# Patient Record
Sex: Female | Born: 1985 | Race: Black or African American | Hispanic: No | Smoking: Never smoker
Health system: Southern US, Community
[De-identification: ages and names within clinical notes are randomized; demographics above are authoritative.]

## PROBLEM LIST (undated history)

## (undated) DIAGNOSIS — N979 Female infertility, unspecified: Secondary | ICD-10-CM

## (undated) DIAGNOSIS — Z862 Personal history of diseases of the blood and blood-forming organs and certain disorders involving the immune mechanism: Secondary | ICD-10-CM

## (undated) DIAGNOSIS — E669 Obesity, unspecified: Secondary | ICD-10-CM

## (undated) DIAGNOSIS — E559 Vitamin D deficiency, unspecified: Secondary | ICD-10-CM

## (undated) DIAGNOSIS — T7840XA Allergy, unspecified, initial encounter: Secondary | ICD-10-CM

## (undated) DIAGNOSIS — L309 Dermatitis, unspecified: Secondary | ICD-10-CM

## (undated) HISTORY — DX: Obesity, unspecified: E66.9

## (undated) HISTORY — PX: TOOTH EXTRACTION: SUR596

## (undated) HISTORY — DX: Vitamin D deficiency, unspecified: E55.9

## (undated) HISTORY — DX: Dermatitis, unspecified: L30.9

## (undated) HISTORY — DX: Allergy, unspecified, initial encounter: T78.40XA

## (undated) HISTORY — DX: Personal history of diseases of the blood and blood-forming organs and certain disorders involving the immune mechanism: Z86.2

## (undated) HISTORY — DX: Female infertility, unspecified: N97.9

---

## 2004-02-28 ENCOUNTER — Ambulatory Visit (HOSPITAL_COMMUNITY): Admission: RE | Admit: 2004-02-28 | Discharge: 2004-02-28 | Payer: Self-pay | Admitting: Internal Medicine

## 2004-10-12 ENCOUNTER — Inpatient Hospital Stay (HOSPITAL_COMMUNITY): Admission: AD | Admit: 2004-10-12 | Discharge: 2004-10-12 | Payer: Self-pay | Admitting: Obstetrics & Gynecology

## 2005-04-05 ENCOUNTER — Inpatient Hospital Stay (HOSPITAL_COMMUNITY): Admission: AD | Admit: 2005-04-05 | Discharge: 2005-04-05 | Payer: Self-pay | Admitting: Obstetrics & Gynecology

## 2006-04-12 ENCOUNTER — Emergency Department (HOSPITAL_COMMUNITY): Admission: EM | Admit: 2006-04-12 | Discharge: 2006-04-12 | Payer: Self-pay | Admitting: Emergency Medicine

## 2006-09-12 ENCOUNTER — Emergency Department (HOSPITAL_COMMUNITY): Admission: EM | Admit: 2006-09-12 | Discharge: 2006-09-12 | Payer: Self-pay | Admitting: Family Medicine

## 2007-01-23 ENCOUNTER — Emergency Department (HOSPITAL_COMMUNITY): Admission: EM | Admit: 2007-01-23 | Discharge: 2007-01-23 | Payer: Self-pay | Admitting: Family Medicine

## 2008-02-28 ENCOUNTER — Emergency Department (HOSPITAL_COMMUNITY): Admission: EM | Admit: 2008-02-28 | Discharge: 2008-02-29 | Payer: Self-pay | Admitting: Emergency Medicine

## 2008-05-21 ENCOUNTER — Emergency Department (HOSPITAL_COMMUNITY): Admission: EM | Admit: 2008-05-21 | Discharge: 2008-05-21 | Payer: Self-pay | Admitting: Emergency Medicine

## 2008-10-14 ENCOUNTER — Emergency Department (HOSPITAL_COMMUNITY): Admission: EM | Admit: 2008-10-14 | Discharge: 2008-10-14 | Payer: Self-pay | Admitting: Family Medicine

## 2008-12-23 ENCOUNTER — Emergency Department (HOSPITAL_COMMUNITY): Admission: EM | Admit: 2008-12-23 | Discharge: 2008-12-23 | Payer: Self-pay | Admitting: Emergency Medicine

## 2009-03-28 ENCOUNTER — Emergency Department (HOSPITAL_COMMUNITY): Admission: EM | Admit: 2009-03-28 | Discharge: 2009-03-28 | Payer: Self-pay | Admitting: Emergency Medicine

## 2009-06-29 ENCOUNTER — Emergency Department (HOSPITAL_COMMUNITY): Admission: EM | Admit: 2009-06-29 | Discharge: 2009-06-29 | Payer: Self-pay | Admitting: Emergency Medicine

## 2009-07-10 ENCOUNTER — Inpatient Hospital Stay (HOSPITAL_COMMUNITY): Admission: AD | Admit: 2009-07-10 | Discharge: 2009-07-10 | Payer: Self-pay | Admitting: Obstetrics and Gynecology

## 2009-07-22 ENCOUNTER — Inpatient Hospital Stay (HOSPITAL_COMMUNITY): Admission: AD | Admit: 2009-07-22 | Discharge: 2009-07-23 | Payer: Self-pay | Admitting: Obstetrics and Gynecology

## 2009-08-27 ENCOUNTER — Inpatient Hospital Stay (HOSPITAL_COMMUNITY): Admission: AD | Admit: 2009-08-27 | Discharge: 2009-09-01 | Payer: Self-pay | Admitting: Obstetrics and Gynecology

## 2010-08-30 LAB — CBC
HCT: 30.8 % — ABNORMAL LOW (ref 36.0–46.0)
HCT: 36.5 % (ref 36.0–46.0)
Hemoglobin: 12 g/dL (ref 12.0–15.0)
MCHC: 33.2 g/dL (ref 30.0–36.0)
MCV: 82.9 fL (ref 78.0–100.0)
Platelets: 201 10*3/uL (ref 150–400)
RBC: 4.43 MIL/uL (ref 3.87–5.11)
RDW: 14.8 % (ref 11.5–15.5)
RDW: 14.8 % (ref 11.5–15.5)
WBC: 17.7 10*3/uL — ABNORMAL HIGH (ref 4.0–10.5)

## 2010-08-30 LAB — RPR: RPR Ser Ql: NONREACTIVE

## 2010-09-13 LAB — POCT URINALYSIS DIP (DEVICE)
Glucose, UA: NEGATIVE mg/dL
Hgb urine dipstick: NEGATIVE

## 2010-10-08 ENCOUNTER — Emergency Department (HOSPITAL_COMMUNITY)
Admission: EM | Admit: 2010-10-08 | Discharge: 2010-10-08 | Disposition: A | Discharge status: Home / Self Care | Payer: Self-pay | Attending: Emergency Medicine | Admitting: Emergency Medicine

## 2010-10-08 ENCOUNTER — Emergency Department (HOSPITAL_COMMUNITY): Payer: Self-pay

## 2010-10-08 DIAGNOSIS — M25539 Pain in unspecified wrist: Secondary | ICD-10-CM | POA: Insufficient documentation

## 2010-10-08 DIAGNOSIS — M25439 Effusion, unspecified wrist: Secondary | ICD-10-CM | POA: Insufficient documentation

## 2011-03-08 LAB — COMPREHENSIVE METABOLIC PANEL
AST: 17
Alkaline Phosphatase: 58
Chloride: 103
Creatinine, Ser: 0.84
GFR calc Af Amer: >60
Potassium: 3.3 — ABNORMAL LOW
Sodium: 137
Total Bilirubin: 1

## 2011-03-08 LAB — CBC
Hemoglobin: 12.6
MCHC: 33
MCV: 85.1
RDW: 14.7

## 2011-03-08 LAB — WET PREP, GENITAL
Clue Cells Wet Prep HPF POC: NONE SEEN
Yeast Wet Prep HPF POC: NONE SEEN

## 2011-03-08 LAB — DIFFERENTIAL
Basophils Absolute: 0
Basophils Relative: 0
Lymphocytes Relative: 10 — ABNORMAL LOW
Monocytes Absolute: 0.8
Monocytes Relative: 12
Neutro Abs: 4.9

## 2011-03-08 LAB — URINALYSIS, ROUTINE W REFLEX MICROSCOPIC
Bilirubin Urine: NEGATIVE
Glucose, UA: NEGATIVE
Hgb urine dipstick: NEGATIVE
Ketones, ur: NEGATIVE
Protein, ur: NEGATIVE
Specific Gravity, Urine: 1.025
pH: 6

## 2011-03-08 LAB — GC/CHLAMYDIA PROBE AMP, GENITAL: Chlamydia, DNA Probe: NEGATIVE

## 2011-03-12 LAB — DIFFERENTIAL
Basophils Absolute: 0 K/uL (ref 0.0–0.1)
Basophils Relative: 1 % (ref 0–1)
Eosinophils Absolute: 0 K/uL (ref 0.0–0.7)
Eosinophils Relative: 1 % (ref 0–5)
Lymphocytes Relative: 10 % — ABNORMAL LOW (ref 12–46)
Lymphs Abs: 0.7 10*3/uL (ref 0.7–4.0)
Monocytes Absolute: 0.4 K/uL (ref 0.1–1.0)
Monocytes Relative: 6 % (ref 3–12)
Neutro Abs: 5.6 10*3/uL (ref 1.7–7.7)
Neutrophils Relative %: 83 % — ABNORMAL HIGH (ref 43–77)

## 2011-03-12 LAB — COMPREHENSIVE METABOLIC PANEL WITH GFR
AST: 22 U/L (ref 0–37)
Albumin: 4 g/dL (ref 3.5–5.2)
BUN: 6 mg/dL (ref 6–23)
Calcium: 9.2 mg/dL (ref 8.4–10.5)
Creatinine, Ser: 0.89 mg/dL (ref 0.4–1.2)
GFR calc non Af Amer: >60 mL/min (ref >60)
Glucose, Bld: 100 mg/dL — ABNORMAL HIGH (ref 70–99)
Potassium: 3.6 meq/L (ref 3.5–5.1)
Total Bilirubin: 1.1 mg/dL (ref 0.3–1.2)
Total Protein: 7.6 g/dL (ref 6.0–8.3)

## 2011-03-12 LAB — URINALYSIS, ROUTINE W REFLEX MICROSCOPIC
Bilirubin Urine: NEGATIVE
Glucose, UA: NEGATIVE mg/dL
Hgb urine dipstick: NEGATIVE
Ketones, ur: 15 mg/dL — AB
Nitrite: NEGATIVE
Protein, ur: NEGATIVE mg/dL
Specific Gravity, Urine: 1.026 (ref 1.005–1.030)
Urobilinogen, UA: 1 mg/dL (ref 0.0–1.0)
pH: 6.5 (ref 5.0–8.0)

## 2011-03-12 LAB — CBC
HCT: 39 % (ref 36.0–46.0)
Hemoglobin: 12.8 g/dL (ref 12.0–15.0)
MCHC: 32.7 g/dL (ref 30.0–36.0)
MCV: 83.5 fL (ref 78.0–100.0)
Platelets: 294 10*3/uL (ref 150–400)
RBC: 4.67 MIL/uL (ref 3.87–5.11)
RDW: 14.6 % (ref 11.5–15.5)
WBC: 6.7 10*3/uL (ref 4.0–10.5)

## 2011-03-12 LAB — COMPREHENSIVE METABOLIC PANEL
ALT: 25 U/L (ref 0–35)
Alkaline Phosphatase: 65 U/L (ref 39–117)
CO2: 27 mEq/L (ref 19–32)
Chloride: 104 mEq/L (ref 96–112)
GFR calc Af Amer: >60 mL/min (ref >60)
Sodium: 139 mEq/L (ref 135–145)

## 2011-03-12 LAB — POCT PREGNANCY, URINE: Preg Test, Ur: NEGATIVE

## 2011-03-12 LAB — LIPASE, BLOOD: Lipase: 25 U/L (ref 11–59)

## 2011-03-19 LAB — POCT URINALYSIS DIP (DEVICE)
Bilirubin Urine: NEGATIVE
Hgb urine dipstick: NEGATIVE
Ketones, ur: NEGATIVE
Specific Gravity, Urine: 1.02
pH: 7

## 2011-03-19 LAB — WET PREP, GENITAL
Trich, Wet Prep: NONE SEEN
Yeast Wet Prep HPF POC: NONE SEEN

## 2011-03-19 LAB — POCT PREGNANCY, URINE: Operator id: 116391

## 2011-03-19 LAB — GC/CHLAMYDIA PROBE AMP, GENITAL: GC Probe Amp, Genital: NEGATIVE

## 2012-03-21 ENCOUNTER — Encounter (HOSPITAL_COMMUNITY): Payer: Self-pay | Admitting: Emergency Medicine

## 2012-03-21 ENCOUNTER — Emergency Department (HOSPITAL_COMMUNITY)
Admission: EM | Admit: 2012-03-21 | Discharge: 2012-03-21 | Disposition: A | Discharge status: Home / Self Care | Payer: Self-pay | Attending: Emergency Medicine | Admitting: Emergency Medicine

## 2012-03-21 ENCOUNTER — Emergency Department (HOSPITAL_COMMUNITY): Payer: Self-pay

## 2012-03-21 DIAGNOSIS — R109 Unspecified abdominal pain: Secondary | ICD-10-CM | POA: Insufficient documentation

## 2012-03-21 DIAGNOSIS — R1010 Upper abdominal pain, unspecified: Secondary | ICD-10-CM

## 2012-03-21 DIAGNOSIS — R197 Diarrhea, unspecified: Secondary | ICD-10-CM | POA: Insufficient documentation

## 2012-03-21 DIAGNOSIS — R112 Nausea with vomiting, unspecified: Secondary | ICD-10-CM | POA: Insufficient documentation

## 2012-03-21 DIAGNOSIS — R10816 Epigastric abdominal tenderness: Secondary | ICD-10-CM | POA: Insufficient documentation

## 2012-03-21 LAB — CBC WITH DIFFERENTIAL/PLATELET
Basophils Absolute: 0 K/uL (ref 0.0–0.1)
Basophils Relative: 0 % (ref 0–1)
Eosinophils Absolute: 0.1 K/uL (ref 0.0–0.7)
Eosinophils Relative: 1 % (ref 0–5)
HCT: 40.9 % (ref 36.0–46.0)
Hemoglobin: 13.8 g/dL (ref 12.0–15.0)
Lymphocytes Relative: 12 % (ref 12–46)
Lymphs Abs: 1.6 K/uL (ref 0.7–4.0)
MCH: 27 pg (ref 26.0–34.0)
MCHC: 33.7 g/dL (ref 30.0–36.0)
MCV: 79.9 fL (ref 78.0–100.0)
Monocytes Absolute: 0.7 K/uL (ref 0.1–1.0)
Monocytes Relative: 5 % (ref 3–12)
Neutro Abs: 11.1 K/uL — ABNORMAL HIGH (ref 1.7–7.7)
Neutrophils Relative %: 82 % — ABNORMAL HIGH (ref 43–77)
Platelets: 327 K/uL (ref 150–400)
RBC: 5.12 MIL/uL — ABNORMAL HIGH (ref 3.87–5.11)
RDW: 13.8 % (ref 11.5–15.5)
WBC: 13.5 K/uL — ABNORMAL HIGH (ref 4.0–10.5)

## 2012-03-21 LAB — COMPREHENSIVE METABOLIC PANEL
Albumin: 4.3 g/dL (ref 3.5–5.2)
Alkaline Phosphatase: 84 U/L (ref 39–117)
BUN: 8 mg/dL (ref 6–23)
Calcium: 9.9 mg/dL (ref 8.4–10.5)
Creatinine, Ser: 0.84 mg/dL (ref 0.50–1.10)
GFR calc Af Amer: >90 mL/min (ref >90)
Glucose, Bld: 96 mg/dL (ref 70–99)
Total Protein: 8.7 g/dL — ABNORMAL HIGH (ref 6.0–8.3)

## 2012-03-21 LAB — URINALYSIS, ROUTINE W REFLEX MICROSCOPIC
Glucose, UA: NEGATIVE mg/dL
Ketones, ur: NEGATIVE mg/dL
Leukocytes, UA: NEGATIVE
Nitrite: NEGATIVE
pH: 5.5 (ref 5.0–8.0)

## 2012-03-21 LAB — URINE MICROSCOPIC-ADD ON

## 2012-03-21 MED ORDER — ONDANSETRON HCL 4 MG PO TABS: 4.0000 mg | ORAL_TABLET | Freq: Three times a day (TID) | ORAL | Status: DC | PRN

## 2012-03-21 NOTE — ED Provider Notes (Signed)
10:29 PM Patient to move to CDU following abdominal US to await results.  Pt with two weeks of N/V/D.  Holding pending [place].  If negative, patient may be discharged home with PCP follow up.    10:49 PM Patient reports she is feeling much better.  States her abdominal pain is now 3/10, much improved.  She is no longer nauseated.  Reports "my butt hurts" from having diarrhea.  Labs show leukocytosis, otherwise unremarkable.  UA with many squamous cells, 0-2WBC, few bacteria, 30 protein.    11:11 PM Reexamination of abdomen: soft, nondistended, diffuse upper abdominal pain, no guarding, no rebound.  Pt to be d/c home with zofran, PCP follow up.    Pt verbalizes understanding and agrees with plan.   Pt given return precautions.   Results for orders placed during the hospital encounter of 03/21/12  CBC WITH DIFFERENTIAL      Component Value Range   WBC 13.5 (*) 4.0 - 10.5 K/uL   RBC 5.12 (*) 3.87 - 5.11 MIL/uL   Hemoglobin 13.8  12.0 - 15.0 g/dL   HCT 95.2  84.1 - 32.4 %   MCV 79.9  78.0 - 100.0 fL   MCH 27.0  26.0 - 34.0 pg   MCHC 33.7  30.0 - 36.0 g/dL   RDW 40.1  02.7 - 25.3 %   Platelets 327  150 - 400 K/uL   Neutrophils Relative 82 (*) 43 - 77 %   Neutro Abs 11.1 (*) 1.7 - 7.7 K/uL   Lymphocytes Relative 12  12 - 46 %   Lymphs Abs 1.6  0.7 - 4.0 K/uL   Monocytes Relative 5  3 - 12 %   Monocytes Absolute 0.7  0.1 - 1.0 K/uL   Eosinophils Relative 1  0 - 5 %   Eosinophils Absolute 0.1  0.0 - 0.7 K/uL   Basophils Relative 0  0 - 1 %   Basophils Absolute 0.0  0.0 - 0.1 K/uL  COMPREHENSIVE METABOLIC PANEL      Component Value Range   Sodium 137  135 - 145 mEq/L   Potassium 3.7  3.5 - 5.1 mEq/L   Chloride 102  96 - 112 mEq/L   CO2 26  19 - 32 mEq/L   Glucose, Bld 96  70 - 99 mg/dL   BUN 8  6 - 23 mg/dL   Creatinine, Ser 6.64  0.50 - 1.10 mg/dL   Calcium 9.9  8.4 - 40.3 mg/dL   Total Protein 8.7 (*) 6.0 - 8.3 g/dL   Albumin 4.3  3.5 - 5.2 g/dL   AST 16  0 - 37 U/L   ALT 14  0 - 35 U/L    Alkaline Phosphatase 84  39 - 117 U/L   Total Bilirubin 0.7  0.3 - 1.2 mg/dL   GFR calc non Af Amer >90  >90 mL/min   GFR calc Af Amer >90  >90 mL/min  URINALYSIS, ROUTINE W REFLEX MICROSCOPIC      Component Value Range   Color, Urine YELLOW  YELLOW   APPearance CLEAR  CLEAR   Specific Gravity, Urine 1.034 (*) 1.005 - 1.030   pH 5.5  5.0 - 8.0   Glucose, UA NEGATIVE  NEGATIVE mg/dL   Hgb urine dipstick NEGATIVE  NEGATIVE   Bilirubin Urine NEGATIVE  NEGATIVE   Ketones, ur NEGATIVE  NEGATIVE mg/dL   Protein, ur 30 (*) NEGATIVE mg/dL   Urobilinogen, UA 0.2  0.0 - 1.0 mg/dL  Nitrite NEGATIVE  NEGATIVE   Leukocytes, UA NEGATIVE  NEGATIVE  URINE MICROSCOPIC-ADD ON      Component Value Range   Squamous Epithelial / LPF MANY (*) RARE   WBC, UA 0-2  <3 WBC/hpf   Bacteria, UA FEW (*) RARE   Urine-Other MUCOUS PRESENT     US Abdomen Complete  03/21/2012  *RADIOLOGY REPORT*  Clinical Data:  Abdominal pain.  COMPLETE ABDOMINAL ULTRASOUND  Comparison:  CT abdomen and pelvis 02/28/2008  Findings:  Gallbladder:  No gallstones, gallbladder wall thickening, or pericholecystic fluid.  Common bile duct:  Common bile duct is not visualized due to overlying bowel gas.  No intrahepatic bile duct dilatation is appreciated.  Liver:  No focal lesion identified.  Within normal limits in parenchymal echogenicity.  IVC:  Appears normal.  Pancreas:  Pancreas is not visualized due to overlying bowel gas.  Spleen:  Spleen length measures 11.2 cm.  Normal parenchymal echotexture.  Right Kidney:  Right kidney measures 11.5 cm length.  No hydronephrosis.  Left Kidney:  Left kidney measures 11.6 cm length.  No hydronephrosis.  Abdominal aorta:  No aneurysm identified.  IMPRESSION: Limited visualization of common bile duct and pancreas due to overlying bowel gas.  Visualized upper abdominal structures are unremarkable.   Original Report Authenticated By: Marlon Pel, M.D.       [place], [place] 03/21/12 2312

## 2012-03-21 NOTE — ED Provider Notes (Signed)
History     CSN: [id number]  Arrival date & time 03/21/12  1722   First MD Initiated Contact with Patient 03/21/12 2028      Chief Complaint  Patient presents with  . Diarrhea  . Nausea  . Emesis    (Consider location/radiation/quality/duration/timing/severity/associated sxs/prior treatment) HPI Comments: Patient presents with n/v/d for the past 1-2 weeks.  She states she is having upper abd pain and is having a difficult time keeping anything down.  There are no ill contacts.  She denies having had prior abd surgeries with the exception of a c-section.    Patient is a 26 y.o. female presenting with diarrhea. The history is provided by the patient.  Diarrhea The primary symptoms include abdominal pain, nausea, vomiting and diarrhea. Primary symptoms do not include fever or dysuria. Episode onset: 1-2 weeks ago. The problem has been gradually worsening.  The illness does not include chills or constipation.    History reviewed. No pertinent past medical history.  Past Surgical History  Procedure Date  . Cesarean section     No family history on file.  History  Substance Use Topics  . Smoking status: Never Smoker   . Smokeless tobacco: Not on file  . Alcohol Use: No    OB History    Grav Para Term Preterm Abortions TAB SAB Ect Mult Living                  Review of Systems  Constitutional: Negative for fever and chills.  Gastrointestinal: Positive for nausea, vomiting, abdominal pain and diarrhea. Negative for constipation.  Genitourinary: Negative for dysuria.  All other systems reviewed and are negative.    Allergies  Review of patient's allergies indicates no known allergies.  Home Medications  No current outpatient prescriptions on file.  BP 123/88  Pulse 107  Temp 98.4 F (36.9 C) (Oral)  Resp 16  SpO2 99%  LMP 03/08/2012  Physical Exam  Nursing note and vitals reviewed. Constitutional: She is oriented to person, place, and time. She appears  well-developed and well-nourished. No distress.  HENT:  Head: Normocephalic and atraumatic.  Neck: Normal range of motion. Neck supple.  Cardiovascular: Normal rate and regular rhythm.  Exam reveals no gallop and no friction rub.   No murmur heard. Pulmonary/Chest: Effort normal and breath sounds normal. No respiratory distress. She has no wheezes.  Abdominal: Soft. Bowel sounds are normal. She exhibits no distension. There is no tenderness.       There is mild ttp in the epigastrium without rebound or guarding.    Musculoskeletal: Normal range of motion.  Neurological: She is alert and oriented to person, place, and time.  Skin: Skin is warm and dry. She is not diaphoretic.    ED Course  Procedures (including critical care time)  Labs Reviewed  CBC WITH DIFFERENTIAL - Abnormal; Notable for the following:    WBC 13.5 (*)     RBC 5.12 (*)     Neutrophils Relative 82 (*)     Neutro Abs 11.1 (*)     All other components within normal limits  COMPREHENSIVE METABOLIC PANEL - Abnormal; Notable for the following:    Total Protein 8.7 (*)     All other components within normal limits  URINALYSIS, ROUTINE W REFLEX MICROSCOPIC - Abnormal; Notable for the following:    Specific Gravity, Urine 1.034 (*)     Protein, ur 30 (*)     All other components within normal limits  URINE  MICROSCOPIC-ADD ON - Abnormal; Notable for the following:    Squamous Epithelial / LPF MANY (*)     Bacteria, UA FEW (*)     All other components within normal limits   No results found.   No diagnosis found.    MDM  The patient presents here with epigastric abd pain.  Her labs and vitals are unremarkable.  An [place] was ordered and the patient was sent to the cdu awaiting the [place] results.  The provider in the cdu will obtain the results.  If normal she will be discharged to home.        Geoffery Lyons, MD 03/22/12 [phone]

## 2012-03-21 NOTE — ED Notes (Signed)
Pt states she has had diarrhea with nausea and vomiting for past two weeks.  Denies abdominal pain.  States has had some dizziness.

## 2012-03-22 NOTE — ED Provider Notes (Signed)
Medical screening examination/treatment/procedure(s) were performed by non-physician practitioner and as supervising physician I was immediately available for consultation/collaboration.  Geoffery Lyons, MD 03/22/12 [phone]

## 2012-06-27 ENCOUNTER — Ambulatory Visit (INDEPENDENT_AMBULATORY_CARE_PROVIDER_SITE_OTHER): Payer: Medicaid Other | Admitting: Family Medicine

## 2012-06-27 ENCOUNTER — Encounter: Payer: Self-pay | Admitting: Family Medicine

## 2012-06-27 VITALS — BP 110/77 | HR 89 | Temp 98.2°F | Ht 64.5 in | Wt 250.0 lb

## 2012-06-27 DIAGNOSIS — E669 Obesity, unspecified: Secondary | ICD-10-CM | POA: Insufficient documentation

## 2012-06-27 DIAGNOSIS — Z7689 Persons encountering health services in other specified circumstances: Secondary | ICD-10-CM

## 2012-06-27 DIAGNOSIS — Z01419 Encounter for gynecological examination (general) (routine) without abnormal findings: Secondary | ICD-10-CM | POA: Insufficient documentation

## 2012-06-27 DIAGNOSIS — Z7189 Other specified counseling: Secondary | ICD-10-CM

## 2012-06-27 NOTE — Assessment & Plan Note (Signed)
Reviewed PMHX, SurHx, FamHx, SocHx.  Patient is due for pap/CPE, and would like Mirena removed, will have her come in at he convenience for Well Woman Exam.

## 2012-06-27 NOTE — Progress Notes (Signed)
  Subjective:    Patient ID: Lauren Powell, female    DOB: Aug 14, 1985, 27 y.o.   MRN: [id number]  HPI  Krista comes in to establish care.  She has no specific complaints.  She is getting married February 22, is not currently sexually active.  She and her fiance want to try to get pregnant as soon as they get married, so she is planning on having her IUD out. She has not had a pap since her first pregnancy 3 years ago.   She says [facility] is [facility], doing some aerobics classes, and walking a lot.  She has made some dietary changes as well.    Past Medical History  Diagnosis Date  . Allergy   . Eczema    Family History  Problem Relation Age of Onset  . Hypertension Paternal Aunt   . Hypertension Paternal Grandmother    History   Social History  . Marital Status: Single    Spouse Name: N/A    Number of Children: N/A  . Years of Education: N/A   Occupational History  . Not on file.   Social History Main Topics  . Smoking status: Never Smoker   . Smokeless tobacco: Never Used  . Alcohol Use: No  . Drug Use: No  . Sexually Active: Not Currently    Birth Control/ Protection: IUD   Other Topics Concern  . 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    Review of Systems 12-point ROS negative except stated in HPI    Objective:   Physical Exam BP 110/77  Pulse 89  Temp 98.2 F (36.8 C)  Ht 5' 4.5" (1.638 m)  Wt 250 lb (113.399 kg)  BMI 42.25 kg/m2  LMP 06/13/2012 General appearance: alert, cooperative, no distress and moderately obese Neck: no adenopathy, supple, symmetrical, trachea midline and thyroid not enlarged, symmetric, no tenderness/mass/nodules Lungs: clear to auscultation bilaterally Heart: regular rate and rhythm, S1, S2 normal, no murmur, click, rub or gallop Extremities: extremities normal, atraumatic, no cyanosis or edema Pulses: 2+ and  symmetric      Assessment & Plan:

## 2012-06-27 NOTE — Assessment & Plan Note (Signed)
Discussed dietary changes, gave a few suggestions for changes to make.  Offered nutrition counseling if she is interested.

## 2012-06-27 NOTE — Patient Instructions (Signed)
It was nice to meet you.  Your blood pressure today was BP: 110/77 mmHg.  Remember your goal blood pressure is about 130/80.  Please be sure to take your medication every day.    To help you work on improving your nutrition, remember the plate rule for each meal:  - 1/4 of the plate or less should be a whole grain starch (brown rice, whole grain pasta, wheat bread, etc.)  - 1/4 of the plate should be a lean source of protein (chicken, [place], fish, beans, egg whites).  - 1/2 the plate or more should be fruits and vegetables - the more vegetables the better!

## 2012-07-07 ENCOUNTER — Encounter: Payer: Medicaid Other | Admitting: Family Medicine

## 2012-07-18 ENCOUNTER — Other Ambulatory Visit (HOSPITAL_COMMUNITY)
Admission: RE | Admit: 2012-07-18 | Discharge: 2012-07-18 | Disposition: A | Discharge status: Home / Self Care | Payer: Medicaid Other | Source: Ambulatory Visit | Attending: Family Medicine | Admitting: Family Medicine

## 2012-07-18 ENCOUNTER — Encounter: Payer: Self-pay | Admitting: Family Medicine

## 2012-07-18 ENCOUNTER — Ambulatory Visit (INDEPENDENT_AMBULATORY_CARE_PROVIDER_SITE_OTHER): Payer: Medicaid Other | Admitting: Family Medicine

## 2012-07-18 VITALS — BP 118/79 | HR 79 | Temp 98.0°F | Ht 64.5 in | Wt 249.0 lb

## 2012-07-18 DIAGNOSIS — Z309 Encounter for contraceptive management, unspecified: Secondary | ICD-10-CM

## 2012-07-18 DIAGNOSIS — M79609 Pain in unspecified limb: Secondary | ICD-10-CM

## 2012-07-18 DIAGNOSIS — E669 Obesity, unspecified: Secondary | ICD-10-CM

## 2012-07-18 DIAGNOSIS — Z124 Encounter for screening for malignant neoplasm of cervix: Secondary | ICD-10-CM

## 2012-07-18 DIAGNOSIS — Z01419 Encounter for gynecological examination (general) (routine) without abnormal findings: Secondary | ICD-10-CM

## 2012-07-18 DIAGNOSIS — M79671 Pain in right foot: Secondary | ICD-10-CM | POA: Insufficient documentation

## 2012-07-18 MED ORDER — SPENCO GEL HEEL CUP MED/LG MISC: 1.0000 | Freq: Once | Status: DC

## 2012-07-18 MED ORDER — TRIAMCINOLONE ACETONIDE 0.025 % EX OINT: TOPICAL_OINTMENT | Freq: Two times a day (BID) | CUTANEOUS | Status: DC

## 2012-07-18 MED ORDER — MELOXICAM 15 MG PO TABS: 15.0000 mg | ORAL_TABLET | Freq: Every day | ORAL | Status: DC

## 2012-07-18 NOTE — Assessment & Plan Note (Signed)
Encouraged her to continue to make lifestyle changes- offered nutrition counseling.

## 2012-07-18 NOTE — Assessment & Plan Note (Signed)
Likely plantar fascitis,  Rx for heel cups, gave hand out from sports medicine patient advisor with home exercise program.  rx for Mobic and advised to ice BID. F/u in one month if not improving.

## 2012-07-18 NOTE — Assessment & Plan Note (Signed)
Pap done, IUD removed, discussed importance of exercise, prenatal vitamin, continuing to abstain from smoking, drugs, alcohol when trying to become pregnant.

## 2012-07-18 NOTE — Patient Instructions (Signed)
It was good to see you today.  Congratulations on your up coming marriage!  I will send you a letter with your lab results, or call you if anything is abnormal.    For the heel pain, please take the meloxicam once daily with food for one to two weeks, and then as needed.  I also suggest icing your heel for 20 minutes at least twice a day.  Also, please see the attached hand out with heel stretches and exercises.

## 2012-07-18 NOTE — Progress Notes (Signed)
  Subjective:    Patient ID: Lauren Powell, female    DOB: 1986/01/12, 27 y.o.   MRN: [id number]  HPI  [place] comes in for her well woman exam, and to have her IUD removed.  She is getting married and wants to become pregnant.  She has not had intercourse in 3 years since her son was born, and has no worries about STD's.  She is due for a pap.  She has never had an abnormal pap.   Obesity- has been trying to watch diet, and get exercise but stressed with wedding planning.    Heel pain- has been going on since October, hurts on and off, first step out of bed, and at the end of a work shift.  She wears tennis shoes to work.  She has not had an injury, not tried any treatment for this.   Past Medical History  Diagnosis Date  . Allergy   . Eczema    Family History  Problem Relation Age of Onset  . Hypertension Paternal Aunt   . Hypertension Paternal Grandmother    History  Substance Use Topics  . Smoking status: Never Smoker   . Smokeless tobacco: Never Used  . Alcohol Use: No    Review of Systems 12 point ROS negative except for above.     Objective:   Physical Exam BP 118/79  Pulse 79  Temp(Src) 98 F (36.7 C) (Oral)  Ht 5' 4.5" (1.638 m)  Wt 249 lb (112.946 kg)  BMI 42.1 kg/m2  LMP 06/13/2012 General appearance: alert, cooperative, no distress and moderately obese Lungs: clear to auscultation bilaterally Heart: regular rate and rhythm, S1, S2 normal, no murmur, click, rub or gallop Pelvic: cervix normal in appearance, external genitalia normal, no adnexal masses or tenderness, no cervical motion tenderness, rectovaginal septum normal, uterus normal size, shape, and consistency, vagina normal without discharge and IUD strings easily visible.  Extremities: extremities normal, atraumatic, no cyanosis or edema Pulses: 2+ and symmetric  Procedure: IUD removal Speculum easily placed and Mirena strings visible.  Ringed forceps used to grasp strings, mirena removed without  difficulty.  Patient experienced minimal discomfort, no bleeding or complications.      Assessment & Plan:

## 2012-07-24 ENCOUNTER — Encounter: Payer: Self-pay | Admitting: Family Medicine

## 2012-09-03 ENCOUNTER — Encounter (HOSPITAL_COMMUNITY): Payer: Self-pay

## 2012-09-03 ENCOUNTER — Emergency Department (HOSPITAL_COMMUNITY)
Admission: EM | Admit: 2012-09-03 | Discharge: 2012-09-03 | Disposition: A | Discharge status: Home / Self Care | Payer: Medicaid Other | Attending: Emergency Medicine | Admitting: Emergency Medicine

## 2012-09-03 DIAGNOSIS — N926 Irregular menstruation, unspecified: Secondary | ICD-10-CM

## 2012-09-03 DIAGNOSIS — N644 Mastodynia: Secondary | ICD-10-CM | POA: Insufficient documentation

## 2012-09-03 DIAGNOSIS — Z3202 Encounter for pregnancy test, result negative: Secondary | ICD-10-CM | POA: Insufficient documentation

## 2012-09-03 DIAGNOSIS — Z872 Personal history of diseases of the skin and subcutaneous tissue: Secondary | ICD-10-CM | POA: Insufficient documentation

## 2012-09-03 NOTE — ED Provider Notes (Signed)
History  This chart was scribed for non-physician practitioner, Jaci Carrel, PA-C working with Carleene Cooper III, MD by Shari Heritage, ED Scribe. This patient was seen in room TR07C/TR07C and the patient's care was started at 2138.   CSN: [id number]  Arrival date & time 09/03/12  4098   First MD Initiated Contact with Patient 09/03/12 2138      Chief Complaint  Patient presents with  . Possible Pregnancy    The history is provided by the patient. No language interpreter was used.     HPI Comments: Lauren Powell is a 27 y.o. female who presents to the Emergency Department with concern of possible pregnancy. Patient states that her LMP was 07/31/2012 and is late. She says that her menstrual cycles are very regular. She is not using any birth control and has had unprotected sex. She also reports increased breast tenderness. Patient has taken 3 pregnancy tests at home which were all negative. She denies nausea, vaginal discharge, abdominal pain, fever, nipple discharge or diaphoresis. She has no other complaints or concerns at this time. Patient does not smoke.   Past Medical History  Diagnosis Date  . Allergy   . Eczema     Past Surgical History  Procedure Laterality Date  . Cesarean section      Family History  Problem Relation Age of Onset  . Hypertension Paternal Aunt   . Hypertension Paternal Grandmother     History  Substance Use Topics  . Smoking status: Never Smoker   . Smokeless tobacco: Never Used  . Alcohol Use: No    OB History   Grav Para Term Preterm Abortions TAB SAB Ect Mult Living                  Review of Systems A complete 10 system review of systems was obtained and all systems are negative except as noted in the HPI and PMH.   Allergies  Review of patient's allergies indicates no known allergies.  Home Medications  No current outpatient prescriptions on file.  Triage Vitals: BP 115/78  Pulse 82  Temp(Src) 96.9 F (36.1 C) (Oral)  Resp  18  SpO2 100%  LMP 07/31/2012  Physical Exam  Nursing note and vitals reviewed. Constitutional: She is oriented to person, place, and time. She appears well-developed and well-nourished. No distress.  HENT:  Head: Normocephalic and atraumatic.  Eyes: Conjunctivae and EOM are normal.  Neck: Normal range of motion.  Pulmonary/Chest: Effort normal.  Musculoskeletal: Normal range of motion.  Neurological: She is alert and oriented to person, place, and time.  Skin: Skin is warm and dry. No rash noted. She is not diaphoretic.  Psychiatric: She has a normal mood and affect. Her behavior is normal.    ED Course  Procedures (including critical care time) DIAGNOSTIC STUDIES: Oxygen Saturation is 100% on room air, normal by my interpretation.    COORDINATION OF CARE: 9:56 PM- Patient informed of current plan for treatment and evaluation and agrees with plan at this time.    Results for orders placed during the hospital encounter of 09/03/12  POCT PREGNANCY, URINE      Result Value Range   Preg Test, Ur NEGATIVE  NEGATIVE    No diagnosis found.    MDM  Late periord, neg preg. f-u w OBGYN       I personally performed the services described in this documentation, which was scribed in my presence. The recorded information has been reviewed and is accurate.  Jaci Carrel, [place] 09/03/12 2246

## 2012-09-03 NOTE — ED Notes (Signed)
Pt in stating she is concerned she is pregnant, states her cycle is always on time and it is now 5 days late, negative urine pregnancy tests at home, also c/o breast soreness and intermittent nausea, states these symptoms are similar to her last pregnancy.

## 2012-09-03 NOTE — ED Notes (Signed)
Patient presents with possible pregnancy. Reports that she is "regualr" and "never misses a beat" in regards to her menses. Patient is not on any birth control and reports having unprotected sex. LMP 07/31/12. Endorses intermittent headaches. Denies nausea, vomiting or abdominal pain.

## 2012-09-04 NOTE — ED Provider Notes (Signed)
Medical screening examination/treatment/procedure(s) were performed by non-physician practitioner and as supervising physician I was immediately available for consultation/collaboration.   Carleene Cooper III, MD 09/04/12 [phone]

## 2012-11-15 ENCOUNTER — Other Ambulatory Visit: Payer: Self-pay

## 2012-11-15 ENCOUNTER — Emergency Department (HOSPITAL_COMMUNITY)
Admission: EM | Admit: 2012-11-15 | Discharge: 2012-11-15 | Disposition: A | Discharge status: Home / Self Care | Payer: Medicaid Other | Attending: Emergency Medicine | Admitting: Emergency Medicine

## 2012-11-15 ENCOUNTER — Encounter (HOSPITAL_COMMUNITY): Payer: Self-pay | Admitting: *Deleted

## 2012-11-15 ENCOUNTER — Emergency Department (HOSPITAL_COMMUNITY): Payer: Medicaid Other

## 2012-11-15 DIAGNOSIS — Z872 Personal history of diseases of the skin and subcutaneous tissue: Secondary | ICD-10-CM | POA: Insufficient documentation

## 2012-11-15 DIAGNOSIS — Z79899 Other long term (current) drug therapy: Secondary | ICD-10-CM | POA: Insufficient documentation

## 2012-11-15 DIAGNOSIS — J309 Allergic rhinitis, unspecified: Secondary | ICD-10-CM | POA: Insufficient documentation

## 2012-11-15 DIAGNOSIS — R0789 Other chest pain: Secondary | ICD-10-CM | POA: Insufficient documentation

## 2012-11-15 DIAGNOSIS — J302 Other seasonal allergic rhinitis: Secondary | ICD-10-CM

## 2012-11-15 DIAGNOSIS — Z3202 Encounter for pregnancy test, result negative: Secondary | ICD-10-CM | POA: Insufficient documentation

## 2012-11-15 DIAGNOSIS — R079 Chest pain, unspecified: Secondary | ICD-10-CM

## 2012-11-15 LAB — CBC
HCT: 36.9 % (ref 36.0–46.0)
MCH: 26.8 pg (ref 26.0–34.0)
MCV: 80.4 fL (ref 78.0–100.0)
Platelets: 287 10*3/uL (ref 150–400)
RBC: 4.59 MIL/uL (ref 3.87–5.11)
RDW: 13.8 % (ref 11.5–15.5)
WBC: 7.2 10*3/uL (ref 4.0–10.5)

## 2012-11-15 LAB — BASIC METABOLIC PANEL
BUN: 5 mg/dL — ABNORMAL LOW (ref 6–23)
CO2: 29 mEq/L (ref 19–32)
Calcium: 9 mg/dL (ref 8.4–10.5)
Chloride: 101 mEq/L (ref 96–112)
Creatinine, Ser: 0.82 mg/dL (ref 0.50–1.10)

## 2012-11-15 MED ORDER — FAMOTIDINE 20 MG PO TABS: 20.0000 mg | ORAL_TABLET | Freq: Two times a day (BID) | ORAL | Status: DC

## 2012-11-15 MED ORDER — CETIRIZINE HCL 10 MG PO TABS: 10.0000 mg | ORAL_TABLET | Freq: Every day | ORAL | Status: DC

## 2012-11-15 NOTE — ED Notes (Signed)
Pt requesting to speak with Dr. Hyacinth Meeker.  MD notified.

## 2012-11-15 NOTE — ED Notes (Signed)
  Pt states started having mid upper chest pain that started Sunday nite while sitting.  No shortness of breath, pain with deep breathing, or long travel.  No cough.  Pain has consistent chest tightness with intermittent sharp pains

## 2012-11-15 NOTE — ED Provider Notes (Signed)
History     CSN: [id number]  Arrival date & time 11/15/12  4098   First MD Initiated Contact with Patient 11/15/12 [phone]      Chief Complaint  Patient presents with  . Chest Pain    (Consider location/radiation/quality/duration/timing/severity/associated sxs/prior treatment) HPI Comments: Central upper chest pain X 3 days - persistent, worse with deep breathing, nothing makes better.  No pain meds prior to arrival.  Worse at night - no hx of similar sx.  occaionsl sharp CP but not like this.  No SOB, no  N/v, no abd pain, no dizzy.    ACS: - no rf  PE:  - No risk factors  The history is provided by the patient.    Past Medical History  Diagnosis Date  . Allergy   . Eczema     Past Surgical History  Procedure Laterality Date  . Cesarean section      Family History  Problem Relation Age of Onset  . Hypertension Paternal Aunt   . Hypertension Paternal Grandmother     History  Substance Use Topics  . Smoking status: Never Smoker   . Smokeless tobacco: Never Used  . Alcohol Use: No    OB History   Grav Para Term Preterm Abortions TAB SAB Ect Mult Living                  Review of Systems  All other systems reviewed and are negative.    Allergies  Review of patient's allergies indicates no known allergies.  Home Medications   Current Outpatient Rx  Name  Route  Sig  Dispense  Refill  . cetirizine (ZYRTEC ALLERGY) 10 MG tablet   Oral   Take 1 tablet (10 mg total) by mouth daily.   30 tablet   1   . famotidine (PEPCID) 20 MG tablet   Oral   Take 1 tablet (20 mg total) by mouth 2 (two) times daily.   30 tablet   0     BP 109/71  Pulse 82  Temp(Src) 98 F (36.7 C) (Oral)  Resp 18  Wt 249 lb 3 oz (113.031 kg)  BMI 42.13 kg/m2  SpO2 98%  LMP 10/30/2012  Physical Exam  Nursing note and vitals reviewed. Constitutional: She appears well-developed and well-nourished. No distress.  HENT:  Head: Normocephalic and atraumatic.  Mouth/Throat:  Oropharynx is clear and moist. No oropharyngeal exudate.  Eyes: Conjunctivae and EOM are normal. Pupils are equal, round, and reactive to light. Right eye exhibits no discharge. Left eye exhibits no discharge. No scleral icterus.  Neck: Normal range of motion. Neck supple. No JVD present. No thyromegaly present.  Cardiovascular: Normal rate, regular rhythm, normal heart sounds and intact distal pulses.  Exam reveals no gallop and no friction rub.   No murmur heard. Pulmonary/Chest: Effort normal and breath sounds normal. No respiratory distress. She has no wheezes. She has no rales.  Abdominal: Soft. Bowel sounds are normal. She exhibits no distension and no mass. There is no tenderness.  Musculoskeletal: Normal range of motion. She exhibits no edema and no tenderness.  Lymphadenopathy:    She has no cervical adenopathy.  Neurological: She is alert. Coordination normal.  Skin: Skin is warm and dry. No rash noted. No erythema.  Psychiatric: She has a normal mood and affect. Her behavior is normal.    ED Course  Procedures (including critical care time)  Labs Reviewed  CBC  BASIC METABOLIC PANEL   Dg Chest 2 View  11/15/2012   *RADIOLOGY REPORT*  Clinical Data: Chest pain  CHEST - 2 VIEW  Comparison: None  Findings: Normal cardiac silhouette.  No effusion, infiltrate, or pneumothorax.  No acute osseous abnormality.  IMPRESSION: No acute cardiopulmonary process.   Original Report Authenticated By: Genevive Bi, M.D.     1. Chest pain   2. Seasonal allergies       MDM  Well appearing, no acute distress, normal EKG and normal vital signs. She has had ongoing symptoms for 4 days, this seems to be more positional and it, this is not in consistency with acute coronary syndrome or pulmonary embolism, chest x-ray is totally normal, the patient has been having increased allergy symptoms, we'll treat with Zyrtec, antacid, stable for discharge without any further testing at this  time.      ED ECG REPORT  I personally interpreted this EKG   Date: 11/15/2012   Rate: 80  Rhythm: normal sinus rhythm  QRS Axis: normal  Intervals: normal  ST/T Wave abnormalities: normal  Conduction Disutrbances:none  Narrative Interpretation:   Old EKG Reviewed: none available   Vida Roller, MD 11/15/12 [phone]

## 2012-11-15 NOTE — ED Notes (Signed)
Pressure to mid chest x 2 days. Reports worse with deep breath. Some shortness of breath, denies pain after eating. Denies known injury or recent lifting.

## 2013-04-20 ENCOUNTER — Ambulatory Visit: Payer: Medicaid Other | Admitting: Advanced Practice Midwife

## 2013-05-20 ENCOUNTER — Emergency Department (HOSPITAL_COMMUNITY): Payer: BC Managed Care – PPO

## 2013-05-20 ENCOUNTER — Encounter (HOSPITAL_COMMUNITY): Payer: Self-pay | Admitting: Emergency Medicine

## 2013-05-20 ENCOUNTER — Emergency Department (HOSPITAL_COMMUNITY)
Admission: EM | Admit: 2013-05-20 | Discharge: 2013-05-21 | Disposition: A | Discharge status: Home / Self Care | Payer: BC Managed Care – PPO | Attending: Emergency Medicine | Admitting: Emergency Medicine

## 2013-05-20 DIAGNOSIS — J209 Acute bronchitis, unspecified: Secondary | ICD-10-CM | POA: Insufficient documentation

## 2013-05-20 DIAGNOSIS — J40 Bronchitis, not specified as acute or chronic: Secondary | ICD-10-CM

## 2013-05-20 DIAGNOSIS — Z9109 Other allergy status, other than to drugs and biological substances: Secondary | ICD-10-CM | POA: Insufficient documentation

## 2013-05-20 DIAGNOSIS — J069 Acute upper respiratory infection, unspecified: Secondary | ICD-10-CM | POA: Insufficient documentation

## 2013-05-20 DIAGNOSIS — Z79899 Other long term (current) drug therapy: Secondary | ICD-10-CM | POA: Insufficient documentation

## 2013-05-20 DIAGNOSIS — B349 Viral infection, unspecified: Secondary | ICD-10-CM

## 2013-05-20 DIAGNOSIS — R197 Diarrhea, unspecified: Secondary | ICD-10-CM | POA: Insufficient documentation

## 2013-05-20 DIAGNOSIS — R51 Headache: Secondary | ICD-10-CM | POA: Insufficient documentation

## 2013-05-20 DIAGNOSIS — R509 Fever, unspecified: Secondary | ICD-10-CM | POA: Insufficient documentation

## 2013-05-20 DIAGNOSIS — H9209 Otalgia, unspecified ear: Secondary | ICD-10-CM | POA: Insufficient documentation

## 2013-05-20 DIAGNOSIS — B9789 Other viral agents as the cause of diseases classified elsewhere: Secondary | ICD-10-CM | POA: Insufficient documentation

## 2013-05-20 DIAGNOSIS — L259 Unspecified contact dermatitis, unspecified cause: Secondary | ICD-10-CM | POA: Insufficient documentation

## 2013-05-20 MED ORDER — ACETAMINOPHEN 325 MG PO TABS
650.0000 mg | ORAL_TABLET | Freq: Once | ORAL | Status: AC
  Administered 2013-05-20: 650 mg via ORAL
  Filled 2013-05-20: qty 2

## 2013-05-20 MED ORDER — ACETAMINOPHEN 325 MG PO TABS: 325.0000 mg | ORAL_TABLET | Freq: Once | ORAL | Status: DC

## 2013-05-20 MED ORDER — ALBUTEROL SULFATE HFA 108 (90 BASE) MCG/ACT IN AERS
2.0000 | INHALATION_SPRAY | Freq: Once | RESPIRATORY_TRACT | Status: AC
  Administered 2013-05-21: 2 via RESPIRATORY_TRACT
  Filled 2013-05-20: qty 6.7

## 2013-05-20 NOTE — ED Provider Notes (Signed)
CSN: [id number]     Arrival date & time 05/20/13  2126 History  This chart was scribed for non-physician practitioner Raymon Mutton, PA-C working with Richardean Canal, MD by Caryn Bee, ED Scribe. This patient was seen in room TR06C/TR06C and the patient's care was started at 10:12 PM.     Chief Complaint  Patient presents with  . Sore Throat  . Headache  . Chills   HPI HPI Comments: Lauren Powell is a 27 y.o. female who presents to the Emergency Department complaining of gradual onset cold-like symptoms that began 4 days ago. Pt reports associated headache, ear pain, chills, sore throat, nasal congestion, cough productive with yellow phlegm. Pt reported that when she blows her nose it is mainly a clear color. She also reports spitting up bright red blood when scratching her throat. Denies chest pain, leg swelling, bloody stools, abdominal pain, nausea, vomiting. Pt reports that she has been drinking a lot of tea lately, denied taking OTC medication for symptom relief. Denies recent travel, birth control. Pt is unsure of sick contacts, but works as a Runner, broadcasting/film/video. Pt did not get flu vaccine this year.    Past Medical History  Diagnosis Date  . Allergy   . Eczema    Past Surgical History  Procedure Laterality Date  . Cesarean section     Family History  Problem Relation Age of Onset  . Hypertension Paternal Aunt   . Hypertension Paternal Grandmother    History  Substance Use Topics  . Smoking status: Never Smoker   . Smokeless tobacco: Never Used  . Alcohol Use: No   OB History   Grav Para Term Preterm Abortions TAB SAB Ect Mult Living   1 1 1       1      Review of Systems  Constitutional: Positive for fever and chills.  HENT: Positive for congestion, ear pain, rhinorrhea and sore throat.   Respiratory: Positive for cough.   Cardiovascular: Negative for chest pain and leg swelling.  Gastrointestinal: Positive for diarrhea. Negative for nausea, vomiting, abdominal pain and  constipation.  Neurological: Positive for headaches.  All other systems reviewed and are negative.    Allergies  Review of patient's allergies indicates no known allergies.  Home Medications   Current Outpatient Rx  Name  Route  Sig  Dispense  Refill  . cetirizine (ZYRTEC ALLERGY) 10 MG tablet   Oral   Take 1 tablet (10 mg total) by mouth daily.   30 tablet   1   . PATADAY 0.2 % SOLN   Both Eyes   Place 1 drop into both eyes daily.          BP 119/80  Pulse 90  Temp(Src) 99.4 F (37.4 C) (Oral)  Resp 18  Wt 251 lb 3.2 oz (113.944 kg)  SpO2 99%  LMP 05/14/2013 Physical Exam  Nursing note and vitals reviewed. Constitutional: She is oriented to person, place, and time. She appears well-developed and well-nourished. No distress.  Nondiaphoretic  HENT:  Head: Normocephalic and atraumatic.  Right Ear: Tympanic membrane, external ear and ear canal normal.  Left Ear: Tympanic membrane, external ear and ear canal normal.  Mouth/Throat: Uvula is midline and oropharynx is clear and moist. No oropharyngeal exudate.  Mild erythema localized to the posterior oropharynx at bilateral tonsils. Negative swelling, exudate, petechia, lesions, sores to the posterior oropharynx and bilateral tonsils. Uvula midline, symmetrical elevation.  Eyes: EOM are normal.  Neck: Normal range of motion. Neck  supple.  Negative neck stiffness Negative nuchal rigidity Negative cervical lymphadenopathy  Cardiovascular: Normal rate, regular rhythm and normal heart sounds.   No murmur heard. Pulses:      Radial pulses are 2+ on the right side, and 2+ on the left side.  Pulmonary/Chest: Effort normal. No respiratory distress. She has no rales.  Airway intact Negative respiratory distress Patient able to speak in full sentences without difficulty Negative use of accessory muscles  Decreased breath sounds to lower lobes bilaterally  Musculoskeletal: Normal range of motion.  Lymphadenopathy:    She  has no cervical adenopathy.  Neurological: She is alert and oriented to person, place, and time. She exhibits normal muscle tone. Coordination normal.  Skin: Skin is warm and dry. She is not diaphoretic.  Psychiatric: She has a normal mood and affect. Her behavior is normal.    ED Course  Procedures (including critical care time) DIAGNOSTIC STUDIES: Oxygen Saturation is 99% on room air, normal by my interpretation.    COORDINATION OF CARE: 10:17 PM-Discussed treatment plan with pt at bedside and pt agreed to plan.   Results for orders placed during the hospital encounter of 05/20/13  RAPID STREP SCREEN      Result Value Range   Streptococcus, Group A Screen (Direct) NEGATIVE  NEGATIVE  CULTURE, GROUP A STREP      Result Value Range   Specimen Description THROAT     Special Requests ADDED 2234     Culture       Value: NO SUSPICIOUS COLONIES, CONTINUING TO HOLD     Performed at [facility]   Report Status PENDING     Dg Chest 2 View  05/20/2013   CLINICAL DATA:  Fever, dry cough, chills, headache since Wednesday, sore throat  EXAM: CHEST  2 VIEW  COMPARISON:  11/15/2012  FINDINGS: Normal heart size, mediastinal contours, and pulmonary vascularity.  Mild chronic bronchitic changes.  Lungs otherwise clear.  No pleural effusion or pneumothorax.  No acute bony abnormalities.  IMPRESSION: Mild chronic bronchitic changes without acute infiltrate.   Electronically Signed   By: Ulyses Southward M.D.   On: 05/20/2013 23:27   Labs Review Labs Reviewed  RAPID STREP SCREEN  CULTURE, GROUP A STREP   Imaging Review Dg Chest 2 View  05/20/2013   CLINICAL DATA:  Fever, dry cough, chills, headache since Wednesday, sore throat  EXAM: CHEST  2 VIEW  COMPARISON:  11/15/2012  FINDINGS: Normal heart size, mediastinal contours, and pulmonary vascularity.  Mild chronic bronchitic changes.  Lungs otherwise clear.  No pleural effusion or pneumothorax.  No acute bony abnormalities.  IMPRESSION: Mild  chronic bronchitic changes without acute infiltrate.   Electronically Signed   By: Ulyses Southward M.D.   On: 05/20/2013 23:27    EKG Interpretation   None       MDM   1. URI (upper respiratory infection)   2. Viral illness   3. Bronchitis     Medications  albuterol (PROVENTIL HFA;VENTOLIN HFA) 108 (90 BASE) MCG/ACT inhaler 2 puff (not administered)  acetaminophen (TYLENOL) tablet 650 mg (650 mg Oral Given 05/20/13 2220)   Filed Vitals:   05/20/13 2133  BP: 119/80  Pulse: 90  Temp: 99.4 F (37.4 C)  TempSrc: Oral  Resp: 18  Weight: 251 lb 3.2 oz (113.944 kg)  SpO2: 99%    I personally performed the services described in this documentation, which was scribed in my presence. The recorded information has been reviewed and is accurate.  Patient presenting to emergency department with nasal congestion, productive cough, sore throat that has been ongoing since Wednesday-4 days. Patient reports she's been drinking hot tea-denied any over-the-counter medication or home remedy. Alert oriented. GCS 15. Heart rate and rhythm normal. Radial pulses 2+ bilaterally. Lung sounds diminished to lower lobes bilaterally. Mild erythema identified to the posterior oropharynx and tonsils bilaterally with negative findings of exudate, petechia, swelling. Uvula midline, symmetrical elevation. Negative oral lesions identified. Negative nuchal rigidity, negative neck stiffness-negative cervical lymphadenopathy. Negative meningeal signs. Negative swelling or erythema, negative bulging noted to the tympanic membranes bilaterally. Full range of motion to upper and lower extremities bilaterally without difficulty noted. Rapid strep test negative. Chest x-ray noted mild bronchitic changes without acute infiltrate. Doubt pneumonia. Suspicion to be viral infection, upper respiratory infection. Patient stable, afebrile. Discharged patient. Discussed with patient to rest and stay hydrated. Discussed with patient  supportive therapy. Discharged patient with albuterol inhaler. Referred patient to primary care provider. Discussed with patient to closely monitor symptoms if symptoms are to worsen or change to report back to emergency department-strict return instructions given. Patient agreed to plan of care, understood, all questions answered.  Raymon Mutton, PA-C 05/22/13 1406

## 2013-05-20 NOTE — ED Notes (Signed)
Pt states HA with Sore throat, cough and congestion for 3 days.  Pt states chills at home.

## 2013-05-20 NOTE — ED Notes (Signed)
Pt taken to xray 

## 2013-05-22 NOTE — ED Provider Notes (Signed)
Medical screening examination/treatment/procedure(s) were performed by non-physician practitioner and as supervising physician I was immediately available for consultation/collaboration.  EKG Interpretation   None         Richardean Canal, MD 05/22/13 1435

## 2013-05-23 LAB — CULTURE, GROUP A STREP

## 2013-05-29 ENCOUNTER — Encounter: Payer: Self-pay | Admitting: Advanced Practice Midwife

## 2013-05-29 ENCOUNTER — Ambulatory Visit (INDEPENDENT_AMBULATORY_CARE_PROVIDER_SITE_OTHER): Payer: BC Managed Care – PPO | Admitting: Advanced Practice Midwife

## 2013-05-29 VITALS — BP 119/79 | HR 83 | Temp 98.1°F | Ht 64.0 in | Wt 250.0 lb

## 2013-05-29 DIAGNOSIS — Z01419 Encounter for gynecological examination (general) (routine) without abnormal findings: Secondary | ICD-10-CM

## 2013-05-29 DIAGNOSIS — Z3169 Encounter for other general counseling and advice on procreation: Secondary | ICD-10-CM

## 2013-05-29 DIAGNOSIS — Z6841 Body Mass Index (BMI) 40.0 and over, adult: Secondary | ICD-10-CM

## 2013-05-29 DIAGNOSIS — Z3202 Encounter for pregnancy test, result negative: Secondary | ICD-10-CM

## 2013-05-29 DIAGNOSIS — Z0000 Encounter for general adult medical examination without abnormal findings: Secondary | ICD-10-CM

## 2013-05-29 LAB — POCT URINALYSIS DIPSTICK
Leukocytes, UA: NEGATIVE
Urobilinogen, UA: NEGATIVE
pH, UA: 5

## 2013-05-29 LAB — POCT URINE PREGNANCY: Preg Test, Ur: NEGATIVE

## 2013-05-29 NOTE — Progress Notes (Signed)
New patient in office today for annual gyn visit, denies any concerns at this time Subjective:     Lauren Powell is a 27 y.o. female here for a routine exam.  Current complaints: denies.  Personal health questionnaire reviewed: yes.  Patient had a normal pap 07/2012. Here today to establish care and conception counseling visit.  Patient is in a monogamous relationship. Denies risk of STI. Denies any GYN concerns.   Gynecologic History Patient's last menstrual period was 05/14/2013. Contraception: none Last Pap: 06/2012. Results were: normal Last mammogram: N/A Obstetric History OB History  Gravida Para Term Preterm AB SAB TAB Ectopic Multiple Living  1 1 1       1     # Outcome Date GA Lbr Len/2nd Weight Sex Delivery Anes PTL Lv  1 TRM                The following portions of the patient's history were reviewed and updated as appropriate: allergies, current medications, past family history, past medical history, past social history, past surgical history and problem list.  Review of Systems A comprehensive review of systems was negative.    Objective:    BP 119/79  Pulse 83  Temp(Src) 98.1 F (36.7 C)  Ht 5\' 4"  (1.626 m)  Wt 250 lb (113.399 kg)  BMI 42.89 kg/m2  LMP 05/14/2013 General appearance: alert and cooperative Head: Normocephalic, without obvious abnormality, atraumatic Eyes: conjunctivae/corneas clear. PERRL, EOM's intact. Fundi benign. Ears: normal TM's and external ear canals both ears Nose: Nares normal. Septum midline. Mucosa normal. No drainage or sinus tenderness. Throat: lips, mucosa, and tongue normal; teeth and gums normal Neck: no adenopathy, no carotid bruit, no JVD, supple, symmetrical, trachea midline and thyroid not enlarged, symmetric, no tenderness/mass/nodules Back: symmetric, no curvature. ROM normal. No CVA tenderness. Lungs: clear to auscultation bilaterally and normal percussion bilaterally Breasts: normal appearance, no masses or  tenderness Heart: regular rate and rhythm, S1, S2 normal, no murmur, click, rub or gallop Abdomen: soft, non-tender; bowel sounds normal; no masses,  no organomegaly Extremities: extremities normal, atraumatic, no cyanosis or edema Skin: Skin color, texture, turgor normal. No rashes or lesions Lymph nodes: Cervical, supraclavicular, and axillary nodes normal. Neurologic: Alert and oriented X 3, normal strength and tone. Normal symmetric reflexes. Normal coordination and gait    Assessment:    Healthy female exam.   Elevated BMI Contraception Counseling Not preventing pregnancy H/O c-section, candidate for VBAC  Plan:    Education reviewed: calcium supplements, low fat, low cholesterol diet, safe sex/STD prevention, self breast exams, skin cancer screening and weight bearing exercise. Follow up in: [facility]   Reviewed benefits of diet and exercise TSH and HgbA1C today Patient to take PNV Plan pap 07/2015  40 min spent with patient greater than 80% spent in counseling and coordination of care.

## 2013-06-05 ENCOUNTER — Telehealth: Payer: Self-pay | Admitting: *Deleted

## 2013-06-06 ENCOUNTER — Encounter: Payer: Self-pay | Admitting: Advanced Practice Midwife

## 2013-06-26 ENCOUNTER — Encounter: Payer: Self-pay | Admitting: Family

## 2013-06-26 ENCOUNTER — Ambulatory Visit (INDEPENDENT_AMBULATORY_CARE_PROVIDER_SITE_OTHER): Payer: BC Managed Care – PPO | Admitting: Family

## 2013-06-26 VITALS — BP 110/88 | HR 81 | Ht 64.0 in | Wt 253.0 lb

## 2013-06-26 DIAGNOSIS — R739 Hyperglycemia, unspecified: Secondary | ICD-10-CM

## 2013-06-26 DIAGNOSIS — R7309 Other abnormal glucose: Secondary | ICD-10-CM

## 2013-06-26 NOTE — Patient Instructions (Signed)

## 2013-06-26 NOTE — Progress Notes (Signed)
Pre visit review using our clinic review tool, if applicable. No additional management support is needed unless otherwise documented below in the visit note. 

## 2013-06-26 NOTE — Progress Notes (Signed)
   Subjective:    Patient ID: Lauren Powell, female    DOB: Jan 08, 1986, 28 y.o.   MRN: [id number]  HPI  28 year old [place], new patient to be established and to discuss elevated blood glucose identified at GYN. She has a family history of type 2 diabetes. She does not routinely exercise following any particular diet.   Review of Systems  Constitutional: Negative.   Respiratory: Negative.   Cardiovascular: Negative.   Endocrine: Negative for cold intolerance, heat intolerance, polydipsia, polyphagia and polyuria.  Genitourinary: Negative.   Musculoskeletal: Negative.   Skin: Negative.   Allergic/Immunologic: Negative.   Psychiatric/Behavioral: Negative.    Past Medical History  Diagnosis Date  . Allergy   . Eczema     History   Social History  . Marital Status: Single    Spouse Name: N/A    Number of Children: N/A  . Years of Education: N/A   Occupational History  . Not on file.   Social History Main Topics  . Smoking status: Never Smoker   . Smokeless tobacco: Never Used  . Alcohol Use: No  . Drug Use: No  . Sexual Activity: Yes    Birth Control/ Protection: None   Other Topics Concern  . Not on file   Social History Narrative   Patient lives with son, getting married in Feb 2014, works as Lawyersubstitute teacher.     Past Surgical History  Procedure Laterality Date  . Cesarean section    . Tooth extraction N/A     Family History  Problem Relation Age of Onset  . Hypertension Paternal Aunt   . Hypertension Paternal Grandmother   . Alcohol abuse Mother   . Drug abuse Mother   . Bipolar disorder Mother   . Cancer Paternal Grandfather   . Prostate cancer Paternal Grandfather     Allergies  Allergen Reactions  . Other     seasonal    Current Outpatient Prescriptions on File Prior to Visit  Medication Sig Dispense Refill  . cetirizine (ZYRTEC ALLERGY) 10 MG tablet Take 1 tablet (10 mg total) by mouth daily.  30 tablet  1  . PATADAY  0.2 % SOLN Place 1 drop into both eyes daily.       No current facility-administered medications on file prior to visit.    BP 110/88  Pulse 81  Ht 5\' 4"  (1.626 m)  Wt 253 lb (114.76 kg)  BMI 43.41 kg/m2chart    Objective:   Physical Exam  Constitutional: She is oriented to person, place, and time. She appears well-developed and well-nourished.  Neck: Normal range of motion. Neck supple.  Cardiovascular: Normal rate, regular rhythm and normal heart sounds.   Pulmonary/Chest: Effort normal and breath sounds normal.  Musculoskeletal: Normal range of motion.  Neurological: She is alert and oriented to person, place, and time.  Skin: Skin is warm.  Psychiatric: She has a normal mood and affect.          Assessment & Plan:  Assessment: 1. Hyperglycemia recheck A1c in 3 months. Reduce weight by 10%, 25 pounds over the next 4-6 months. Watch intake of sugars and starches. Call the office with any questions or concerns. Check his schedule and as needed.

## 2013-08-27 ENCOUNTER — Ambulatory Visit: Payer: BC Managed Care – PPO | Admitting: Family

## 2013-08-27 DIAGNOSIS — Z0289 Encounter for other administrative examinations: Secondary | ICD-10-CM

## 2013-09-24 ENCOUNTER — Other Ambulatory Visit: Payer: BC Managed Care – PPO

## 2013-09-26 ENCOUNTER — Other Ambulatory Visit: Payer: BC Managed Care – PPO

## 2013-10-03 ENCOUNTER — Other Ambulatory Visit: Payer: BC Managed Care – PPO

## 2014-03-28 IMAGING — CR DG CHEST 2V
2 series · 2 of 2 positions shown · non-contrast
Comparison: 11/15/2012

CLINICAL DATA: Fever, dry cough, chills, headache since [REDACTED],
sore throat

EXAM:
CHEST  2 VIEW

[w chest pa]
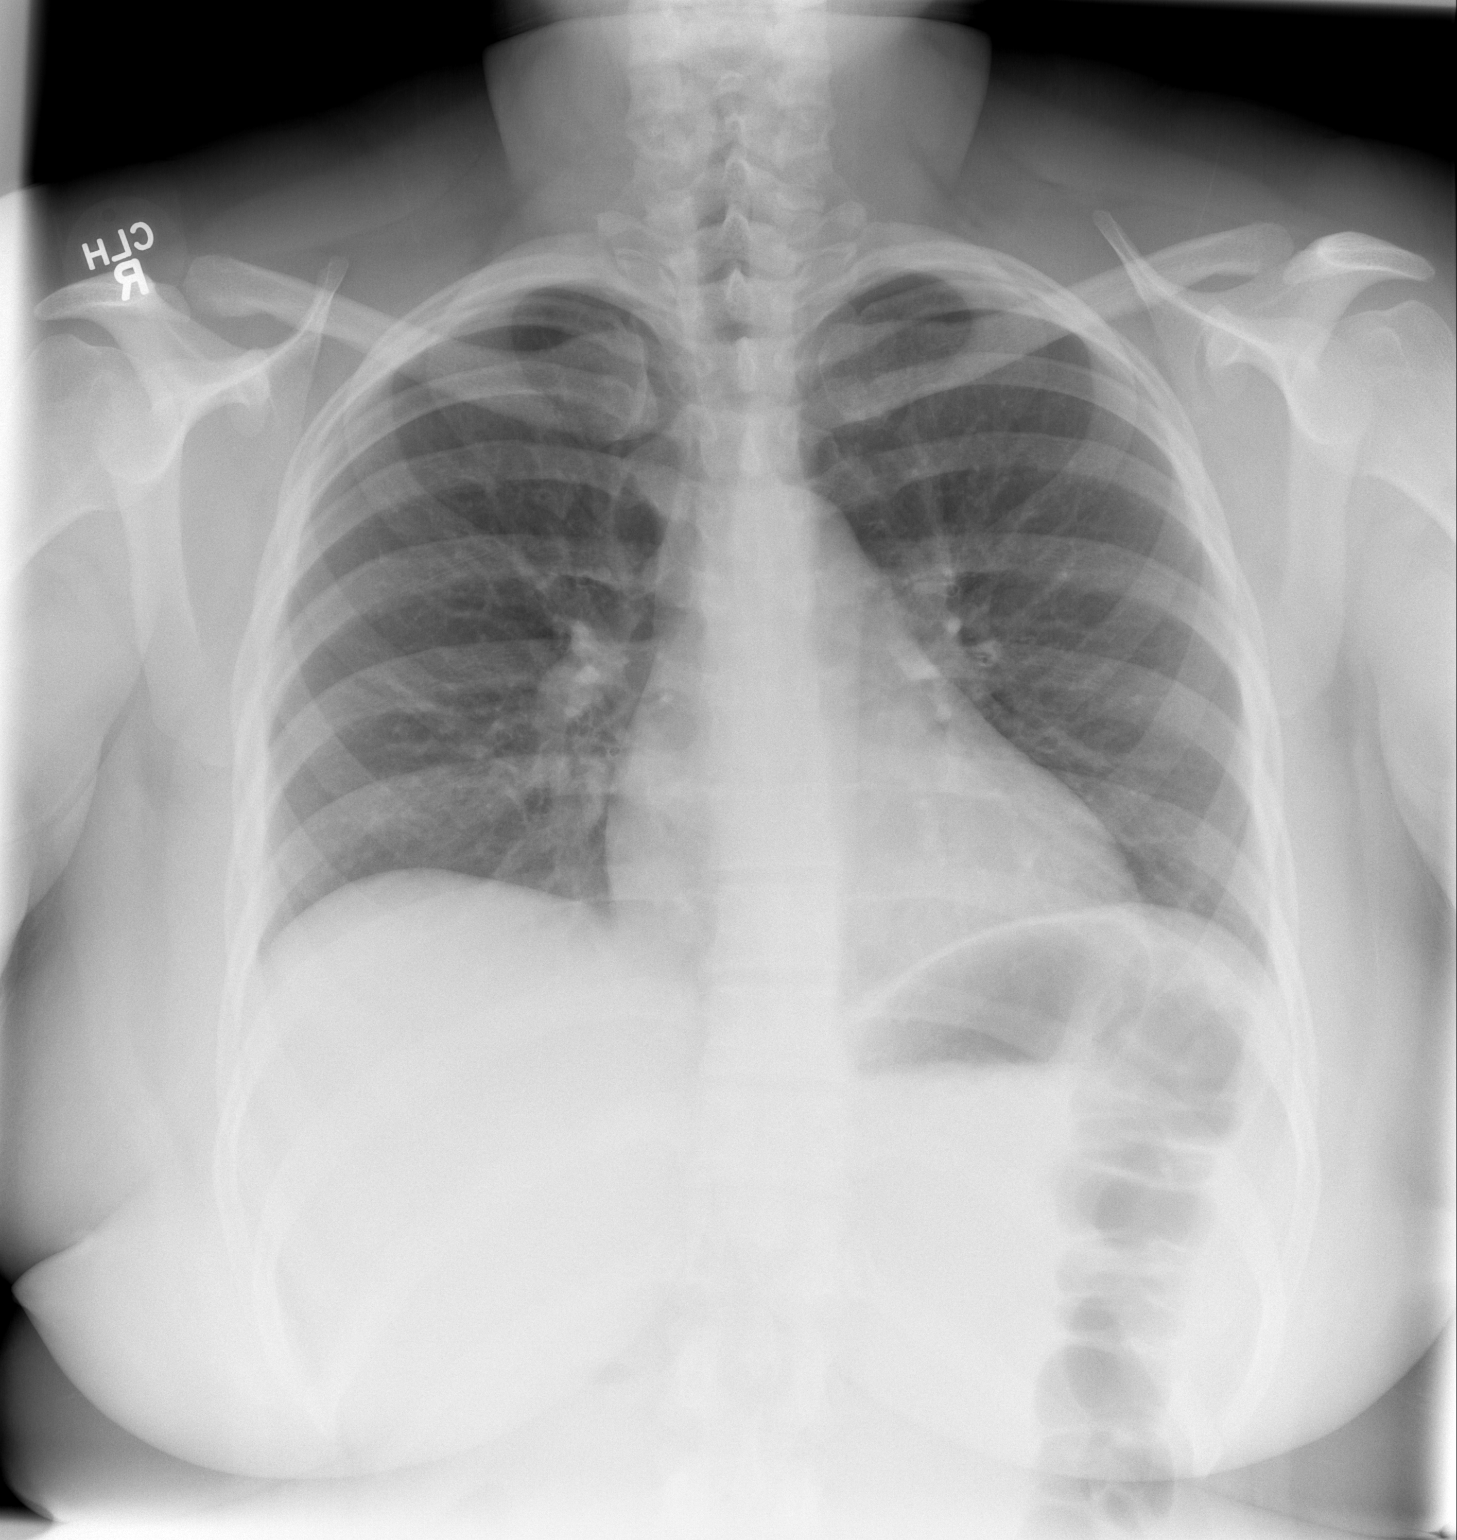

[w chest lat]
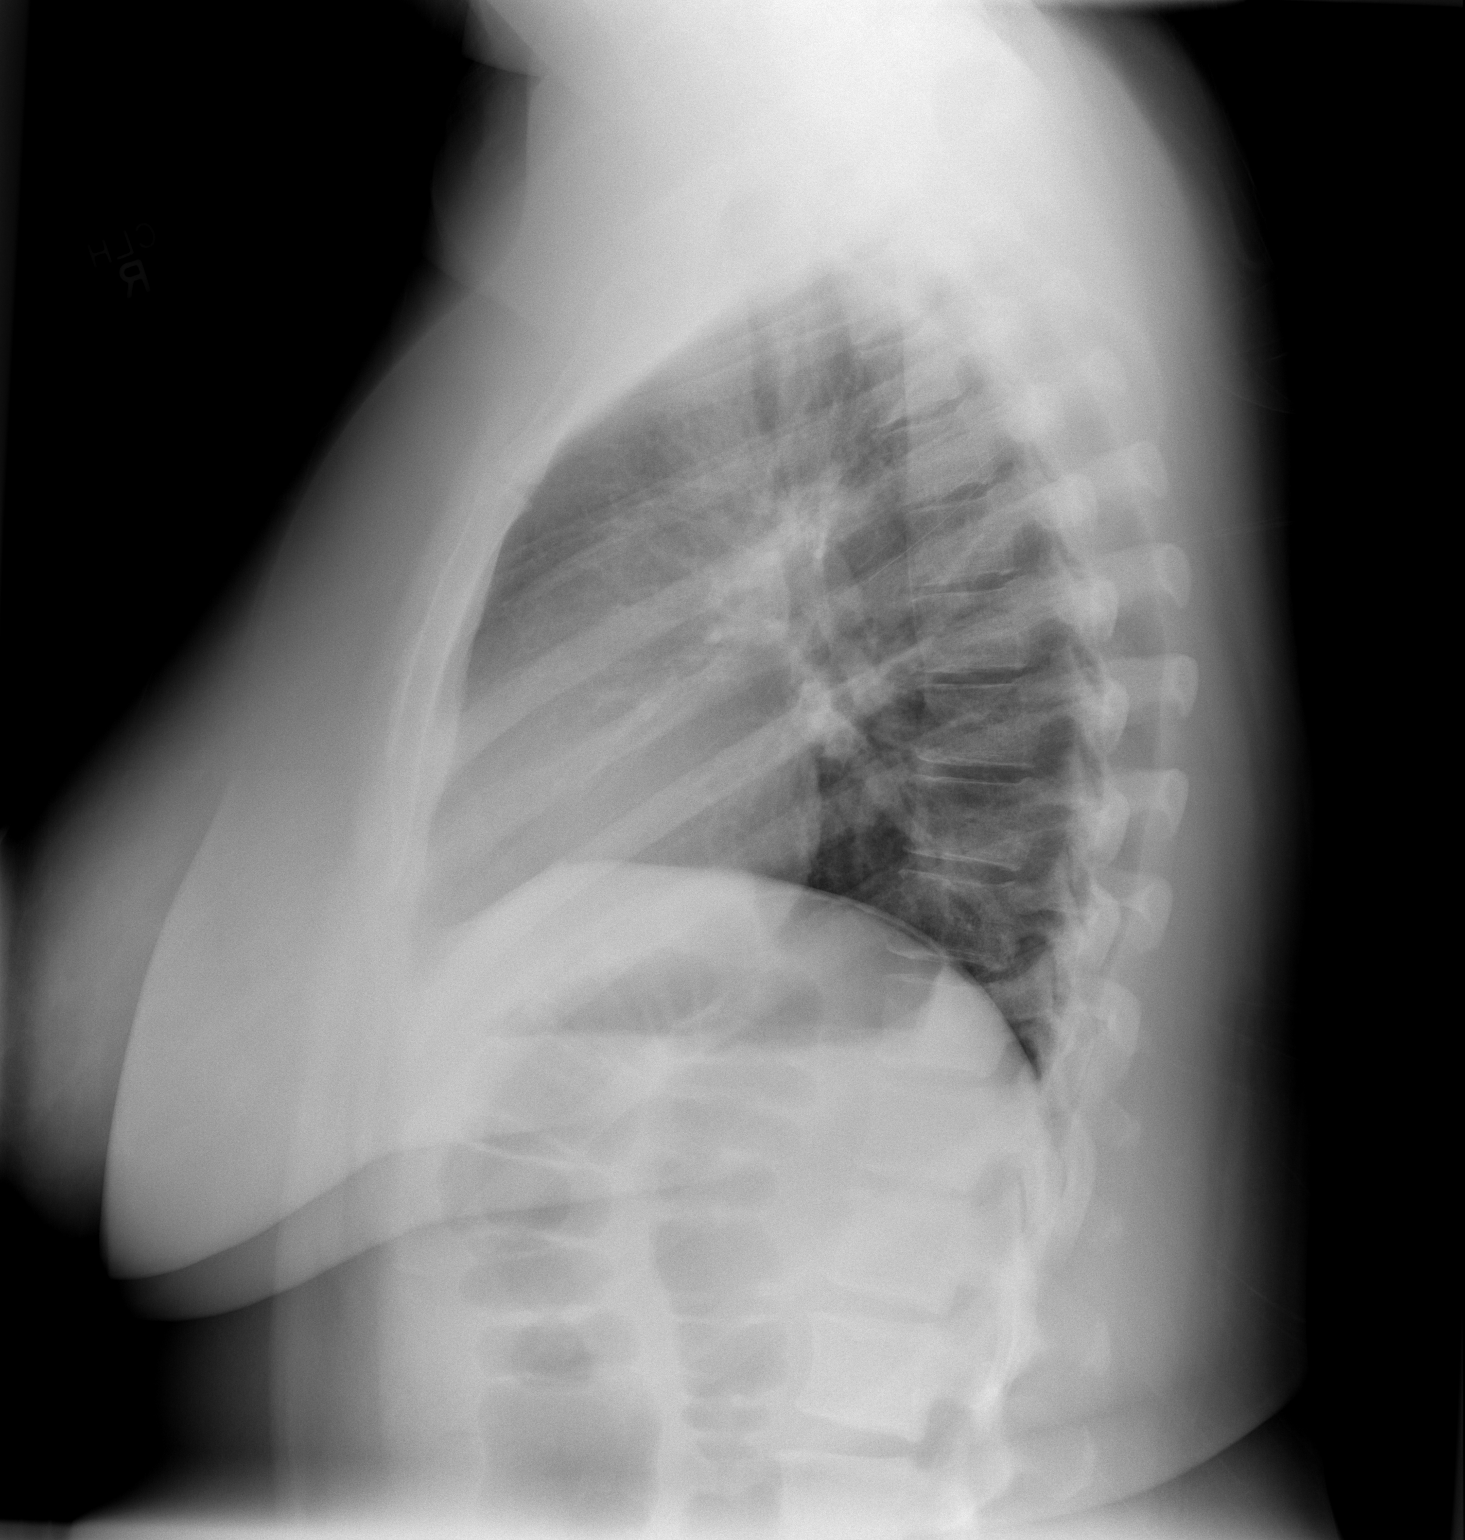

[2 of 2 positions shown; findings below may reference images not displayed]

FINDINGS: Normal heart size, mediastinal contours, and pulmonary vascularity.

Mild chronic bronchitic changes.

Lungs otherwise clear.

No pleural effusion or pneumothorax.

No acute bony abnormalities.
IMPRESSION: Mild chronic bronchitic changes without acute infiltrate.

## 2014-04-08 ENCOUNTER — Encounter: Payer: Self-pay | Admitting: Family

## 2014-09-12 ENCOUNTER — Emergency Department (HOSPITAL_COMMUNITY)
Admission: EM | Admit: 2014-09-12 | Discharge: 2014-09-12 | Disposition: A | Discharge status: Home / Self Care | Payer: BC Managed Care – PPO | Attending: Emergency Medicine | Admitting: Emergency Medicine

## 2014-09-12 ENCOUNTER — Emergency Department (HOSPITAL_COMMUNITY): Payer: BC Managed Care – PPO

## 2014-09-12 ENCOUNTER — Encounter (HOSPITAL_COMMUNITY): Payer: Self-pay | Admitting: Emergency Medicine

## 2014-09-12 DIAGNOSIS — Z872 Personal history of diseases of the skin and subcutaneous tissue: Secondary | ICD-10-CM | POA: Diagnosis not present

## 2014-09-12 DIAGNOSIS — R0789 Other chest pain: Secondary | ICD-10-CM | POA: Insufficient documentation

## 2014-09-12 DIAGNOSIS — R079 Chest pain, unspecified: Secondary | ICD-10-CM | POA: Diagnosis present

## 2014-09-12 DIAGNOSIS — R0602 Shortness of breath: Secondary | ICD-10-CM | POA: Diagnosis not present

## 2014-09-12 DIAGNOSIS — Z79899 Other long term (current) drug therapy: Secondary | ICD-10-CM | POA: Diagnosis not present

## 2014-09-12 LAB — I-STAT CHEM 8, ED
BUN: 10 mg/dL (ref 6–23)
CALCIUM ION: 1.22 mmol/L (ref 1.12–1.23)
CHLORIDE: 102 mmol/L (ref 96–112)
CREATININE: 0.9 mg/dL (ref 0.50–1.10)
GLUCOSE: 103 mg/dL — AB (ref 70–99)
HEMATOCRIT: 40 % (ref 36.0–46.0)
Hemoglobin: 13.6 g/dL (ref 12.0–15.0)
POTASSIUM: 3.4 mmol/L — AB (ref 3.5–5.1)
Sodium: 143 mmol/L (ref 135–145)
TCO2: 24 mmol/L (ref 0–100)

## 2014-09-12 LAB — CBC WITH DIFFERENTIAL/PLATELET
BASOS PCT: 0 % (ref 0–1)
Basophils Absolute: 0.1 10*3/uL (ref 0.0–0.1)
Eosinophils Absolute: 0.2 10*3/uL (ref 0.0–0.7)
Eosinophils Relative: 1 % (ref 0–5)
HEMATOCRIT: 37.9 % (ref 36.0–46.0)
HEMOGLOBIN: 12.4 g/dL (ref 12.0–15.0)
LYMPHS ABS: 3.4 10*3/uL (ref 0.7–4.0)
Lymphocytes Relative: 25 % (ref 12–46)
MCH: 26.4 pg (ref 26.0–34.0)
MCHC: 32.7 g/dL (ref 30.0–36.0)
MCV: 80.8 fL (ref 78.0–100.0)
MONO ABS: 0.6 10*3/uL (ref 0.1–1.0)
MONOS PCT: 4 % (ref 3–12)
NEUTROS ABS: 9.4 10*3/uL — AB (ref 1.7–7.7)
NEUTROS PCT: 70 % (ref 43–77)
Platelets: 295 10*3/uL (ref 150–400)
RBC: 4.69 MIL/uL (ref 3.87–5.11)
RDW: 14.3 % (ref 11.5–15.5)
WBC: 13.6 10*3/uL — ABNORMAL HIGH (ref 4.0–10.5)

## 2014-09-12 LAB — I-STAT TROPONIN, ED: Troponin i, poc: 0.05 ng/mL (ref 0.00–0.08)

## 2014-09-12 MED ORDER — KETOROLAC TROMETHAMINE 60 MG/2ML IM SOLN
30.0000 mg | Freq: Once | INTRAMUSCULAR | Status: AC
  Administered 2014-09-12: 30 mg via INTRAMUSCULAR
  Filled 2014-09-12: qty 2

## 2014-09-12 NOTE — ED Provider Notes (Signed)
CSN: [id number]     Arrival date & time 09/12/14  2038 History  This chart was scribed for non-physician practitioner Wynetta EmeryNicole Paisli Silfies, PA working with Benjiman CoreNathan Pickering, MD, by Tanda RockersMargaux Venter, ED Scribe. This patient was seen in room TR09C/TR09C and the patient's care was started at 10:42 PM.      Chief Complaint  Patient presents with  . Chest Pain   The history is provided by the patient. No language interpreter was used.     HPI Comments: Lauren Powell is a 29 y.o. female who presents to the Emergency Department complaining of chest pain that began earlier today. 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 She rates the pain as a 6/10 on the pain scale and she describes it as a pressure. Pt states she was having tachycardia and shortness of breath as well. Pt went home after the field trip and states her symptoms would not resolve on its own, causing her to come to the ED. Pt also complains of a cough and post nasal drip. The pain is exacerbated with applying pressure to the chest. She denies pain being exacerbated with deep breathing. She also denies pain radiation, diaphoresis, nausea, vomiting, or any other symptoms. Denies family hx of early cardiac death, being on birth control, recent surgery, recent prolonged travel, or calf pain.    Past Medical History  Diagnosis Date  . Allergy   . Eczema    Past Surgical History  Procedure Laterality Date  . Cesarean section    . Tooth extraction N/A    Family History  Problem Relation Age of Onset  . Hypertension Paternal Aunt   . Hypertension Paternal Grandmother   . Alcohol abuse Mother   . Drug abuse Mother   . Bipolar disorder Mother   . Cancer Paternal Grandfather   . Prostate cancer Paternal Grandfather    History  Substance Use Topics  . Smoking status: Never Smoker   . Smokeless tobacco: Never Used  . Alcohol Use: No   OB History    Gravida Para Term Preterm AB TAB SAB Ectopic Multiple Living   1 1 1       1      Review of Systems  A complete 10 system review of systems was obtained and all systems are negative except as noted in the HPI and PMH.    Allergies  Other  Home Medications   Prior to Admission medications   Medication Sig Start Date End Date Taking? Authorizing Provider  cetirizine (ZYRTEC ALLERGY) 10 MG tablet Take 1 tablet (10 mg total) by mouth daily. 11/15/12   Eber HongBrian Miller, MD  PATADAY 0.2 % SOLN Place 1 drop into both eyes daily. 04/12/13   Historical Provider, MD   Triage Vitals: BP 111/84 mmHg  Pulse 96  Temp(Src) 98.3 F (36.8 C) (Oral)  Resp 16  Ht 5\' 5"  (1.651 m)  Wt 265 lb (120.203 kg)  BMI 44.10 kg/m2  SpO2 100%  LMP 09/08/2014   Physical Exam  Constitutional: She is oriented to person, place, and time. She appears well-developed and well-nourished. No distress.  HENT:  Head: Normocephalic and atraumatic.  Mouth/Throat: Oropharynx is clear and moist.  Eyes: Conjunctivae and EOM are normal.  Neck: Neck supple. No tracheal deviation present.  Cardiovascular: Normal rate, regular rhythm and intact distal pulses.   Pulmonary/Chest: Effort normal. No respiratory distress. She has no wheezes.  She has no rales. She exhibits tenderness.  Abdominal: Soft. She exhibits no distension and no mass. There is no tenderness. There is no rebound and no guarding.  Musculoskeletal: Normal range of motion. She exhibits no edema or tenderness.       Arms: No calf asymmetry, superficial collaterals, palpable cords, edema, Homans sign negative bilaterally.    Neurological: She is alert and oriented to person, place, and time.  Skin: Skin is warm and dry.  Psychiatric: She has a normal mood and affect. Her behavior is normal.  Nursing note and vitals reviewed.   ED Course  Procedures (including critical care time)  DIAGNOSTIC STUDIES: Oxygen Saturation is 100% on RA, normal by my  interpretation.    COORDINATION OF CARE: 10:47 PM-Discussed treatment plan which includes pain medication with pt at bedside and pt agreed to plan.   Labs Review Labs Reviewed  CBC WITH DIFFERENTIAL/PLATELET - Abnormal; Notable for the following:    WBC 13.6 (*)    Neutro Abs 9.4 (*)    All other components within normal limits  I-STAT CHEM 8, ED - Abnormal; Notable for the following:    Potassium 3.4 (*)    Glucose, Bld 103 (*)    All other components within normal limits  I-STAT TROPOININ, ED    Imaging Review Dg Chest 2 View  09/12/2014   CLINICAL DATA:  Chest pain  EXAM: CHEST  2 VIEW  COMPARISON:  05/20/2013  FINDINGS: Normal heart size and mediastinal contours. No acute infiltrate or edema. No effusion or pneumothorax. No acute osseous findings.  IMPRESSION: Normal chest.   Electronically Signed   By: Marnee Spring M.D.   On: 09/12/2014 21:41     EKG Interpretation None      MDM   Final diagnoses:  Chest wall pain    Filed Vitals:   09/12/14 2049  BP: 111/84  Pulse: 96  Temp: 98.3 F (36.8 C)  TempSrc: Oral  Resp: 16  Height:  (1.651 m)  Weight: 265 lb (120.203 kg)  SpO2: 100%    Medications  ketorolac (TORADOL) injection 30 mg (30 mg Intramuscular Given 09/12/14 2250)    Lauren Powell is a pleasant 29 y.o. female presenting with sided chest pain which was associated with shortness of breath after patient was hiking. Shortness of breath has resolved, chest pain is exacerbated by movement and palpation. It is reproducible. Lung sounds are clear. Patient is low risk by Wells criteria and PERC  negative. Costochondritis. Patient will be given a shot of Toradol and advised to start high-dose Motrin tomorrow. Patient's workup is negative aside from a mild leukocytosis of 13. EKG with no abnormalities, chest x-ray is clear.   Evaluation does not show pathology that would require ongoing emergent intervention or inpatient treatment. Pt is  hemodynamically stable and mentating appropriately. Discussed findings and plan with patient/guardian, who agrees with care plan. All questions answered. Return precautions discussed and outpatient follow up given.    I personally performed the services described in this documentation, which was scribed in my presence. The recorded information has been reviewed and is accurate.     Wynetta Emery, PA-C 09/13/14 5621  Benjiman Core, MD 09/16/14 1459

## 2014-09-12 NOTE — ED Notes (Signed)
Pt st's she developed mid chest pain earlier today when walking.  Also felt short of breath.  Pt denies nausea or vomiting.

## 2014-09-12 NOTE — ED Notes (Signed)
Pt c/o centralized chest pain with sob after walking today. No radiation. Lung sounds clear

## 2014-09-12 NOTE — Discharge Instructions (Signed)
For pain control please take ibuprofen (also known as Motrin or Advil) 800mg (this is normally 4 over the counter pills) 3 times a day  for 5 days. Take with food to minimize stomach irritation. ° °Please follow with your primary care doctor in the next 2 days for a check-up. They must obtain records for further management.  ° °Do not hesitate to return to the Emergency Department for any new, worsening or concerning symptoms.  ° ° °Chest Wall Pain °Chest wall pain is pain in or around the bones and muscles of your chest. It may take up to 6 weeks to get better. It may take longer if you must stay physically active in your work and activities.  °CAUSES  °Chest wall pain may happen on its own. However, it may be caused by: °· A viral illness like the flu. °· Injury. °· Coughing. °· Exercise. °· Arthritis. °· Fibromyalgia. °· Shingles. °HOME CARE INSTRUCTIONS  °· Avoid overtiring physical activity. Try not to strain or perform activities that cause pain. This includes any activities using your chest or your abdominal and side muscles, especially if heavy weights are used. °· Put ice on the sore area. °¨ Put ice in a plastic bag. °¨ Place a towel between your skin and the bag. °¨ Leave the ice on for 15-20 minutes per hour while awake for the first 2 days. °· Only take over-the-counter or prescription medicines for pain, discomfort, or fever as directed by your caregiver. °SEEK IMMEDIATE MEDICAL CARE IF:  °· Your pain increases, or you are very uncomfortable. °· You have a fever. °· Your chest pain becomes worse. °· You have new, unexplained symptoms. °· You have nausea or vomiting. °· You feel sweaty or lightheaded. °· You have a cough with phlegm (sputum), or you cough up blood. °MAKE SURE YOU:  °· Understand these instructions. °· Will watch your condition. °· Will get help right away if you are not doing well or get worse. °Document Released: 05/24/2005 Document Revised: 08/16/2011 Document Reviewed:  01/18/2011 °ExitCare® Patient Information ©2015 [employer]. This information is not intended to replace advice given to you by your health care provider. Make sure you discuss any questions you have with your health care provider. ° °

## 2014-09-15 ENCOUNTER — Emergency Department (INDEPENDENT_AMBULATORY_CARE_PROVIDER_SITE_OTHER)
Admission: EM | Admit: 2014-09-15 | Discharge: 2014-09-15 | Disposition: A | Discharge status: Home / Self Care | Payer: BC Managed Care – PPO | Source: Home / Self Care | Attending: Family Medicine | Admitting: Family Medicine

## 2014-09-15 ENCOUNTER — Encounter (HOSPITAL_COMMUNITY): Payer: Self-pay | Admitting: Emergency Medicine

## 2014-09-15 DIAGNOSIS — J069 Acute upper respiratory infection, unspecified: Secondary | ICD-10-CM

## 2014-09-15 LAB — POCT RAPID STREP A: STREPTOCOCCUS, GROUP A SCREEN (DIRECT): NEGATIVE

## 2014-09-15 MED ORDER — IPRATROPIUM BROMIDE 0.06 % NA SOLN: 2.0000 | Freq: Four times a day (QID) | NASAL | Status: DC

## 2014-09-15 MED ORDER — LORATADINE 10 MG PO TABS: 10.0000 mg | ORAL_TABLET | Freq: Every day | ORAL | Status: DC

## 2014-09-15 NOTE — ED Notes (Signed)
Patient c/o cold like sx including headache, coughing, congestion, weakness, chest pain and back pain x 2 days. Patient is in NAD.

## 2014-09-15 NOTE — Discharge Instructions (Signed)
Your test for strep throat was negative. Plenty of fluids, rest, tylenol or ibuprofen as directed on packaging for pain/discomfort. Medications as prescribed for nasal congestion and seasonal allergies. Warm salt water gargles for throat. If throat culture indicates the need for additional treatment, you will be notified by phone.  Upper Respiratory Infection, Adult An upper respiratory infection (URI) is also sometimes known as the common cold. The upper respiratory tract includes the nose, sinuses, throat, trachea, and bronchi. Bronchi are the airways leading to the lungs. Most people improve within 1 week, but symptoms can last up to 2 weeks. A residual cough may last even longer.  CAUSES Many different viruses can infect the tissues lining the upper respiratory tract. The tissues become irritated and inflamed and often become very moist. Mucus production is also common. A cold is contagious. You can easily spread the virus to others by oral contact. This includes kissing, sharing a glass, coughing, or sneezing. Touching your mouth or nose and then touching a surface, which is then touched by another person, can also spread the virus. SYMPTOMS  Symptoms typically develop 1 to 3 days after you come in contact with a cold virus. Symptoms vary from person to person. They may include:  Runny nose.  Sneezing.  Nasal congestion.  Sinus irritation.  Sore throat.  Loss of voice (laryngitis).  Cough.  Fatigue.  Muscle aches.  Loss of appetite.  Headache.  Low-grade fever. DIAGNOSIS  You might diagnose your own cold based on familiar symptoms, since most people get a cold 2 to 3 times a year. Your caregiver can confirm this based on your exam. Most importantly, your caregiver can check that your symptoms are not due to another disease such as strep throat, sinusitis, pneumonia, asthma, or epiglottitis. Blood tests, throat tests, and X-rays are not necessary to diagnose a common cold, but  they may sometimes be helpful in excluding other more serious diseases. Your caregiver will decide if any further tests are required. RISKS AND COMPLICATIONS  You may be at risk for a more severe case of the common cold if you smoke cigarettes, have chronic heart disease (such as heart failure) or lung disease (such as asthma), or if you have a weakened immune system. The very young and very old are also at risk for more serious infections. Bacterial sinusitis, middle ear infections, and bacterial pneumonia can complicate the common cold. The common cold can worsen asthma and chronic obstructive pulmonary disease (COPD). Sometimes, these complications can require emergency medical care and may be life-threatening. PREVENTION  The best way to protect against getting a cold is to practice good hygiene. Avoid oral or hand contact with people with cold symptoms. Wash your hands often if contact occurs. There is no clear evidence that vitamin C, vitamin E, echinacea, or exercise reduces the chance of developing a cold. However, it is always recommended to get plenty of rest and practice good nutrition. TREATMENT  Treatment is directed at relieving symptoms. There is no cure. Antibiotics are not effective, because the infection is caused by a virus, not by bacteria. Treatment may include:  Increased fluid intake. Sports drinks offer valuable electrolytes, sugars, and fluids.  Breathing heated mist or steam (vaporizer or shower).  Eating chicken soup or other clear broths, and maintaining good nutrition.  Getting plenty of rest.  Using gargles or lozenges for comfort.  Controlling fevers with ibuprofen or acetaminophen as directed by your caregiver.  Increasing usage of your inhaler if you have asthma.  Zinc gel and zinc lozenges, taken in the first 24 hours of the common cold, can shorten the duration and lessen the severity of symptoms. Pain medicines may help with fever, muscle aches, and throat  pain. A variety of non-prescription medicines are available to treat congestion and runny nose. Your caregiver can make recommendations and may suggest nasal or lung inhalers for other symptoms.  HOME CARE INSTRUCTIONS   Only take over-the-counter or prescription medicines for pain, discomfort, or fever as directed by your caregiver.  Use a warm mist humidifier or inhale steam from a shower to increase air moisture. This may keep secretions moist and make it easier to breathe.  Drink enough water and fluids to keep your urine clear or pale yellow.  Rest as needed.  Return to work when your temperature has returned to normal or as your caregiver advises. You may need to stay home longer to avoid infecting others. You can also use a face mask and careful hand washing to prevent spread of the virus. SEEK MEDICAL CARE IF:   After the first few days, you feel you are getting worse rather than better.  You need your caregiver's advice about medicines to control symptoms.  You develop chills, worsening shortness of breath, or brown or red sputum. These may be signs of pneumonia.  You develop yellow or brown nasal discharge or pain in the face, especially when you bend forward. These may be signs of sinusitis.  You develop a fever, swollen neck glands, pain with swallowing, or white areas in the back of your throat. These may be signs of strep throat. SEEK IMMEDIATE MEDICAL CARE IF:   You have a fever.  You develop severe or persistent headache, ear pain, sinus pain, or chest pain.  You develop wheezing, a prolonged cough, cough up blood, or have a change in your usual mucus (if you have chronic lung disease).  You develop sore muscles or a stiff neck. Document Released: 11/17/2000 Document Revised: 08/16/2011 Document Reviewed: 08/29/2013 [facility] Patient Information 2015 [place], [place]. This information is not intended to replace advice given to you by your health care provider. Make sure  you discuss any questions you have with your health care provider.

## 2014-09-15 NOTE — ED Provider Notes (Signed)
CSN: [id number]     Arrival date & time 09/15/14  1338 History   First MD Initiated Contact with Patient 09/15/14 1443     Chief Complaint  Patient presents with  . URI   (Consider location/radiation/quality/duration/timing/severity/associated sxs/prior Treatment) Patient is a 29 y.o. female presenting with URI. The history is provided by the patient.  URI Presenting symptoms: congestion, cough, ear pain, fatigue, rhinorrhea and sore throat   Presenting symptoms: no facial pain and no fever   Severity:  Mild Onset quality:  Gradual Duration:  1 day Timing:  Constant Progression:  Unchanged Chronicity:  New Associated symptoms: headaches and myalgias     Past Medical History  Diagnosis Date  . Allergy   . Eczema    Past Surgical History  Procedure Laterality Date  . Cesarean section    . Tooth extraction N/A    Family History  Problem Relation Age of Onset  . Hypertension Paternal Aunt   . Hypertension Paternal Grandmother   . Alcohol abuse Mother   . Drug abuse Mother   . Bipolar disorder Mother   . Cancer Paternal Grandfather   . Prostate cancer Paternal Grandfather    History  Substance Use Topics  . Smoking status: Never Smoker   . Smokeless tobacco: Never Used  . Alcohol Use: No   OB History    Gravida Para Term Preterm AB TAB SAB Ectopic Multiple Living   1 1 1       1      Review of Systems  Constitutional: Positive for fatigue. Negative for fever.  HENT: Positive for congestion, ear pain, rhinorrhea and sore throat.   Eyes: Negative.   Respiratory: Positive for cough.   Cardiovascular: Negative.   Gastrointestinal: Negative.   Musculoskeletal: Positive for myalgias.  Neurological: Positive for headaches.    Allergies  Other  Home Medications   Prior to Admission medications   Medication Sig Start Date End Date Taking? Authorizing Provider  cetirizine (ZYRTEC ALLERGY) 10 MG tablet Take 1 tablet (10 mg total) by mouth daily. 11/15/12   Eber HongBrian  Miller, MD  ipratropium (ATROVENT) 0.06 % nasal spray Place 2 sprays into both nostrils 4 (four) times daily. As needed for nasal congestion 09/15/14   Ria ClockJennifer Lee H Reshonda Koerber, PA  loratadine (CLARITIN) 10 MG tablet Take 1 tablet (10 mg total) by mouth daily. 09/15/14   Daniele Yankowski Lee H Elnora Quizon, PA  PATADAY 0.2 % SOLN Place 1 drop into both eyes daily. 04/12/13   Historical Provider, MD   BP 121/80 mmHg  Pulse 97  Temp(Src) 98.2 F (36.8 C) (Oral)  Resp 16  SpO2 100%  LMP 09/08/2014 Physical Exam  Constitutional: She is oriented to person, place, and time. She appears well-developed and well-nourished. No distress.  HENT:  Head: Normocephalic and atraumatic.  Right Ear: Hearing, tympanic membrane, external ear and ear canal normal.  Left Ear: Hearing, tympanic membrane, external ear and ear canal normal.  Nose: Nose normal.  Mouth/Throat: Uvula is midline, oropharynx is clear and moist and mucous membranes are normal.  Eyes: Conjunctivae are normal. Right eye exhibits no discharge. Left eye exhibits no discharge. No scleral icterus.  Neck: Normal range of motion. Neck supple.  Cardiovascular: Normal rate, regular rhythm and normal heart sounds.   Pulmonary/Chest: Effort normal and breath sounds normal.  Musculoskeletal: Normal range of motion.  Lymphadenopathy:    She has no cervical adenopathy.  Neurological: She is alert and oriented to person, place, and time.  Skin: Skin is  warm and dry.  Psychiatric: She has a normal mood and affect. Her behavior is normal.  Nursing note and vitals reviewed.   ED Course  Procedures (including critical care time) Labs Review Labs Reviewed  POCT RAPID STREP A ([facility]RG CARE ONLY)    Imaging Review No results found.   MDM   1. URI (upper respiratory infection)   Your test for strep throat was negative. Plenty of fluids, rest, tylenol or ibuprofen as directed on packaging for pain/discomfort. Medications as prescribed for nasal congestion and  seasonal allergies. Warm salt water gargles for throat. If throat culture indicates the need for additional treatment, you will be notified by phone.    Ria Clock, [place] 09/15/14 [phone]

## 2014-09-17 LAB — CULTURE, GROUP A STREP: STREP A CULTURE: POSITIVE — AB

## 2014-09-19 ENCOUNTER — Telehealth (HOSPITAL_COMMUNITY): Payer: Self-pay | Admitting: *Deleted

## 2014-09-19 MED ORDER — AMOXICILLIN 500 MG PO CAPS: 500.0000 mg | ORAL_CAPSULE | Freq: Three times a day (TID) | ORAL | Status: DC

## 2014-09-19 NOTE — ED Notes (Addendum)
Throat culture: Group A Strep.  Dr. Konrad DoloresMerrell e-prescribed Amoxicillin ot [facility] [address] I called pt. and VM box full- sent an [facility] notification. Vassie MoselleYork, Lexine Jaspers M 09/19/2014 I called pt. Pt. verified x 2 and given results.  Pt. Told she needs Amoxicillin for strep throat and where to pick up her Rx.  If not better, get rechecked.  Notify contacts. Contagious until you have been on antibiotics for 24 hrs. Vassie MoselleYork, Suzzanne Brunkhorst M 09/20/2014

## 2014-09-30 ENCOUNTER — Telehealth: Payer: Self-pay | Admitting: Family

## 2014-09-30 NOTE — Telephone Encounter (Signed)
Patient is requesting s/p ED f/u and A1C check.  Can I schedule for Wednesday?

## 2014-10-01 ENCOUNTER — Ambulatory Visit (INDEPENDENT_AMBULATORY_CARE_PROVIDER_SITE_OTHER): Payer: BC Managed Care – PPO | Admitting: Family

## 2014-10-01 ENCOUNTER — Encounter: Payer: Self-pay | Admitting: Family

## 2014-10-01 VITALS — BP 100/80 | HR 81 | Temp 98.1°F | Wt 272.0 lb

## 2014-10-01 DIAGNOSIS — R5383 Other fatigue: Secondary | ICD-10-CM | POA: Diagnosis not present

## 2014-10-01 DIAGNOSIS — R739 Hyperglycemia, unspecified: Secondary | ICD-10-CM | POA: Diagnosis not present

## 2014-10-01 DIAGNOSIS — J020 Streptococcal pharyngitis: Secondary | ICD-10-CM

## 2014-10-01 LAB — POCT MONO (EPSTEIN BARR VIRUS): Mono, POC: NEGATIVE

## 2014-10-01 MED ORDER — CEFTRIAXONE SODIUM 1 G IJ SOLR
1.0000 g | Freq: Once | INTRAMUSCULAR | Status: AC
  Administered 2014-10-01: 1 g via INTRAMUSCULAR

## 2014-10-01 MED ORDER — CEFTRIAXONE SODIUM 1 G IJ SOLR: 1.0000 g | INTRAMUSCULAR | Status: DC

## 2014-10-01 NOTE — Telephone Encounter (Signed)
Patient is coming today at 4:15

## 2014-10-01 NOTE — Telephone Encounter (Signed)
Ok to schedule.

## 2014-10-01 NOTE — Patient Instructions (Signed)

## 2014-10-01 NOTE — Progress Notes (Signed)
Pre visit review using our clinic review tool, if applicable. No additional management support is needed unless otherwise documented below in the visit note. 

## 2014-10-01 NOTE — Progress Notes (Signed)
Subjective:    Patient ID: Lauren Powell, female    DOB: 1986-04-20, 29 y.o.   MRN: [id number]  HPI  29 year old African-American female, nonsmoker is in today as a follow-up from the emergency department after being treated for strep pharyngitis with amoxicillin. Continues to have a sore throat. Does have symptom improvement but only has one more day of antibiotics. Denies fever or chills.  Also has a history of hyperglycemia. Last hemoglobin A1c was 5.7. Continues to be obese. Family history of diabetes.    Review of Systems  Constitutional: Negative.  Negative for fever and fatigue.  HENT: Positive for sore throat. Negative for congestion and postnasal drip.   Respiratory: Negative.   Cardiovascular: Negative.   Gastrointestinal: Negative.   Endocrine: Negative.   Musculoskeletal: Negative.   Allergic/Immunologic: Negative.   Hematological: Negative.   Psychiatric/Behavioral: Negative.    Past Medical History  Diagnosis Date  . Allergy   . Eczema     History   Social History  . Marital Status: Single    Spouse Name: N/A  . Number of Children: N/A  . Years of Education: N/A   Occupational History  . Not on file.   Social History Main Topics  . Smoking status: Never Smoker   . Smokeless tobacco: Never Used  . Alcohol Use: No  . Drug Use: No  . Sexual Activity: Yes    Birth Control/ Protection: None   Other Topics Concern  . 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     Past Surgical History  Procedure Laterality Date  . Cesarean section    . Tooth extraction N/A     Family History  Problem Relation Age of Onset  . Hypertension Paternal Aunt   . Hypertension Paternal Grandmother   . Alcohol abuse Mother   . Drug abuse Mother   . Bipolar disorder Mother   . Cancer Paternal Grandfather   . Prostate cancer Paternal Grandfather     Allergies  Allergen  Reactions  . Other     seasonal    Current Outpatient Prescriptions on File Prior to Visit  Medication Sig Dispense Refill  . amoxicillin (AMOXIL) 500 MG capsule Take 1 capsule (500 mg total) by mouth 3 (three) times daily. 20 capsule 0   No current facility-administered medications on file prior to visit.    BP 100/80 mmHg  Pulse 81  Temp(Src) 98.1 F (36.7 C) (Oral)  Wt 272 lb (123.378 kg)  LMP 04/03/2016chart    Objective:   Physical Exam  Constitutional: She is oriented to person, place, and time. She appears well-developed and well-nourished.  HENT:  Right Ear: External ear normal.  Left Ear: External ear normal.  Nose: Nose normal.  No erythematous pharynx. Tonsils 2+. Anterior cervical lymphadenopathy noted.  Neck: Normal range of motion. Neck supple.  Cardiovascular: Normal rate, regular rhythm and normal heart sounds.   Pulmonary/Chest: Effort normal and breath sounds normal.  Neurological: She is alert and oriented to person, place, and time.  Skin: Skin is warm and dry.  Psychiatric: She has a normal mood and affect.          Assessment & Plan:  Lauren Powell was seen today for follow-up.  Diagnoses and all orders for this visit:  Strep pharyngitis Orders: -     cefTRIAXone (ROCEPHIN) injection 1 g; Inject 1 g into the muscle daily. -  POC Mono (Epstein Barr Virus)  Hyperglycemia  Other fatigue Orders: -     POC Mono (Epstein Barr Virus)  Other orders -     Hemoglobin A1c; Standing -     Hemoglobin A1c   Continues to have moderate swelling to the tonsils. We'll treat with a gram of Rocephin IM 1. Advised antihistamine once daily. Call the office if symptoms worsen or persist.

## 2014-10-02 LAB — HEMOGLOBIN A1C: HEMOGLOBIN A1C: 5.9 % (ref 4.6–6.5)

## 2015-01-08 ENCOUNTER — Encounter: Payer: Self-pay | Admitting: Family

## 2015-01-08 ENCOUNTER — Ambulatory Visit (INDEPENDENT_AMBULATORY_CARE_PROVIDER_SITE_OTHER): Payer: BC Managed Care – PPO | Admitting: Family

## 2015-01-08 ENCOUNTER — Other Ambulatory Visit (HOSPITAL_COMMUNITY)
Admission: RE | Admit: 2015-01-08 | Discharge: 2015-01-08 | Disposition: A | Discharge status: Home / Self Care | Payer: BC Managed Care – PPO | Source: Ambulatory Visit | Attending: Family | Admitting: Family

## 2015-01-08 VITALS — BP 102/78 | HR 89 | Temp 98.1°F | Wt 269.0 lb

## 2015-01-08 DIAGNOSIS — N939 Abnormal uterine and vaginal bleeding, unspecified: Secondary | ICD-10-CM

## 2015-01-08 DIAGNOSIS — Z124 Encounter for screening for malignant neoplasm of cervix: Secondary | ICD-10-CM

## 2015-01-08 DIAGNOSIS — R739 Hyperglycemia, unspecified: Secondary | ICD-10-CM | POA: Diagnosis not present

## 2015-01-08 DIAGNOSIS — E669 Obesity, unspecified: Secondary | ICD-10-CM

## 2015-01-08 DIAGNOSIS — N760 Acute vaginitis: Secondary | ICD-10-CM | POA: Diagnosis present

## 2015-01-08 DIAGNOSIS — Z113 Encounter for screening for infections with a predominantly sexual mode of transmission: Secondary | ICD-10-CM | POA: Insufficient documentation

## 2015-01-08 DIAGNOSIS — Z01419 Encounter for gynecological examination (general) (routine) without abnormal findings: Secondary | ICD-10-CM | POA: Diagnosis present

## 2015-01-08 LAB — CBC WITH DIFFERENTIAL/PLATELET
BASOS ABS: 0 10*3/uL (ref 0.0–0.1)
BASOS PCT: 0.7 % (ref 0.0–3.0)
EOS ABS: 0.2 10*3/uL (ref 0.0–0.7)
Eosinophils Relative: 2.4 % (ref 0.0–5.0)
HCT: 37 % (ref 36.0–46.0)
HEMOGLOBIN: 12.1 g/dL (ref 12.0–15.0)
Lymphocytes Relative: 26.9 % (ref 12.0–46.0)
Lymphs Abs: 1.8 10*3/uL (ref 0.7–4.0)
MCHC: 32.8 g/dL (ref 30.0–36.0)
MCV: 79.5 fl (ref 78.0–100.0)
Monocytes Absolute: 0.3 10*3/uL (ref 0.1–1.0)
Monocytes Relative: 4.6 % (ref 3.0–12.0)
Neutro Abs: 4.4 10*3/uL (ref 1.4–7.7)
Neutrophils Relative %: 65.4 % (ref 43.0–77.0)
PLATELETS: 284 10*3/uL (ref 150.0–400.0)
RBC: 4.65 Mil/uL (ref 3.87–5.11)
RDW: 15.2 % (ref 11.5–15.5)
WBC: 6.7 10*3/uL (ref 4.0–10.5)

## 2015-01-08 LAB — POCT URINALYSIS DIPSTICK
Bilirubin, UA: NEGATIVE
GLUCOSE UA: NEGATIVE
KETONES UA: NEGATIVE
Leukocytes, UA: NEGATIVE
Nitrite, UA: NEGATIVE
PH UA: 8
Spec Grav, UA: 1.02
Urobilinogen, UA: 0.2

## 2015-01-08 LAB — T4, FREE: Free T4: 0.87 ng/dL (ref 0.60–1.60)

## 2015-01-08 LAB — COMPREHENSIVE METABOLIC PANEL
ALT: 16 U/L (ref 0–35)
AST: 18 U/L (ref 0–37)
Albumin: 4 g/dL (ref 3.5–5.2)
Alkaline Phosphatase: 67 U/L (ref 39–117)
BILIRUBIN TOTAL: 0.9 mg/dL (ref 0.2–1.2)
BUN: 7 mg/dL (ref 6–23)
CO2: 31 mEq/L (ref 19–32)
Calcium: 9.3 mg/dL (ref 8.4–10.5)
Chloride: 102 mEq/L (ref 96–112)
Creatinine, Ser: 0.78 mg/dL (ref 0.40–1.20)
GFR: 111.36 mL/min (ref >60.00)
Glucose, Bld: 97 mg/dL (ref 70–99)
POTASSIUM: 3.5 meq/L (ref 3.5–5.1)
Sodium: 139 mEq/L (ref 135–145)
Total Protein: 7.4 g/dL (ref 6.0–8.3)

## 2015-01-08 LAB — TSH: TSH: 1.84 u[IU]/mL (ref 0.35–4.50)

## 2015-01-08 LAB — T3, FREE: T3, Free: 4 pg/mL (ref 2.3–4.2)

## 2015-01-08 LAB — POCT URINE PREGNANCY: Preg Test, Ur: NEGATIVE

## 2015-01-08 LAB — HEMOGLOBIN A1C: Hgb A1c MFr Bld: 5.7 % (ref 4.6–6.5)

## 2015-01-08 NOTE — Patient Instructions (Signed)
Abnormal Uterine Bleeding Abnormal uterine bleeding can affect women at various stages in life, including teenagers, women in their reproductive years, pregnant women, and women who have reached menopause. Several kinds of uterine bleeding are considered abnormal, including:  Bleeding or spotting between periods.   Bleeding after sexual intercourse.   Bleeding that is heavier or more than normal.   Periods that last longer than usual.  Bleeding after menopause.  Many cases of abnormal uterine bleeding are minor and simple to treat, while others are more serious. Any type of abnormal bleeding should be evaluated by your health care provider. Treatment will depend on the cause of the bleeding. HOME CARE INSTRUCTIONS Monitor your condition for any changes. The following actions may help to alleviate any discomfort you are experiencing:  Avoid the use of tampons and douches as directed by your health care provider.  Change your pads frequently. You should get regular pelvic exams and Pap tests. Keep all follow-up appointments for diagnostic tests as directed by your health care provider.  SEEK MEDICAL CARE IF:   Your bleeding lasts more than 1 week.   You feel dizzy at times.  SEEK IMMEDIATE MEDICAL CARE IF:   You pass out.   You are changing pads every 15 to 30 minutes.   You have abdominal pain.  You have a fever.   You become sweaty or weak.   You are passing large blood clots from the vagina.   You start to feel nauseous and vomit. MAKE SURE YOU:   Understand these instructions.  Will watch your condition.  Will get help right away if you are not doing well or get worse. Document Released: 05/24/2005 Document Revised: 05/29/2013 Document Reviewed: 12/21/2012 [employer] Patient Information 2015 [employer]. This information is not intended to replace advice given to you by your health care provider. Make sure you discuss any questions you have with your  health care provider.  

## 2015-01-08 NOTE — Progress Notes (Signed)
Subjective:    Patient ID: Lauren Powell, female    DOB: 1985/12/19, 29 y.o.   MRN: [id number]  HPI 29 year old African-American female, nonsmoker with a history of hyperglycemia is in today with complaints of irregular vaginal bleeding over the last 2 months. Her normal menstrual periods are typically 4 days that then resolved. In June reports having a normal menstrual cycle that then repeated itself 3-4 days later. In July, had a normal menstrual cycle then 2 weeks later another cycle. Denies any vaginal discharge or odor. She is married in a monogamous relationship without any concerns of sexually transmitted diseases. Has a family history of hypothyroidism.   Review of Systems  Constitutional: Negative.   Respiratory: Negative.   Cardiovascular: Negative.   Gastrointestinal: Negative.  Negative for abdominal pain.  Endocrine: Negative for heat intolerance, polydipsia, polyphagia and polyuria.  Genitourinary: Positive for vaginal bleeding and menstrual problem. Negative for urgency, decreased urine volume, vaginal discharge and vaginal pain.  Musculoskeletal: Negative.   Allergic/Immunologic: Negative.   Neurological: Negative.   Hematological: Negative.   Psychiatric/Behavioral: Negative.   All other systems reviewed and are negative.  Past Medical History  Diagnosis Date  . Allergy   . Eczema     History   Social History  . Marital Status: Single    Spouse Name: N/A  . Number of Children: N/A  . Years of Education: N/A   Occupational History  . Not on file.   Social History Main Topics  . Smoking status: Never Smoker   . Smokeless tobacco: Never Used  . Alcohol Use: No  . Drug Use: No  . Sexual Activity: Yes    Birth Control/ Protection: None   Other Topics Concern  . 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     Past Surgical History  Procedure Laterality Date  .  Cesarean section    . Tooth extraction N/A     Family History  Problem Relation Age of Onset  . Hypertension Paternal Aunt   . Hypertension Paternal Grandmother   . Alcohol abuse Mother   . Drug abuse Mother   . Bipolar disorder Mother   . Cancer Paternal Grandfather   . Prostate cancer Paternal Grandfather     Allergies  Allergen Reactions  . Other     seasonal    No current outpatient prescriptions on file prior to visit.   No current facility-administered medications on file prior to visit.    BP 102/78 mmHg  Pulse 89  Temp(Src) 98.1 F (36.7 C)  Wt 269 lb (122.018 kg)chart    Objective:   Physical Exam  Constitutional: She is oriented to person, place, and time. She appears well-developed and well-nourished.  Obese  HENT:  Right Ear: External ear normal.  Left Ear: External ear normal.  Nose: Nose normal.  Mouth/Throat: Oropharynx is clear and moist.  Neck: Normal range of motion. Neck supple. No thyromegaly present.  Cardiovascular: Normal rate, regular rhythm and normal heart sounds.   Pulmonary/Chest: Effort normal.  Abdominal: Soft. Bowel sounds are normal.  Genitourinary: Pelvic exam was performed with patient supine. There is no tenderness or lesion on the right labia. There is no tenderness or lesion on the left labia. There is bleeding in the vagina. No erythema or tenderness in the vagina. No foreign body around the vagina.  Musculoskeletal: Normal range of motion.  Neurological: She is alert  and oriented to person, place, and time.  Skin: Skin is warm and dry.  Psychiatric: She has a normal mood and affect.          Assessment & Plan:  Diagnoses and all orders for this visit:  Abnormal vaginal bleeding Orders: -     CMP -     CBC with Differential -     POC Urinalysis Dipstick -     POCT urine pregnancy  Screening for malignant neoplasm of cervix Orders: -     PAP [Mobile]  Screen for sexually transmitted  diseases  Hyperglycemia Orders: -     Hemoglobin A1c -     CMP -     TSH -     Cancel: T3 -     T4, Free  Obesity Orders: -     Hemoglobin A1c -     CMP -     TSH -     Cancel: T3 -     T4, Free   Labs obtained today will notify patient pending results. We are ruling out hyperglycemia, hypothyroidism, screening for sexually transmitted diseases, and neoplasms of the cervix. If all lab results come back and are normal we'll consider transvaginal ultrasound. Patient also high risk for polycystic ovarian syndrome.

## 2015-01-09 LAB — CYTOLOGY - PAP

## 2015-01-10 ENCOUNTER — Telehealth: Payer: Self-pay | Admitting: Family

## 2015-01-10 LAB — CERVICOVAGINAL ANCILLARY ONLY: CANDIDA VAGINITIS: NEGATIVE

## 2015-01-10 NOTE — Telephone Encounter (Signed)
Pt would like results of labs done 8/3 and padonda is the provider.

## 2015-01-13 MED ORDER — METRONIDAZOLE 500 MG PO TABS: 500.0000 mg | ORAL_TABLET | Freq: Two times a day (BID) | ORAL | Status: DC

## 2015-01-13 NOTE — Telephone Encounter (Signed)
Called and left patient message to call office back.

## 2015-01-13 NOTE — Telephone Encounter (Signed)
Called and spoke with patient. Patient is aware of lab results. MD Durene Cal gave verbal to order patient 500 mg Metroidazole (Flaygl) BID 14 tablets and to educate no sex until 7 days after starting medication, no alochol, to have partney tested, and to schedule appointment for STD Screening asap. Patient states will call to make appointment.

## 2015-04-01 ENCOUNTER — Emergency Department (HOSPITAL_COMMUNITY): Payer: BC Managed Care – PPO

## 2015-04-01 ENCOUNTER — Encounter (HOSPITAL_COMMUNITY): Payer: Self-pay | Admitting: Emergency Medicine

## 2015-04-01 ENCOUNTER — Emergency Department (HOSPITAL_COMMUNITY)
Admission: EM | Admit: 2015-04-01 | Discharge: 2015-04-02 | Disposition: A | Discharge status: Home / Self Care | Payer: BC Managed Care – PPO | Attending: Emergency Medicine | Admitting: Emergency Medicine

## 2015-04-01 DIAGNOSIS — R079 Chest pain, unspecified: Secondary | ICD-10-CM | POA: Diagnosis not present

## 2015-04-01 DIAGNOSIS — Z872 Personal history of diseases of the skin and subcutaneous tissue: Secondary | ICD-10-CM | POA: Diagnosis not present

## 2015-04-01 DIAGNOSIS — R5383 Other fatigue: Secondary | ICD-10-CM | POA: Insufficient documentation

## 2015-04-01 DIAGNOSIS — J019 Acute sinusitis, unspecified: Secondary | ICD-10-CM | POA: Diagnosis not present

## 2015-04-01 DIAGNOSIS — R05 Cough: Secondary | ICD-10-CM | POA: Diagnosis present

## 2015-04-01 DIAGNOSIS — Z792 Long term (current) use of antibiotics: Secondary | ICD-10-CM | POA: Insufficient documentation

## 2015-04-01 MED ORDER — DEXAMETHASONE 4 MG PO TABS
10.0000 mg | ORAL_TABLET | Freq: Once | ORAL | Status: AC
  Administered 2015-04-01: 10 mg via ORAL
  Filled 2015-04-01: qty 3

## 2015-04-01 MED ORDER — FLUTICASONE PROPIONATE 50 MCG/ACT NA SUSP: 1.0000 | Freq: Two times a day (BID) | NASAL | Status: DC

## 2015-04-01 MED ORDER — AMOXICILLIN-POT CLAVULANATE 875-125 MG PO TABS: 1.0000 | ORAL_TABLET | Freq: Two times a day (BID) | ORAL | Status: DC

## 2015-04-01 MED ORDER — DEXAMETHASONE 4 MG PO TABS: 10.0000 mg | ORAL_TABLET | Freq: Once | ORAL | Status: DC

## 2015-04-01 NOTE — ED Notes (Signed)
Pt sts cough and pain with cough x 3 days

## 2015-04-01 NOTE — ED Provider Notes (Signed)
CSN: [id number]     Arrival date & time 04/01/15  1703 History   First MD Initiated Contact with Patient 04/01/15 1909     Chief Complaint  Patient presents with  . Cough  . Chest Pain     (Consider location/radiation/quality/duration/timing/severity/associated sxs/prior Treatment) HPI Comments: 29 y.o. Female with no significant past medical history, works as a Manufacturing systems engineerpreschool teacher presents for cough and chest pain.  The patient reports that she has had a cough with sputum production for 1.5 weeks as well as sinus congestion.  She reports that when she does cough she has pain in her chest but no pain when she is not coughing.  She denies shortness of breath during the day but has felt short of breath when she tries to lay down flat at night because she feels like mucus is getting stuck in the back of her throat.  She says her throat feels sore but it does not feel swollen.  No difficulty breathing or swallowing.    Patient is a 29 y.o. female presenting with cough and chest pain.  Cough Associated symptoms: chest pain, myalgias and sore throat   Associated symptoms: no chills, no ear pain, no fever, no headaches, no rash and no rhinorrhea   Chest Pain Associated symptoms: cough and fatigue   Associated symptoms: no abdominal pain, no back pain, no dizziness, no fever, no headache, no nausea, not vomiting and no weakness     Past Medical History  Diagnosis Date  . Allergy   . Eczema    Past Surgical History  Procedure Laterality Date  . Cesarean section    . Tooth extraction N/A    Family History  Problem Relation Age of Onset  . Hypertension Paternal Aunt   . Hypertension Paternal Grandmother   . Alcohol abuse Mother   . Drug abuse Mother   . Bipolar disorder Mother   . Cancer Paternal Grandfather   . Prostate cancer Paternal Grandfather    Social History  Substance Use Topics  . Smoking status: Never Smoker   . Smokeless tobacco: Never Used  . Alcohol Use: No   OB  History    Gravida Para Term Preterm AB TAB SAB Ectopic Multiple Living   1 1 1       1      Review of Systems  Constitutional: Positive for fatigue. Negative for fever, chills and appetite change.  HENT: Positive for congestion, postnasal drip, sinus pressure and sore throat. Negative for ear pain and rhinorrhea.   Respiratory: Positive for cough.   Cardiovascular: Positive for chest pain.  Gastrointestinal: Negative for nausea, vomiting, abdominal pain, diarrhea and constipation.  Genitourinary: Negative for dysuria, urgency and hematuria.  Musculoskeletal: Positive for myalgias. Negative for back pain.  Skin: Negative for rash.  Neurological: Negative for dizziness, weakness, light-headedness and headaches.  Hematological: Does not bruise/bleed easily.      Allergies  Other  Home Medications   Prior to Admission medications   Medication Sig Start Date End Date Taking? Authorizing Provider  metroNIDAZOLE (FLAGYL) 500 MG tablet Take 1 tablet (500 mg total) by mouth 2 (two) times daily. 01/13/15   Shelva MajesticStephen O Hunter, MD   LMP 03/02/2015 Physical Exam  Constitutional: She is oriented to person, place, and time. She appears well-developed and well-nourished. No distress.  HENT:  Head: Normocephalic and atraumatic.  Right Ear: Tympanic membrane and external ear normal.  Left Ear: Tympanic membrane and external ear normal.  Nose: No mucosal edema. Right sinus  exhibits maxillary sinus tenderness and frontal sinus tenderness. Left sinus exhibits maxillary sinus tenderness and frontal sinus tenderness.  Mouth/Throat: Mucous membranes are not pale and not dry. Posterior oropharyngeal erythema present. No oropharyngeal exudate, posterior oropharyngeal edema or tonsillar abscesses.  Poor transillumination of sinuses consistent with congestion.  Post nasal drip noted of posterior pharynx.  Mild exudate of the right tonsil without sign of abscess.  No trismus, normal phonation.  No significant  edema.  Eyes: EOM are normal. Pupils are equal, round, and reactive to light.  Neck: Normal range of motion. Neck supple.  Cardiovascular: Normal rate, regular rhythm, normal heart sounds and intact distal pulses.   No murmur heard. Pulmonary/Chest: Effort normal. No respiratory distress. She has no wheezes. She has no rales.  Abdominal: Soft. She exhibits no distension. There is no tenderness.  Musculoskeletal: Normal range of motion. She exhibits no edema or tenderness.  Neurological: She is alert and oriented to person, place, and time.  Skin: Skin is warm and dry. No rash noted. She is not diaphoretic.  Vitals reviewed.   ED Course  Procedures (including critical care time) Labs Review Labs Reviewed - No data to display  Imaging Review Dg Chest 2 View  04/01/2015  CLINICAL DATA:  Patient with pain across the back and chest wall coughing for 1.5 weeks. EXAM: CHEST  2 VIEW COMPARISON:  Chest radiograph 09/12/2014 FINDINGS: Normal cardiac and mediastinal contours. No consolidative pulmonary opacities. No pleural effusion or pneumothorax. Regional skeleton is unremarkable. IMPRESSION: No acute cardiopulmonary process. Electronically Signed   By: Annia Belt M.D.   On: 04/01/2015 17:40   I have personally reviewed and evaluated these images and lab results as part of my medical decision-making.   EKG Interpretation None      MDM  Patient seen and evaluated in stable condition.  Symptoms appeared most consistent with sinusitis and post nasal drip causing cough.  Chest xray unremarkable.  Patient given dose of decadron.  She was discharged with prescriptions for Augmentin due to length of symptoms (~10 days), flonase.  She was told to follow up with her primary care physician.  All questions answered prior to discharge. Final diagnoses:  None    1. Sinusitis    Leta Baptist, MD 04/02/15 [phone]

## 2015-04-01 NOTE — Discharge Instructions (Signed)
Sinusitis, Adult  Take the medications as prescribed.  Drink lots of fluids, rest, and take tylenol and motrin as needed.  Follow up with your primary care physician.  Eat yogurt and take a probiotic while on the antibiotic.  Sinusitis is redness, soreness, and inflammation of the paranasal sinuses. Paranasal sinuses are air pockets within the bones of your face. They are located beneath your eyes, in the middle of your forehead, and above your eyes. In healthy paranasal sinuses, mucus is able to drain out, and air is able to circulate through them by way of your nose. However, when your paranasal sinuses are inflamed, mucus and air can become trapped. This can allow bacteria and other germs to grow and cause infection. Sinusitis can develop quickly and last only a short time (acute) or continue over a long period (chronic). Sinusitis that lasts for more than 12 weeks is considered chronic. CAUSES Causes of sinusitis include:  Allergies.  Structural abnormalities, such as displacement of the cartilage that separates your nostrils (deviated septum), which can decrease the air flow through your nose and sinuses and affect sinus drainage.  Functional abnormalities, such as when the small hairs (cilia) that line your sinuses and help remove mucus do not work properly or are not present. SIGNS AND SYMPTOMS Symptoms of acute and chronic sinusitis are the same. The primary symptoms are pain and pressure around the affected sinuses. Other symptoms include:  Upper toothache.  Earache.  Headache.  Bad breath.  Decreased sense of smell and taste.  A cough, which worsens when you are lying flat.  Fatigue.  Fever.  Thick drainage from your nose, which often is green and may contain pus (purulent).  Swelling and warmth over the affected sinuses. DIAGNOSIS Your health care provider will perform a physical exam. During your exam, your health care provider may perform any of the following to help  determine if you have acute sinusitis or chronic sinusitis:  Look in your nose for signs of abnormal growths in your nostrils (nasal polyps).  Tap over the affected sinus to check for signs of infection.  View the inside of your sinuses using an imaging device that has a light attached (endoscope). If your health care provider suspects that you have chronic sinusitis, one or more of the following tests may be recommended:  Allergy tests.  Nasal culture. A sample of mucus is taken from your nose, sent to a lab, and screened for bacteria.  Nasal cytology. A sample of mucus is taken from your nose and examined by your health care provider to determine if your sinusitis is related to an allergy. TREATMENT Most cases of acute sinusitis are related to a viral infection and will resolve on their own within 10 days. Sometimes, medicines are prescribed to help relieve symptoms of both acute and chronic sinusitis. These may include pain medicines, decongestants, nasal steroid sprays, or saline sprays. However, for sinusitis related to a bacterial infection, your health care provider will prescribe antibiotic medicines. These are medicines that will help kill the bacteria causing the infection. Rarely, sinusitis is caused by a fungal infection. In these cases, your health care provider will prescribe antifungal medicine. For some cases of chronic sinusitis, surgery is needed. Generally, these are cases in which sinusitis recurs more than 3 times per year, despite other treatments. HOME CARE INSTRUCTIONS  Drink plenty of water. Water helps thin the mucus so your sinuses can drain more easily.  Use a humidifier.  Inhale steam 3-4 times a  day (for example, sit in the bathroom with the shower running).  Apply a warm, moist washcloth to your face 3-4 times a day, or as directed by your health care provider.  Use saline nasal sprays to help moisten and clean your sinuses.  Take medicines only as  directed by your health care provider.  If you were prescribed either an antibiotic or antifungal medicine, finish it all even if you start to feel better. SEEK IMMEDIATE MEDICAL CARE IF:  You have increasing pain or severe headaches.  You have nausea, vomiting, or drowsiness.  You have swelling around your face.  You have vision problems.  You have a stiff neck.  You have difficulty breathing.   This information is not intended to replace advice given to you by your health care provider. Make sure you discuss any questions you have with your health care provider.   Document Released: 05/24/2005 Document Revised: 06/14/2014 Document Reviewed: 06/08/2011 Elsevier Interactive Patient Education [employer]

## 2015-05-29 ENCOUNTER — Ambulatory Visit (INDEPENDENT_AMBULATORY_CARE_PROVIDER_SITE_OTHER): Payer: BC Managed Care – PPO | Admitting: Adult Health

## 2015-05-29 ENCOUNTER — Encounter: Payer: Self-pay | Admitting: Adult Health

## 2015-05-29 VITALS — BP 120/80 | Temp 97.6°F | Ht 65.0 in | Wt 264.9 lb

## 2015-05-29 DIAGNOSIS — R5382 Chronic fatigue, unspecified: Secondary | ICD-10-CM

## 2015-05-29 LAB — CBC WITH DIFFERENTIAL/PLATELET
BASOS ABS: 0 10*3/uL (ref 0.0–0.1)
BASOS PCT: 0.5 % (ref 0.0–3.0)
EOS ABS: 0.4 10*3/uL (ref 0.0–0.7)
Eosinophils Relative: 4.7 % (ref 0.0–5.0)
HEMATOCRIT: 38.7 % (ref 36.0–46.0)
HEMOGLOBIN: 12.3 g/dL (ref 12.0–15.0)
LYMPHS PCT: 28.9 % (ref 12.0–46.0)
Lymphs Abs: 2.5 10*3/uL (ref 0.7–4.0)
MCHC: 31.8 g/dL (ref 30.0–36.0)
MCV: 78.4 fl (ref 78.0–100.0)
MONO ABS: 0.3 10*3/uL (ref 0.1–1.0)
Monocytes Relative: 4 % (ref 3.0–12.0)
Neutro Abs: 5.3 10*3/uL (ref 1.4–7.7)
Neutrophils Relative %: 61.9 % (ref 43.0–77.0)
Platelets: 324 10*3/uL (ref 150.0–400.0)
RBC: 4.93 Mil/uL (ref 3.87–5.11)
RDW: 15.1 % (ref 11.5–15.5)
WBC: 8.6 10*3/uL (ref 4.0–10.5)

## 2015-05-29 LAB — HEMOGLOBIN A1C: HEMOGLOBIN A1C: 6 % (ref 4.6–6.5)

## 2015-05-29 LAB — TSH: TSH: 0.98 u[IU]/mL (ref 0.35–4.50)

## 2015-05-29 NOTE — Patient Instructions (Signed)
It was great meeting you today!  I will release your labs to My Chart when I get them back.   Follow up with me for an establish care visit.

## 2015-05-29 NOTE — Progress Notes (Signed)
   Subjective:    Patient ID: Lauren Powell, female    DOB: 1985/11/06, 29 y.o.   MRN: [id number]  HPI 29 year old female who presents to the office today for lab check, she has been feeling increasingly fatigued. She states " I get home from work and I just crash."   She has no other issues to discuss at this time.   Review of Systems  Constitutional: Positive for fatigue.  HENT: Negative.   Respiratory: Negative.   Cardiovascular: Negative.   Gastrointestinal: Negative.   Musculoskeletal: Negative.   Neurological: Negative.   Hematological: Negative.   Psychiatric/Behavioral: Negative.   All other systems reviewed and are negative.      Objective:   Physical Exam  Constitutional: She is oriented to person, place, and time. She appears well-developed and well-nourished. No distress.  Neck: Normal range of motion. Neck supple. No thyromegaly present.  Cardiovascular: Normal rate, regular rhythm, normal heart sounds and intact distal pulses.  Exam reveals no gallop and no friction rub.   No murmur heard. Pulmonary/Chest: Effort normal and breath sounds normal. No respiratory distress. She has no wheezes. She has no rales. She exhibits no tenderness.  Lymphadenopathy:    She has no cervical adenopathy.  Neurological: She is alert and oriented to person, place, and time.  Skin: Skin is warm and dry. No rash noted. She is not diaphoretic. No erythema. No pallor.  Psychiatric: She has a normal mood and affect. Her behavior is normal. Judgment and thought content normal.  Nursing note and vitals reviewed.      Assessment & Plan:  1. Chronic fatigue - CBC with Differential/Platelet - TSH - Hemoglobin A1c - BMP with eGFR

## 2015-05-29 NOTE — Progress Notes (Signed)
Pre visit review using our clinic review tool, if applicable. No additional management support is needed unless otherwise documented below in the visit note. 

## 2015-05-30 ENCOUNTER — Telehealth: Payer: Self-pay | Admitting: Family

## 2015-05-30 LAB — BASIC METABOLIC PANEL WITH GFR
BUN: 8 mg/dL (ref 7–25)
CALCIUM: 9.2 mg/dL (ref 8.6–10.2)
CO2: 26 mmol/L (ref 20–31)
Chloride: 100 mmol/L (ref 98–110)
Creat: 0.75 mg/dL (ref 0.50–1.10)
GLUCOSE: 100 mg/dL — AB (ref 65–99)
POTASSIUM: 4 mmol/L (ref 3.5–5.3)
SODIUM: 136 mmol/L (ref 135–146)

## 2015-05-30 NOTE — Telephone Encounter (Signed)
Pt said her main concern is about her A1C .She is going out of town and will not be back till the new year

## 2015-05-30 NOTE — Telephone Encounter (Signed)
Spoke with Dr. Durene CalHunter about lab results. Dr. Durene CalHunter states labs from 05/29/15 (CBC, TSH, BMP) were normal minus Glucose slightly elevated. A1C was at 6.0, the highest it has been in 2 years putting her at risk for diabetes. I spoke to patient about Dr. Erasmo LeventhalHunter's interpretation and educated to minimize carbs, sugars, at try to increase exercise. Patient verbalized understanding.

## 2015-07-11 ENCOUNTER — Encounter: Payer: Self-pay | Admitting: Adult Health

## 2015-07-11 ENCOUNTER — Ambulatory Visit: Payer: BC Managed Care – PPO | Admitting: Adult Health

## 2015-07-11 ENCOUNTER — Ambulatory Visit (INDEPENDENT_AMBULATORY_CARE_PROVIDER_SITE_OTHER): Payer: BC Managed Care – PPO | Admitting: Adult Health

## 2015-07-11 VITALS — BP 102/76 | Temp 97.4°F | Ht 65.0 in | Wt 269.2 lb

## 2015-07-11 DIAGNOSIS — N939 Abnormal uterine and vaginal bleeding, unspecified: Secondary | ICD-10-CM | POA: Diagnosis not present

## 2015-07-11 DIAGNOSIS — E669 Obesity, unspecified: Secondary | ICD-10-CM

## 2015-07-11 DIAGNOSIS — Z7189 Other specified counseling: Secondary | ICD-10-CM | POA: Diagnosis not present

## 2015-07-11 DIAGNOSIS — Z7689 Persons encountering health services in other specified circumstances: Secondary | ICD-10-CM

## 2015-07-11 MED ORDER — PHENTERMINE HCL 15 MG PO CAPS: 15.0000 mg | ORAL_CAPSULE | ORAL | Status: DC

## 2015-07-11 NOTE — Progress Notes (Signed)
Pre visit review using our clinic review tool, if applicable. No additional management support is needed unless otherwise documented below in the visit note. 

## 2015-07-11 NOTE — Progress Notes (Signed)
HPI:  Lauren Powell is here to establish care.  She is a pleasant AA female who  has a past medical history of Allergy and Eczema.  Last PCP and physical: 01/2015 with NP Hyman Hopes Immunizations:Does not want flu  Diet: Is working on healthy diet  Exercise: Is working out 2-3 times per week and walks Pap Smear: 01/2015   Has the following chronic problems that require follow up and concerns today:  Obesity - She is worried about her weight and her raising A1c. She has joined a gym and is working out 2-3 times per week. During her son's basketball games she walks around the school. She has started eating healthier and is continuing to work on that. She would like to get some of this weight off to decrease her chances of cardiac disease and diabetes.   Abnormal Uterine Bleeding.  - She has had longer and heavier periods over the last 6 months.Her periods have been lasting 2 weeks at the most. Some days are heavy and some days are light.  She is not currently on any birth control. Was on Mirana in the past and felt like this lightened her periods significantly. She is interested on going back on it.   ROS negative for unless reported above: fevers, chills,feeling poorly, unintentional weight loss, hearing or vision loss, chest pain, palpitations, leg claudication, struggling to breath,Not feeling congested in the chest, no orthopenia, no cough,no wheezing, normal appetite, no soft tissue swelling, no hemoptysis, melena, hematochezia, hematuria, falls, loc, si, or thoughts of self harm.    Past Medical History  Diagnosis Date  . Allergy   . Eczema     Past Surgical History  Procedure Laterality Date  . Cesarean section    . Tooth extraction N/A     Family History  Problem Relation Age of Onset  . Hypertension Paternal Aunt   . Hypertension Paternal Grandmother   . Alcohol abuse Mother   . Drug abuse Mother   . Bipolar disorder Mother   . Cancer Paternal Grandfather   .  Prostate cancer Paternal Grandfather     Social History   Social History  . Marital Status: Single    Spouse Name: N/A  . Number of Children: N/A  . Years of Education: N/A   Social History Main Topics  . Smoking status: Never Smoker   . Smokeless tobacco: Never Used  . Alcohol Use: No  . Drug Use: No  . Sexual Activity: Yes    Birth Control/ Protection: None   Other Topics Concern  . None   Social History Narrative   Patient lives with son, getting married in Feb 2014, works as Lawyer.      Current outpatient prescriptions:  .  fluticasone (FLONASE) 50 MCG/ACT nasal spray, Place 1 spray into both nostrils 2 (two) times daily., Disp: 16 g, Rfl: 0  EXAM:  Filed Vitals:   07/11/15 1320  BP: 102/76  Temp: 97.4 F (36.3 C)    Body mass index is 44.8 kg/(m^2).  GENERAL: vitals reviewed and listed above, alert, oriented, appears well hydrated and in no acute distress. Obese  HEENT: atraumatic, conjunttiva clear, no obvious abnormalities on inspection of external nose and ears  NECK: Neck is soft and supple without masses, no adenopathy or thyromegaly, trachea midline, no JVD. Normal range of motion.   LUNGS: clear to auscultation bilaterally, no wheezes, rales or rhonchi, good air movement  CV: Regular rate and rhythm, normal S1/S2, no audible  murmurs, gallops, or rubs. No carotid bruit and no peripheral edema.   MS: moves all extremities without noticeable abnormality. No edema noted  Abd: soft/nontender/nondistended/normal bowel sounds. Obese  Skin: warm and dry, no rash   Extremities: No clubbing, cyanosis, or edema. Capillary refill is WNL. Pulses intact bilaterally in upper and lower extremities.   Neuro: CN II-XII intact, sensation and reflexes normal throughout, 5/5 muscle strength in bilateral upper and lower extremities. Normal finger to nose. Normal rapid alternating movements. Normal romberg. No pronator drift.   PSYCH: pleasant and  cooperative, no obvious depression or anxiety  ASSESSMENT AND PLAN:  1. Encounter to establish care - Follow up in August for CPE - Follow up sooner if needed - Continue to work on diet and exercise  2. Obesity - phentermine 15 MG capsule; Take 1 capsule (15 mg total) by mouth every morning.  Dispense: 30 capsule; Refill: 0 - Would like to have her down to 262 in one month  3. Abnormal uterine bleeding - Referral to GYN - Weight related?  -We reviewed the PMH, PSH, FH, SH, Meds and Allergies. -We provided refills for any medications we will prescribe as needed. -We addressed current concerns per orders and patient instructions. -We have asked for records for pertinent exams, studies, vaccines and notes from previous providers. -We have advised patient to follow up per instructions below.   -Patient advised to return or notify a provider immediately if symptoms worsen or persist or new concerns arise.   Shirline Frees, AGNP

## 2015-07-11 NOTE — Patient Instructions (Signed)
It was great seeing you today!  Follow up with me for your physical in August.   Follow up with GYN for the abnormal uterine bleeding.   Call back every month with your weight every month. Please let me know if you have any adverse feelings while taking Phentermine.

## 2015-10-04 ENCOUNTER — Emergency Department (HOSPITAL_COMMUNITY)
Admission: EM | Admit: 2015-10-04 | Discharge: 2015-10-04 | Disposition: A | Discharge status: Home / Self Care | Payer: BC Managed Care – PPO | Attending: Emergency Medicine | Admitting: Emergency Medicine

## 2015-10-04 ENCOUNTER — Encounter (HOSPITAL_COMMUNITY): Payer: Self-pay

## 2015-10-04 DIAGNOSIS — Z7951 Long term (current) use of inhaled steroids: Secondary | ICD-10-CM | POA: Diagnosis not present

## 2015-10-04 DIAGNOSIS — B9789 Other viral agents as the cause of diseases classified elsewhere: Secondary | ICD-10-CM

## 2015-10-04 DIAGNOSIS — Z872 Personal history of diseases of the skin and subcutaneous tissue: Secondary | ICD-10-CM | POA: Insufficient documentation

## 2015-10-04 DIAGNOSIS — J069 Acute upper respiratory infection, unspecified: Secondary | ICD-10-CM

## 2015-10-04 DIAGNOSIS — J029 Acute pharyngitis, unspecified: Secondary | ICD-10-CM | POA: Diagnosis present

## 2015-10-04 LAB — RAPID STREP SCREEN (MED CTR MEBANE ONLY): Streptococcus, Group A Screen (Direct): NEGATIVE

## 2015-10-04 MED ORDER — ACETAMINOPHEN 325 MG PO TABS
650.0000 mg | ORAL_TABLET | Freq: Once | ORAL | Status: AC
  Administered 2015-10-04: 650 mg via ORAL

## 2015-10-04 MED ORDER — ACETAMINOPHEN 325 MG PO TABS
ORAL_TABLET | ORAL | Status: AC
  Filled 2015-10-04: qty 2

## 2015-10-04 NOTE — ED Notes (Signed)
Patient here with sore throat, fever and congestion x 1 day. No distress

## 2015-10-04 NOTE — ED Provider Notes (Signed)
CSN: [id number]     Arrival date & time 10/04/15  1531 History   First MD Initiated Contact with Patient 10/04/15 1651     Chief Complaint  Patient presents with  . Sore Throat  . Nasal Congestion     (Consider location/radiation/quality/duration/timing/severity/associated sxs/prior Treatment) HPI  30 year old female with a past history of seasonal allergies presents with sore throat that started last night. Patient states that she took NyQuil and was able to go to sleep. Today she's been having chills all day and has developed a cough with clear sputum. No dyspnea. Gets a headache whenever she is coughing but no current headache. She was febrile in triage to 103. Denies any neck pain or neck stiffness. No chest pain. She is able to swallow and has no trouble talking. No drooling. Chronic nasal congestion that is unchanged. No dizziness/lightheadedness.   Past Medical History  Diagnosis Date  . Allergy   . Eczema    Past Surgical History  Procedure Laterality Date  . Cesarean section    . Tooth extraction N/A    Family History  Problem Relation Age of Onset  . Hypertension Paternal Aunt   . Hypertension Paternal Grandmother   . Alcohol abuse Mother   . Drug abuse Mother   . Bipolar disorder Mother   . Prostate cancer Paternal Grandfather    Social History  Substance Use Topics  . Smoking status: Never Smoker   . Smokeless tobacco: Never Used  . Alcohol Use: No   OB History    Gravida Para Term Preterm AB TAB SAB Ectopic Multiple Living   1 1 1       1      Review of Systems  Constitutional: Positive for fever and chills.  HENT: Positive for congestion and sore throat. Negative for trouble swallowing.   Respiratory: Positive for cough. Negative for shortness of breath.   Gastrointestinal: Negative for vomiting.  Neurological: Negative for dizziness and light-headedness.  All other systems reviewed and are negative.     Allergies  Review of patient's allergies  indicates no active allergies.  Home Medications   Prior to Admission medications   Medication Sig Start Date End Date Taking? Authorizing Provider  DM-Doxylamine-Acetaminophen (NYQUIL COLD & FLU PO) Take 2 capsules by mouth at bedtime as needed (for cold).   Yes Historical Provider, MD  fluticasone (FLONASE) 50 MCG/ACT nasal spray Place 1 spray into both nostrils 2 (two) times daily. 04/01/15   Leta BaptistEmily Roe Nguyen, MD  phentermine 15 MG capsule Take 1 capsule (15 mg total) by mouth every morning. 07/11/15   Shirline Freesory Nafziger, NP   BP 106/65 mmHg  Pulse 105  Temp(Src) 103 F (39.4 C) (Oral)  Resp 18  SpO2 100% Physical Exam  Constitutional: She is oriented to person, place, and time. She appears well-developed and well-nourished.  HENT:  Head: Normocephalic and atraumatic.  Right Ear: External ear normal.  Left Ear: External ear normal.  Nose: Nose normal.  Mouth/Throat: Uvula is midline and mucous membranes are normal. No trismus in the jaw. Posterior oropharyngeal erythema (mild) present. No oropharyngeal exudate, posterior oropharyngeal edema or tonsillar abscesses.  Eyes: Right eye exhibits no discharge. Left eye exhibits no discharge.  Neck: Normal range of motion. Neck supple.  No meningismus  Cardiovascular: Normal rate, regular rhythm and normal heart sounds.   Pulmonary/Chest: Effort normal and breath sounds normal.  Abdominal: Soft. There is no tenderness.  Neurological: She is alert and oriented to person, place, and time.  Skin: Skin is warm and dry.  Nursing note and vitals reviewed.   ED Course  Procedures (including critical care time) Labs Review Labs Reviewed  RAPID STREP SCREEN (NOT AT [facility])  CULTURE, GROUP A STREP [facility])    Imaging Review No results found. I have personally reviewed and evaluated these images and lab results as part of my medical decision-making.   EKG Interpretation None      MDM   Final diagnoses:  Viral upper respiratory tract  infection with cough    Patient appears quite well here. Appears to have a viral upper respiratory infection. Strep screening is negative. No signs of tonsillar abscess or pharyngeal swelling. Has a cough but clear lungs. Highly doubt pneumonia. Plan to treat with over-the-counter medicines and follow-up with PCP. Discussed strict return precautions.    Pricilla Loveless, MD 10/04/15 [phone]

## 2015-10-07 LAB — CULTURE, GROUP A STREP (THRC)

## 2016-03-20 ENCOUNTER — Ambulatory Visit: Payer: BC Managed Care – PPO | Admitting: Family Medicine

## 2016-07-30 ENCOUNTER — Encounter: Payer: Self-pay | Admitting: Obstetrics

## 2016-07-30 ENCOUNTER — Ambulatory Visit (INDEPENDENT_AMBULATORY_CARE_PROVIDER_SITE_OTHER): Payer: BC Managed Care – PPO | Admitting: Obstetrics

## 2016-07-30 VITALS — BP 120/86 | HR 76 | Ht 64.0 in | Wt 263.0 lb

## 2016-07-30 DIAGNOSIS — Z3009 Encounter for other general counseling and advice on contraception: Secondary | ICD-10-CM

## 2016-07-30 DIAGNOSIS — Z0000 Encounter for general adult medical examination without abnormal findings: Secondary | ICD-10-CM | POA: Diagnosis not present

## 2016-07-30 DIAGNOSIS — Z01419 Encounter for gynecological examination (general) (routine) without abnormal findings: Secondary | ICD-10-CM | POA: Diagnosis not present

## 2016-07-30 DIAGNOSIS — Z124 Encounter for screening for malignant neoplasm of cervix: Secondary | ICD-10-CM

## 2016-07-30 DIAGNOSIS — Z113 Encounter for screening for infections with a predominantly sexual mode of transmission: Secondary | ICD-10-CM

## 2016-07-30 NOTE — Progress Notes (Signed)
Subjective:        Lauren Powell is a 31 y.o. female here for a routine exam.  Current complaints: None.    Personal health questionnaire:  Is patient Ashkenazi Jewish, have a family history of breast and/or ovarian cancer: no Is there a family history of uterine cancer diagnosed at age < 48, gastrointestinal cancer, urinary tract cancer, family member who is a Personnel officer syndrome-associated carrier: no Is the patient overweight and hypertensive, family history of diabetes, personal history of gestational diabetes, preeclampsia or PCOS: no Is patient over 52, have PCOS,  family history of premature CHD under age 34, diabetes, smoke, have hypertension or peripheral artery disease:  no At any time, has a partner hit, kicked or otherwise hurt or frightened you?: no Over the past 2 weeks, have you felt down, depressed or hopeless?: no Over the past 2 weeks, have you felt little interest or pleasure in doing things?:no   Gynecologic History Patient's last menstrual period was 07/15/2016 (exact date). Contraception: none Last Pap: 2016. Results were: normal Last mammogram: n/a. Results were: n/a  Obstetric History OB History  Gravida Para Term Preterm AB Living  1 1 1     1   SAB TAB Ectopic Multiple Live Births               # Outcome Date GA Lbr Len/2nd Weight Sex Delivery Anes PTL Lv  1 Term               Past Medical History:  Diagnosis Date  . Allergy   . Eczema     Past Surgical History:  Procedure Laterality Date  . CESAREAN SECTION    . TOOTH EXTRACTION N/A     No current outpatient prescriptions on file. No Active Allergies  Social History  Substance Use Topics  . Smoking status: Never Smoker  . Smokeless tobacco: Never Used  . Alcohol use No    Family History  Problem Relation Age of Onset  . Alcohol abuse Mother   . Drug abuse Mother   . Bipolar disorder Mother   . Hypertension Paternal Aunt   . Hypertension Paternal Grandmother   . Prostate  cancer Paternal Grandfather       Review of Systems  Constitutional: negative for fatigue and weight loss Respiratory: negative for cough and wheezing Cardiovascular: negative for chest pain, fatigue and palpitations Gastrointestinal: negative for abdominal pain and change in bowel habits Musculoskeletal:negative for myalgias Neurological: negative for gait problems and tremors Behavioral/Psych: negative for abusive relationship, depression Endocrine: negative for temperature intolerance    Genitourinary:negative for abnormal menstrual periods, genital lesions, hot flashes, sexual problems and vaginal discharge Integument/breast: negative for breast lump, breast tenderness, nipple discharge and skin lesion(s)    Objective:       BP 120/86   Pulse 76   Ht 5\' 4"  (1.626 m)   Wt 263 lb (119.3 kg)   LMP 07/15/2016 (Exact Date) Comment: irregular- can skip months  BMI 45.14 kg/m  General:   alert  Skin:   no rash or abnormalities  Lungs:   clear to auscultation bilaterally  Heart:   regular rate and rhythm, S1, S2 normal, no murmur, click, rub or gallop  Breasts:   normal without suspicious masses, skin or nipple changes or axillary nodes  Abdomen:  normal findings: no organomegaly, soft, non-tender and no hernia  Pelvis:  External genitalia: normal general appearance Urinary system: urethral meatus normal and bladder without fullness, nontender Vaginal: normal without  tenderness, induration or masses Cervix: normal appearance Adnexa: normal bimanual exam Uterus: anteverted and non-tender, normal size   Lab Review Urine pregnancy test Labs reviewed yes Radiologic studies reviewed no  50% of 20 min visit spent on counseling and coordination of care.    Assessment:    Healthy female exam.    Contraceptive Counseling and Advice.  Wants Mirena IUD   Plan:    Education reviewed: calcium supplements, low fat, low cholesterol diet, safe sex/STD prevention, self breast exams  and weight bearing exercise. Contraception: IUD. Follow up in: 08-20-2016 when on period.   No orders of the defined types were placed in this encounter.  No orders of the defined types were placed in this encounter.    Patient ID: Roda Shuttersiara J Baker-Sellers, female   DOB: 1986-05-16, [age] y.o.   MRN: [id number]

## 2016-07-30 NOTE — Progress Notes (Signed)
Patient is in the office today for her pap and to discuss birth control. She is interested in IUD.

## 2016-08-02 LAB — CYTOLOGY - PAP
Diagnosis: NEGATIVE
HPV (WINDOPATH): NOT DETECTED

## 2016-08-02 LAB — CERVICOVAGINAL ANCILLARY ONLY
BACTERIAL VAGINITIS: NEGATIVE
CANDIDA VAGINITIS: NEGATIVE
Chlamydia: NEGATIVE
NEISSERIA GONORRHEA: NEGATIVE
Trichomonas: NEGATIVE

## 2016-08-20 ENCOUNTER — Ambulatory Visit: Payer: BC Managed Care – PPO | Admitting: Obstetrics

## 2016-08-20 ENCOUNTER — Encounter: Payer: Self-pay | Admitting: Obstetrics

## 2016-08-20 VITALS — BP 137/86 | HR 80 | Wt 263.0 lb

## 2016-08-20 DIAGNOSIS — Z3009 Encounter for other general counseling and advice on contraception: Secondary | ICD-10-CM

## 2016-08-20 DIAGNOSIS — Z3169 Encounter for other general counseling and advice on procreation: Secondary | ICD-10-CM

## 2016-08-20 DIAGNOSIS — Z30011 Encounter for initial prescription of contraceptive pills: Secondary | ICD-10-CM

## 2016-08-20 MED ORDER — NORETHINDRONE-ETH ESTRADIOL 1-35 MG-MCG PO TABS: 1.0000 | ORAL_TABLET | Freq: Every day | ORAL | 11 refills | Status: DC

## 2016-08-20 NOTE — Progress Notes (Signed)
Pt orginially scheduled for IUD prefers BC pills.

## 2016-08-20 NOTE — Progress Notes (Signed)
Subjective:    Lauren Powell is a 31 y.o. female who presents for contraception counseling. The patient has no complaints today. The patient is sexually active. Pertinent past medical history: none.  The information documented in the HPI was reviewed and verified.  Menstrual History: OB History    Gravida Para Term Preterm AB Living   1 1 1     1    SAB TAB Ectopic Multiple Live Births                  No LMP recorded (lmp unknown).   Patient Active Problem List   Diagnosis Date Noted  . Hyperglycemia 10/01/2014  . Pain of right heel 07/18/2012  . Obesity 06/27/2012  . Well woman exam with routine gynecological exam 06/27/2012   Past Medical History:  Diagnosis Date  . Allergy   . Eczema     Past Surgical History:  Procedure Laterality Date  . CESAREAN SECTION    . TOOTH EXTRACTION N/A      Current Outpatient Prescriptions:  .  norethindrone-ethinyl estradiol 1/35 (ORTHO-NOVUM 1/35, 28,) tablet, Take 1 tablet by mouth daily., Disp: 1 Package, Rfl: 11 No Known Allergies  Social History  Substance Use Topics  . Smoking status: Never Smoker  . Smokeless tobacco: Never Used  . Alcohol use No    Family History  Problem Relation Age of Onset  . Alcohol abuse Mother   . Drug abuse Mother   . Bipolar disorder Mother   . Hypertension Paternal Aunt   . Hypertension Paternal Grandmother   . Prostate cancer Paternal Grandfather        Review of Systems Constitutional: negative for weight loss Genitourinary:negative for abnormal menstrual periods and vaginal discharge   Objective:   BP 137/86   Pulse 80   Wt 263 lb (119.3 kg)   LMP  (LMP Unknown)   BMI 45.14 kg/m    PE:  Deferred   Lab Review Urine pregnancy test Labs reviewed yes Radiologic studies reviewed no  >50% of 10 min visit spent on counseling and coordination of care.    Assessment:    31 y.o., starting OCP (estrogen/progesterone), no contraindications.    Plans pregnancy over the  next few years.  Preconception counseling done.for Folic Acid maintenance.  Plan:    All questions answered. Contraception: OCP (estrogen/progesterone). Discussed healthy lifestyle modifications. Follow up in 1 year.    Meds ordered this encounter  Medications  . norethindrone-ethinyl estradiol 1/35 (ORTHO-NOVUM 1/35, 28,) tablet    Sig: Take 1 tablet by mouth daily.    Dispense:  1 Package    Refill:  11   No orders of the defined types were placed in this encounter.

## 2016-12-29 ENCOUNTER — Ambulatory Visit (INDEPENDENT_AMBULATORY_CARE_PROVIDER_SITE_OTHER): Payer: BC Managed Care – PPO | Admitting: Adult Health

## 2016-12-29 ENCOUNTER — Encounter: Payer: Self-pay | Admitting: Adult Health

## 2016-12-29 VITALS — BP 118/70 | HR 87 | Temp 97.7°F | Ht 64.0 in | Wt 264.0 lb

## 2016-12-29 DIAGNOSIS — Z713 Dietary counseling and surveillance: Secondary | ICD-10-CM | POA: Diagnosis not present

## 2016-12-29 DIAGNOSIS — Z0000 Encounter for general adult medical examination without abnormal findings: Secondary | ICD-10-CM | POA: Diagnosis not present

## 2016-12-29 LAB — CBC WITH DIFFERENTIAL/PLATELET
BASOS ABS: 0.1 10*3/uL (ref 0.0–0.1)
Basophils Relative: 0.9 % (ref 0.0–3.0)
Eosinophils Absolute: 0.1 10*3/uL (ref 0.0–0.7)
Eosinophils Relative: 1.8 % (ref 0.0–5.0)
HCT: 38.6 % (ref 36.0–46.0)
Hemoglobin: 12.1 g/dL (ref 12.0–15.0)
LYMPHS ABS: 2.5 10*3/uL (ref 0.7–4.0)
Lymphocytes Relative: 32.6 % (ref 12.0–46.0)
MCHC: 31.3 g/dL (ref 30.0–36.0)
MCV: 80.1 fl (ref 78.0–100.0)
MONO ABS: 0.4 10*3/uL (ref 0.1–1.0)
Monocytes Relative: 4.6 % (ref 3.0–12.0)
NEUTROS PCT: 60.1 % (ref 43.0–77.0)
Neutro Abs: 4.6 10*3/uL (ref 1.4–7.7)
PLATELETS: 316 10*3/uL (ref 150.0–400.0)
RBC: 4.81 Mil/uL (ref 3.87–5.11)
RDW: 15.4 % (ref 11.5–15.5)
WBC: 7.7 10*3/uL (ref 4.0–10.5)

## 2016-12-29 LAB — BASIC METABOLIC PANEL
BUN: 9 mg/dL (ref 6–23)
CO2: 28 mEq/L (ref 19–32)
Calcium: 9.5 mg/dL (ref 8.4–10.5)
Chloride: 104 mEq/L (ref 96–112)
Creatinine, Ser: 0.91 mg/dL (ref 0.40–1.20)
GFR: 92.02 mL/min (ref >60.00)
GLUCOSE: 99 mg/dL (ref 70–99)
Potassium: 4 mEq/L (ref 3.5–5.1)
Sodium: 139 mEq/L (ref 135–145)

## 2016-12-29 LAB — TSH: TSH: 1.29 u[IU]/mL (ref 0.35–4.50)

## 2016-12-29 LAB — HEPATIC FUNCTION PANEL
ALK PHOS: 60 U/L (ref 39–117)
ALT: 12 U/L (ref 0–35)
AST: 14 U/L (ref 0–37)
Albumin: 4.1 g/dL (ref 3.5–5.2)
BILIRUBIN TOTAL: 0.7 mg/dL (ref 0.2–1.2)
Bilirubin, Direct: 0.1 mg/dL (ref 0.0–0.3)
Total Protein: 7.4 g/dL (ref 6.0–8.3)

## 2016-12-29 LAB — LIPID PANEL
Cholesterol: 149 mg/dL (ref 0–200)
HDL: 55.1 mg/dL (ref >39.00)
LDL Cholesterol: 84 mg/dL (ref 0–99)
NONHDL: 93.55
TRIGLYCERIDES: 47 mg/dL (ref 0.0–149.0)
Total CHOL/HDL Ratio: 3
VLDL: 9.4 mg/dL (ref 0.0–40.0)

## 2016-12-29 LAB — HEMOGLOBIN A1C: Hgb A1c MFr Bld: 5.8 % (ref 4.6–6.5)

## 2016-12-29 MED ORDER — PHENTERMINE HCL 15 MG PO CAPS: 15.0000 mg | ORAL_CAPSULE | ORAL | 0 refills | Status: DC

## 2016-12-29 NOTE — Patient Instructions (Signed)
It was great seeing today   I will follow up with you regarding your lab work   Please see me in one month for weight loss management

## 2016-12-29 NOTE — Progress Notes (Signed)
Subjective:    Patient ID: Lauren Powell, female    DOB: February 01, 1986, 31 y.o.   MRN: [id number]  HPI  Patient presents for yearly preventative medicine examination. She is a pleasant 31 year old female who  has a past medical history of Allergy; Eczema; and Obesity.  All immunizations and health maintenance protocols were reviewed with the patient and needed orders were placed.  Appropriate screening laboratory values were ordered for the patient including screening of hyperlipidemia, renal function and hepatic function.  Medication reconciliation,  past medical history, social history, problem list and allergies were reviewed in detail with the patient  Goals were established with regard to weight loss, exercise, and  diet in compliance with medications. She reports that she has started walking more often and is drinking a lot of water. She would like to try phentermine. She never had it filled.   She has been seen by her GYN this year.    Review of Systems  Constitutional: Negative.   HENT: Negative.   Eyes: Negative.   Respiratory: Negative.   Cardiovascular: Negative.   Gastrointestinal: Negative.   Endocrine: Negative.   Genitourinary: Negative.   Musculoskeletal: Negative.   Skin: Negative.   Allergic/Immunologic: Negative.   Neurological: Negative.   Hematological: Negative.   Psychiatric/Behavioral: Negative.   All other systems reviewed and are negative.  Past Medical History:  Diagnosis Date  . Allergy   . Eczema   . Obesity     Social History   Social History  . Marital status: Single    Spouse name: N/A  . Number of children: N/A  . Years of education: N/A   Occupational History  . Not on file.   Social History Main Topics  . Smoking status: Never Smoker  . Smokeless tobacco: Never Used  . Alcohol use No  . Drug use: No  . Sexual activity: Yes    Partners: Male    Birth control/ protection: None   Other Topics Concern  . Not on file     Social History Narrative   Patient lives with son   Teaches pre school and kindergarten.        Past Surgical History:  Procedure Laterality Date  . CESAREAN SECTION    . TOOTH EXTRACTION N/A     Family History  Problem Relation Age of Onset  . Alcohol abuse Mother   . Drug abuse Mother   . Bipolar disorder Mother   . Hypertension Paternal Aunt   . Hypertension Paternal Grandmother   . Prostate cancer Paternal Grandfather     No Known Allergies  Current Outpatient Prescriptions on File Prior to Visit  Medication Sig Dispense Refill  . norethindrone-ethinyl estradiol 1/35 (ORTHO-NOVUM 1/35, 28,) tablet Take 1 tablet by mouth daily. 1 Package 11   No current facility-administered medications on file prior to visit.     BP 118/70 (BP Location: Left Arm, Patient Position: Sitting, Cuff Size: Large)   Pulse 87   Temp 97.7 F (36.5 C) (Oral)   Ht 5\' 4"  (1.626 m)   Wt 264 lb (119.7 kg)   LMP 12/18/2016 (Exact Date)   SpO2 99%   BMI 45.32 kg/m       Objective:   Physical Exam  Constitutional: She is oriented to person, place, and time. She appears well-developed and well-nourished. No distress.  HENT:  Head: Normocephalic and atraumatic.  Right Ear: External ear normal.  Left Ear: External ear normal.  Nose: Nose normal.  Mouth/Throat: Oropharynx is clear and moist. No oropharyngeal exudate.  Eyes: Pupils are equal, round, and reactive to light. Conjunctivae and EOM are normal. Right eye exhibits no discharge. Left eye exhibits no discharge. No scleral icterus.  Neck: Normal range of motion. Neck supple. No JVD present. No tracheal deviation present. No thyromegaly present.  Cardiovascular: Normal rate, regular rhythm, normal heart sounds and intact distal pulses.  Exam reveals no gallop and no friction rub.   No murmur heard. Pulmonary/Chest: Effort normal and breath sounds normal. No stridor. No respiratory distress. She has no wheezes. She has no rales. She  exhibits no tenderness.  Abdominal: Soft. Bowel sounds are normal. She exhibits no distension and no mass. There is no tenderness. There is no rebound and no guarding.  Obesity    Genitourinary:  Genitourinary Comments: Done by GYN   Musculoskeletal: Normal range of motion. She exhibits no edema, tenderness or deformity.  Lymphadenopathy:    She has no cervical adenopathy.  Neurological: She is alert and oriented to person, place, and time. She has normal reflexes. She displays normal reflexes. No cranial nerve deficit. She exhibits normal muscle tone. Coordination normal.  Skin: Skin is warm and dry. No rash noted. She is not diaphoretic. No erythema. No pallor.  Psychiatric: She has a normal mood and affect. Her behavior is normal. Judgment and thought content normal.  Nursing note and vitals reviewed.     Assessment & Plan:  1. Routine general medical examination at a health care facility - Reviewed general health maintenance. She is up to date - Continue to work on diet and exercise  - Basic metabolic panel - CBC with Differential/Platelet - Hepatic function panel - Hemoglobin A1c - Lipid panel - TSH - HIV antibody  2. Weight loss counseling, encounter for - Will start on phentermine. Continue to work on diet and exercise.  - Basic metabolic panel - CBC with Differential/Platelet - Hepatic function panel - Hemoglobin A1c - Lipid panel - TSH - HIV antibody - phentermine 15 MG capsule; Take 1 capsule (15 mg total) by mouth every morning.  Dispense: 30 capsule; Refill: 0  Shirline Freesory Melitta Tigue, NP

## 2016-12-30 LAB — HIV ANTIBODY (ROUTINE TESTING W REFLEX): HIV: NONREACTIVE

## 2017-01-03 ENCOUNTER — Telehealth: Payer: Self-pay

## 2017-01-03 NOTE — Telephone Encounter (Signed)
PA approved, form faxed back to pharmacy. 

## 2017-01-03 NOTE — Telephone Encounter (Signed)
Received PA request for Phentermine. PA submitted & pending. Key: [id number]

## 2017-02-16 ENCOUNTER — Telehealth: Payer: Self-pay | Admitting: Adult Health

## 2017-02-17 ENCOUNTER — Ambulatory Visit (INDEPENDENT_AMBULATORY_CARE_PROVIDER_SITE_OTHER): Payer: BC Managed Care – PPO | Admitting: Adult Health

## 2017-02-17 ENCOUNTER — Encounter: Payer: Self-pay | Admitting: Adult Health

## 2017-02-17 DIAGNOSIS — Z713 Dietary counseling and surveillance: Secondary | ICD-10-CM | POA: Diagnosis not present

## 2017-02-17 MED ORDER — PHENTERMINE HCL 15 MG PO CAPS: 15.0000 mg | ORAL_CAPSULE | ORAL | 0 refills | Status: DC

## 2017-02-17 NOTE — Progress Notes (Signed)
Subjective:    Patient ID: Lauren Powell, female    DOB: 09-28-1985, 31 y.o.   MRN: [id number]  HPI   31 year old female who  has a past medical history of Allergy; Eczema; and Obesity. She presents to the clinic today to discuss weight loss. During her last visit the decision was made to start her on 15 mg of Phentermine. Today in the office she is very proud that she has lost 7 pounds. She is not eating fried foods, is eating more fruits and vegetables and drinking more water. She is walking every morning and is doing dance class three days a week.   She has noticed a difference in her energy level and has noticed changes in her clothes, as they are fitting looser.   She denies any side effects of medication   Wt Readings from Last 3 Encounters:  02/17/17 257 lb 12.8 oz (116.9 kg)  12/29/16 264 lb (119.7 kg)  08/20/16 263 lb (119.3 kg)    48 inch waist during today's visit   Review of Systems See HPI   Past Medical History:  Diagnosis Date  . Allergy   . Eczema   . Obesity     Social History   Social History  . Marital status: Single    Spouse name: N/A  . Number of children: N/A  . Years of education: N/A   Occupational History  . Not on file.   Social History Main Topics  . Smoking status: Never Smoker  . Smokeless tobacco: Never Used  . Alcohol use No  . Drug use: No  . Sexual activity: Yes    Partners: Male    Birth control/ protection: None   Other Topics Concern  . Not on file   Social History Narrative   Patient lives with son   Teaches pre school and kindergarten.        Past Surgical History:  Procedure Laterality Date  . CESAREAN SECTION    . TOOTH EXTRACTION N/A     Family History  Problem Relation Age of Onset  . Alcohol abuse Mother   . Drug abuse Mother   . Bipolar disorder Mother   . Hypertension Paternal Aunt   . Hypertension Paternal Grandmother   . Prostate cancer Paternal Grandfather     No Known  Allergies  Current Outpatient Prescriptions on File Prior to Visit  Medication Sig Dispense Refill  . norethindrone-ethinyl estradiol 1/35 (ORTHO-NOVUM 1/35, 28,) tablet Take 1 tablet by mouth daily. 1 Package 11   No current facility-administered medications on file prior to visit.     BP 116/72   Pulse 84   Temp 98.4 F (36.9 C) (Oral)   Wt 257 lb 12.8 oz (116.9 kg)   SpO2 98%   BMI 44.25 kg/m       Objective:   Physical Exam  Constitutional: She is oriented to person, place, and time. She appears well-developed and well-nourished. No distress.  Cardiovascular: Normal rate, regular rhythm, normal heart sounds and intact distal pulses.  Exam reveals no gallop and no friction rub.   No murmur heard. Pulmonary/Chest: Effort normal and breath sounds normal. No respiratory distress. She has no wheezes. She has no rales. She exhibits no tenderness.  Neurological: She is alert and oriented to person, place, and time.  Skin: Skin is warm and dry. No rash noted. She is not diaphoretic. No erythema. No pallor.  Psychiatric: She has a normal mood and affect. Her behavior  is normal. Judgment and thought content normal.  Nursing note and vitals reviewed.     Assessment & Plan:  1. Weight loss counseling, encounter for - phentermine 15 MG capsule; Take 1 capsule (15 mg total) by mouth every morning.  Dispense: 30 capsule; Refill: 0 - Continue with current regimen. Try increasing walking intensity and distance  - Follow up in one month   Shirline Freesory Drisana Schweickert, NP

## 2017-02-22 NOTE — Telephone Encounter (Signed)
Error

## 2017-02-25 ENCOUNTER — Encounter: Payer: Self-pay | Admitting: Adult Health

## 2017-03-31 ENCOUNTER — Encounter: Payer: Self-pay | Admitting: Adult Health

## 2017-03-31 ENCOUNTER — Ambulatory Visit (INDEPENDENT_AMBULATORY_CARE_PROVIDER_SITE_OTHER): Payer: BC Managed Care – PPO | Admitting: Adult Health

## 2017-03-31 VITALS — BP 122/90 | Temp 98.4°F | Wt 252.0 lb

## 2017-03-31 DIAGNOSIS — J302 Other seasonal allergic rhinitis: Secondary | ICD-10-CM

## 2017-03-31 DIAGNOSIS — Z713 Dietary counseling and surveillance: Secondary | ICD-10-CM

## 2017-03-31 DIAGNOSIS — L309 Dermatitis, unspecified: Secondary | ICD-10-CM

## 2017-03-31 MED ORDER — FLUTICASONE PROPIONATE 50 MCG/ACT NA SUSP: 2.0000 | Freq: Every day | NASAL | 6 refills | Status: DC

## 2017-03-31 MED ORDER — PHENTERMINE HCL 15 MG PO CAPS: 15.0000 mg | ORAL_CAPSULE | ORAL | 0 refills | Status: DC

## 2017-03-31 MED ORDER — OLOPATADINE HCL 0.2 % OP SOLN: 1.0000 [drp] | Freq: Every day | OPHTHALMIC | 6 refills | Status: DC

## 2017-03-31 MED ORDER — MOMETASONE FUROATE 0.1 % EX CREA: 1.0000 "application " | TOPICAL_CREAM | Freq: Every day | CUTANEOUS | 0 refills | Status: DC

## 2017-03-31 NOTE — Progress Notes (Signed)
Subjective:    Patient ID: Lauren Powell, female    DOB: Jul 24, 1985, 31 y.o.   MRN: [id number]  HPI  31 year old female who  has a past medical history of Allergy; Eczema; and Obesity. She presents to the office today for the acute complaint of nasal congestion x 2 weeks. She reports sinus pain and pressure and itchy watery eyes. She feels as though her symptoms are caused by seasonal allergies.   She denies fevers, headaches, or feeling ill.   Her eczema is also starting to " act up" on her arms.   She continues to use Phentermine 15 mg and has lost an additional 5 pounds. This will be her third month on the medication. Denies any side effects.  Wt Readings from Last 3 Encounters:  03/31/17 252 lb (114.3 kg)  02/17/17 257 lb 12.8 oz (116.9 kg)  12/29/16 264 lb (119.7 kg)    Review of Systems See HPI   Past Medical History:  Diagnosis Date  . Allergy   . Eczema   . Obesity     Social History   Social History  . Marital status: Single    Spouse name: N/A  . Number of children: N/A  . Years of education: N/A   Occupational History  . Not on file.   Social History Main Topics  . Smoking status: Never Smoker  . Smokeless tobacco: Never Used  . Alcohol use No  . Drug use: No  . Sexual activity: Yes    Partners: Male    Birth control/ protection: None   Other Topics Concern  . Not on file   Social History Narrative   Patient lives with son   Teaches pre school and kindergarten.        Past Surgical History:  Procedure Laterality Date  . CESAREAN SECTION    . TOOTH EXTRACTION N/A     Family History  Problem Relation Age of Onset  . Alcohol abuse Mother   . Drug abuse Mother   . Bipolar disorder Mother   . Hypertension Paternal Aunt   . Hypertension Paternal Grandmother   . Prostate cancer Paternal Grandfather     No Known Allergies  Current Outpatient Prescriptions on File Prior to Visit  Medication Sig Dispense Refill  .  norethindrone-ethinyl estradiol 1/35 (ORTHO-NOVUM 1/35, 28,) tablet Take 1 tablet by mouth daily. 1 Package 11  . phentermine 15 MG capsule Take 1 capsule (15 mg total) by mouth every morning. 30 capsule 0   No current facility-administered medications on file prior to visit.     BP 122/90 (BP Location: Left Arm)   Temp 98.4 F (36.9 C) (Oral)   Wt 252 lb (114.3 kg)   BMI 43.26 kg/m       Objective:   Physical Exam  Constitutional: She is oriented to person, place, and time. She appears well-developed and well-nourished. No distress.  HENT:  Head: Normocephalic and atraumatic.  Right Ear: External ear normal.  Left Ear: External ear normal.  Nose: Mucosal edema, rhinorrhea and sinus tenderness present. Right sinus exhibits frontal sinus tenderness. Right sinus exhibits no maxillary sinus tenderness. Left sinus exhibits frontal sinus tenderness. Left sinus exhibits no maxillary sinus tenderness.  Mouth/Throat: No oropharyngeal exudate.  Eyes: Pupils are equal, round, and reactive to light. Conjunctivae and EOM are normal. Right eye exhibits discharge. Left eye exhibits discharge.  Cardiovascular: Normal rate, regular rhythm, normal heart sounds and intact distal pulses.  Exam reveals no gallop  and no friction rub.   No murmur heard. Pulmonary/Chest: Effort normal and breath sounds normal. No respiratory distress. She has no wheezes. She has no rales. She exhibits no tenderness.  Neurological: She is alert and oriented to person, place, and time.  Skin: Skin is warm and dry. No rash noted. There is erythema (eczema rash on bilateral arms ). No pallor.  Psychiatric: She has a normal mood and affect. Her behavior is normal. Judgment and thought content normal.  Nursing note and vitals reviewed.      Assessment & Plan:  1. Weight loss counseling, encounter for - losing weight with this current dose. - Continue to diet and exercise  - phentermine 15 MG capsule; Take 1 capsule (15 mg  total) by mouth every morning.  Dispense: 30 capsule; Refill: 0 - Follow up in one month  2. Eczema, unspecified type  - mometasone (ELOCON) 0.1 % cream; Apply 1 application topically daily.  Dispense: 45 g; Refill: 0  3. Seasonal allergies  - fluticasone (FLONASE) 50 MCG/ACT nasal spray; Place 2 sprays into both nostrils daily.  Dispense: 16 g; Refill: 6 - Olopatadine HCl 0.2 % SOLN; Apply 1 drop to eye daily.  Dispense: 2.5 mL; Refill: 6 - Consider referral to allergist   Shirline Frees, NP

## 2017-06-08 ENCOUNTER — Encounter: Payer: Self-pay | Admitting: Adult Health

## 2017-06-08 ENCOUNTER — Ambulatory Visit: Payer: BC Managed Care – PPO | Admitting: Adult Health

## 2017-06-08 VITALS — BP 104/76 | Temp 98.2°F | Wt 249.0 lb

## 2017-06-08 DIAGNOSIS — Z6841 Body Mass Index (BMI) 40.0 and over, adult: Secondary | ICD-10-CM | POA: Diagnosis not present

## 2017-06-08 DIAGNOSIS — Z713 Dietary counseling and surveillance: Secondary | ICD-10-CM | POA: Diagnosis not present

## 2017-06-08 MED ORDER — PHENTERMINE HCL 15 MG PO CAPS: 15.0000 mg | ORAL_CAPSULE | ORAL | 0 refills | Status: DC

## 2017-06-08 NOTE — Progress Notes (Signed)
Subjective:    Patient ID: Lauren Powell, female    DOB: Oct 24, 1985, 32 y.o.   MRN: [id number]  HPI  32 year old female who  has a past medical history of Allergy, Eczema, and Obesity. she presents to the office today for follow up regarding obesity. She was prescribed Phentermine 15 mg in the past, the last time being at the end of October. She continues to walk on a daily basis and is eating a heart heathy diet.   She continues to notice that some of her clothes are fitting looser.   Waist Circumference is -  47.25 inchs- 06/08/2017  Her energy level os good and she has not experienced any side effects of the medication.   Wt Readings from Last 3 Encounters:  06/08/17 249 lb (112.9 kg)  03/31/17 252 lb (114.3 kg)  02/17/17 257 lb 12.8 oz (116.9 kg)    Review of Systems   See HPI   Past Medical History:  Diagnosis Date  . Allergy   . Eczema   . Obesity     Social History   Socioeconomic History  . Marital status: Single    Spouse name: Not on file  . Number of children: Not on file  . Years of education: Not on file  . Highest education level: Not on file  Social Needs  . Financial resource strain: Not on file  . Food insecurity - worry: Not on file  . Food insecurity - inability: Not on file  . Transportation needs - medical: Not on file  . Transportation needs - non-medical: Not on file  Occupational History  . Not on file  Tobacco Use  . Smoking status: Never Smoker  . Smokeless tobacco: Never Used  Substance and Sexual Activity  . Alcohol use: No  . Drug use: No  . Sexual activity: Yes    Partners: Male    Birth control/protection: None  Other Topics Concern  . Not on file  Social History Narrative   Patient lives with son   Teaches pre school and kindergarten.     Past Surgical History:  Procedure Laterality Date  . CESAREAN SECTION    . TOOTH EXTRACTION N/A     Family History  Problem Relation Age of Onset  . Alcohol abuse Mother    . Drug abuse Mother   . Bipolar disorder Mother   . Hypertension Paternal Aunt   . Hypertension Paternal Grandmother   . Prostate cancer Paternal Grandfather     No Known Allergies  Current Outpatient Medications on File Prior to Visit  Medication Sig Dispense Refill  . fluticasone (FLONASE) 50 MCG/ACT nasal spray Place 2 sprays into both nostrils daily. 16 g 6  . mometasone (ELOCON) 0.1 % cream Apply 1 application topically daily. 45 g 0  . Olopatadine HCl 0.2 % SOLN Apply 1 drop to eye daily. 2.5 mL 6  . phentermine 15 MG capsule Take 1 capsule (15 mg total) by mouth every morning. 30 capsule 0   No current facility-administered medications on file prior to visit.     BP 104/76 (BP Location: Left Arm)   Temp 98.2 F (36.8 C) (Oral)   Wt 249 lb (112.9 kg)   BMI 42.74 kg/m       Objective:   Physical Exam  Constitutional: She is oriented to person, place, and time. She appears well-developed and well-nourished. No distress.  Cardiovascular: Normal rate, regular rhythm, normal heart sounds and intact distal pulses. Exam  reveals no gallop and no friction rub.  No murmur heard. Pulmonary/Chest: Effort normal and breath sounds normal. No respiratory distress. She has no wheezes. She has no rales. She exhibits no tenderness.  Musculoskeletal: Normal range of motion.  Neurological: She is alert and oriented to person, place, and time.  Skin: Skin is warm and dry. No rash noted. She is not diaphoretic. No erythema. No pallor.  Psychiatric: She has a normal mood and affect. Her behavior is normal. Judgment and thought content normal.  Nursing note and vitals reviewed.     Assessment & Plan:  1. Weight loss counseling, encounter for - She has lost an additional 3 pounds.  - phentermine 15 MG capsule; Take 1 capsule (15 mg total) by mouth every morning.  Dispense: 30 capsule; Refill: 0 - Follow up in three months  - Increase exercise intensity   2. Class 3 severe obesity due  to excess calories without serious comorbidity with body mass index (BMI) of 45.0 to 49.9 in adult (HCC)  - phentermine 15 MG capsule; Take 1 capsule (15 mg total) by mouth every morning.  Dispense: 30 capsule; Refill: 0   Shirline Frees, NP

## 2017-08-02 ENCOUNTER — Encounter: Payer: Self-pay | Admitting: Adult Health

## 2017-08-02 ENCOUNTER — Ambulatory Visit: Payer: BC Managed Care – PPO | Admitting: Adult Health

## 2017-08-02 DIAGNOSIS — Z6841 Body Mass Index (BMI) 40.0 and over, adult: Secondary | ICD-10-CM | POA: Diagnosis not present

## 2017-08-02 DIAGNOSIS — Z713 Dietary counseling and surveillance: Secondary | ICD-10-CM | POA: Diagnosis not present

## 2017-08-02 MED ORDER — PHENTERMINE HCL 15 MG PO CAPS: 15.0000 mg | ORAL_CAPSULE | ORAL | 0 refills | Status: DC

## 2017-08-02 NOTE — Progress Notes (Signed)
Subjective:    Patient ID: Lauren Powell, female    DOB: [birth year], 32 y.o.   MRN: [id number]  In office for weight check with her Phentermine.  She has has an 8 pound weight loss since her last visit June 08, 2017.  Overall she has lost 23 pounds since July 2018.  She has made lifestyle and diet changes.  She has increased her water intake.  She is wearing a girdle to aid in feeling fuller earlier.  She does admit that there are days when she has no appetite but she does eat to make sure she is getting adequate nutrition. She reports this has helped.  She denies palpitations, chest pain or sleep disturbances.  Blood pressure has been well controlled. Would like to continue on current regimen.    Review of Systems  Constitutional: Positive for appetite change. Negative for fatigue.  Respiratory: Negative for chest tightness, shortness of breath and wheezing.   Cardiovascular: Negative for chest pain and palpitations.  Gastrointestinal: Negative for abdominal pain.    Past Medical History:  Diagnosis Date  . Allergy   . Eczema   . Obesity     Social History   Socioeconomic History  . Marital status: Single    Spouse name: Not on file  . Number of children: Not on file  . Years of education: Not on file  . Highest education level: Not on file  Social Needs  . Financial resource strain: Not on file  . Food insecurity - worry: Not on file  . Food insecurity - inability: Not on file  . Transportation needs - medical: Not on file  . Transportation needs - non-medical: Not on file  Occupational History  . Not on file  Tobacco Use  . Smoking status: Never Smoker  . Smokeless tobacco: Never Used  Substance and Sexual Activity  . Alcohol use: No  . Drug use: No  . Sexual activity: Yes    Partners: Male    Birth control/protection: None  Other Topics Concern  . Not on file  Social History Narrative   Patient lives with son   Teaches pre school and kindergarten.      Past Surgical History:  Procedure Laterality Date  . CESAREAN SECTION    . TOOTH EXTRACTION N/A     Family History  Problem Relation Age of Onset  . Alcohol abuse Mother   . Drug abuse Mother   . Bipolar disorder Mother   . Hypertension Paternal Aunt   . Hypertension Paternal Grandmother   . Prostate cancer Paternal Grandfather     No Known Allergies  Current Outpatient Medications on File Prior to Visit  Medication Sig Dispense Refill  . fluticasone (FLONASE) 50 MCG/ACT nasal spray Place 2 sprays into both nostrils daily. 16 g 6  . mometasone (ELOCON) 0.1 % cream Apply 1 application topically daily. 45 g 0  . Olopatadine HCl 0.2 % SOLN Apply 1 drop to eye daily. 2.5 mL 6  . phentermine 15 MG capsule Take 1 capsule (15 mg total) by mouth every morning. 30 capsule 0   No current facility-administered medications on file prior to visit.     BP 112/80 (BP Location: Left Arm)   Temp 97.6 F (36.4 C) (Oral)   Wt 241 lb (109.3 kg)   BMI 41.37 kg/m     Objective:   Physical Exam  Constitutional: She is oriented to person, place, and time. She appears well-developed and well-nourished. No distress.  Cardiovascular: Normal rate, regular rhythm and normal heart sounds. Exam reveals no gallop and no friction rub.  No murmur heard. Pulmonary/Chest: Effort normal and breath sounds normal. No respiratory distress. She has no wheezes. She has no rales.  Neurological: She is alert and oriented to person, place, and time.  Skin: Skin is warm and dry. She is not diaphoretic.  Nursing note and vitals reviewed.     Assessment & Plan:  1. Weight loss counseling, encounter for Continue diet and exercise.  She is doing great with weight loss and lifestyle modification. Will continue for 1 more month and take a break from Phentermine.  - phentermine 15 MG capsule; Take 1 capsule (15 mg total) by mouth every morning.  Dispense: 30 capsule; Refill: 0  2. Class 3 severe obesity due to  excess calories without serious comorbidity with body mass index (BMI) of 45.0 to 49.9 in adult [facility](HCC) Continue Phentermine for 1 more month and take hiatus.  - phentermine 15 MG capsule; Take 1 capsule (15 mg total) by mouth every morning.  Dispense: 30 capsule; Refill: 0  Kortez Murtagh C Stormee Duda BSN RN NP student

## 2017-08-02 NOTE — Progress Notes (Signed)
Subjective:    Patient ID: Lauren Powell, female    DOB: [birth year], 32 y.o.   MRN: [id number]  HPI  32 year old female who  has a past medical history of Allergy, Eczema, and Obesity. She presents to the office today for one month follow up regarding obesity. She was restarted on Phentermine 15 mg since July 2018 . She was walking on a daily basis and was eating a heart healthy diet.   Today in the office she reports that she is doing very well. She denies any side effects such as insomnia, palpitations or anorexia.   Wt Readings from Last 3 Encounters:  08/02/17 241 lb (109.3 kg)  06/08/17 249 lb (112.9 kg)  03/31/17 252 lb (114.3 kg)   Waist Circumference 47.25 in - 06/08/2017  42.75 in   - 08/02/2017   Review of Systems   See HPI    Past Medical History:  Diagnosis Date  . Allergy   . Eczema   . Obesity     Social History   Socioeconomic History  . Marital status: Single    Spouse name: Not on file  . Number of children: Not on file  . Years of education: Not on file  . Highest education level: Not on file  Social Needs  . Financial resource strain: Not on file  . Food insecurity - worry: Not on file  . Food insecurity - inability: Not on file  . Transportation needs - medical: Not on file  . Transportation needs - non-medical: Not on file  Occupational History  . Not on file  Tobacco Use  . Smoking status: Never Smoker  . Smokeless tobacco: Never Used  Substance and Sexual Activity  . Alcohol use: No  . Drug use: No  . Sexual activity: Yes    Partners: Male    Birth control/protection: None  Other Topics Concern  . Not on file  Social History Narrative   Patient lives with son   Teaches pre school and kindergarten.     Past Surgical History:  Procedure Laterality Date  . CESAREAN SECTION    . TOOTH EXTRACTION N/A     Family History  Problem Relation Age of Onset  . Alcohol abuse Mother   . Drug abuse Mother   . Bipolar disorder  Mother   . Hypertension Paternal Aunt   . Hypertension Paternal Grandmother   . Prostate cancer Paternal Grandfather     No Known Allergies  Current Outpatient Medications on File Prior to Visit  Medication Sig Dispense Refill  . fluticasone (FLONASE) 50 MCG/ACT nasal spray Place 2 sprays into both nostrils daily. 16 g 6  . mometasone (ELOCON) 0.1 % cream Apply 1 application topically daily. 45 g 0  . Olopatadine HCl 0.2 % SOLN Apply 1 drop to eye daily. 2.5 mL 6  . phentermine 15 MG capsule Take 1 capsule (15 mg total) by mouth every morning. 30 capsule 0   No current facility-administered medications on file prior to visit.     BP 112/80 (BP Location: Left Arm)   Temp 97.6 F (36.4 C) (Oral)   Wt 241 lb (109.3 kg)   BMI 41.37 kg/m       Objective:   Physical Exam  Constitutional: She is oriented to person, place, and time. She appears well-developed and well-nourished. No distress.  Cardiovascular: Normal rate, regular rhythm, normal heart sounds and intact distal pulses. Exam reveals no gallop and no friction rub.  No  murmur heard. Pulmonary/Chest: Effort normal and breath sounds normal. No respiratory distress. She has no wheezes. She has no rales. She exhibits no tenderness.  Abdominal: Soft. Bowel sounds are normal. She exhibits no distension and no mass. There is no tenderness. There is no rebound and no guarding.  Neurological: She is alert and oriented to person, place, and time.  Skin: Skin is warm and dry. No rash noted. No erythema. No pallor.  Psychiatric: She has a normal mood and affect. Her behavior is normal. Judgment and thought content normal.  Nursing note and vitals reviewed.     Assessment & Plan:  - Will do one more month on phentermine and then take a break  - Follow up in one month   Shirline Frees, NP

## 2017-08-11 ENCOUNTER — Ambulatory Visit: Payer: Self-pay | Admitting: *Deleted

## 2017-08-11 NOTE — Telephone Encounter (Signed)
Patient is calling to report that she had to leave work due to diarrhea that started yesterday and she did have a couple episodes of vomiting. She states she has just gotten some antidiarrhea medication and Pepto and she wants to try that before she comes into the office. She is going to push fluids and start on bland diet. She will call back if she does not improve and feels she needs to be seen.  Reason for Disposition . [1] SEVERE diarrhea (e.g., 7 or more times / day more than normal) AND [2] present > 24 hours (1 day)  Answer Assessment - Initial Assessment Questions 1. DIARRHEA SEVERITY: "How bad is the diarrhea?" "How many extra stools have you had in the past 24 hours than normal?"    - MILD: Few loose or mushy BMs; increase of 1-3 stools over normal daily number of stools; mild increase in ostomy output.   - MODERATE: Increase of 4-6 stools daily over normal; moderate increase in ostomy output.   - SEVERE (or Worst Possible): Increase of 7 or more stools daily over normal; moderate increase in ostomy output; incontinence.     Severe- watery 2. ONSET: "When did the diarrhea begin?"      Yesterday morning 3. BM CONSISTENCY: "How loose or watery is the diarrhea?"      watery 4. VOMITING: "Are you also vomiting?" If so, ask: "How many times in the past 24 hours?"      Yes- 2:30am and 7:15am  - patient is able to keep saltines and ginger ale down 5. ABDOMINAL PAIN: "Are you having any abdominal pain?" If yes: "What does it feel like?" (e.g., crampy, dull, intermittent, constant)      Yes- comes/goes- feels like gas  6. ABDOMINAL PAIN SEVERITY: If present, ask: "How bad is the pain?"  (e.g., Scale 1-10; mild, moderate, or severe)    - MILD (1-3): doesn't interfere with normal activities, abdomen soft and not tender to touch     - MODERATE (4-7): interferes with normal activities or awakens from sleep, tender to touch     - SEVERE (8-10): excruciating pain, doubled over, unable to do any  normal activities       7 7. ORAL INTAKE: If vomiting, "Have you been able to drink liquids?" "How much fluids have you had in the past 24 hours?"     Crackers and ginger ale 8. HYDRATION: "Any signs of dehydration?" (e.g., dry mouth [not just dry lips], too weak to stand, dizziness, new weight loss) "When did you last urinate?"     No- 1 hour ago 9. EXPOSURE: "Have you traveled to a foreign country recently?" "Have you been exposed to anyone with diarrhea?" "Could you have eaten any food that was spoiled?"     Patient is teacher 10. OTHER SYMPTOMS: "Do you have any other symptoms?" (e.g., fever, blood in stool)       no 11. PREGNANCY: "Is there any chance you are pregnant?" "When was your last menstrual period?"       No- LMP-07/21/17  Protocols used: [facility]

## 2017-12-06 ENCOUNTER — Encounter: Payer: Self-pay | Admitting: Adult Health

## 2017-12-15 ENCOUNTER — Ambulatory Visit (INDEPENDENT_AMBULATORY_CARE_PROVIDER_SITE_OTHER): Payer: BC Managed Care – PPO | Admitting: Adult Health

## 2017-12-15 ENCOUNTER — Encounter: Payer: Self-pay | Admitting: Adult Health

## 2017-12-15 VITALS — BP 110/88 | Temp 97.9°F | Ht 64.0 in | Wt 245.0 lb

## 2017-12-15 DIAGNOSIS — Z6841 Body Mass Index (BMI) 40.0 and over, adult: Secondary | ICD-10-CM | POA: Diagnosis not present

## 2017-12-15 DIAGNOSIS — R5383 Other fatigue: Secondary | ICD-10-CM | POA: Diagnosis not present

## 2017-12-15 DIAGNOSIS — Z0000 Encounter for general adult medical examination without abnormal findings: Secondary | ICD-10-CM

## 2017-12-15 DIAGNOSIS — Z114 Encounter for screening for human immunodeficiency virus [HIV]: Secondary | ICD-10-CM

## 2017-12-15 DIAGNOSIS — Z713 Dietary counseling and surveillance: Secondary | ICD-10-CM

## 2017-12-15 LAB — CBC WITH DIFFERENTIAL/PLATELET
BASOS PCT: 0.7 % (ref 0.0–3.0)
Basophils Absolute: 0.1 10*3/uL (ref 0.0–0.1)
Eosinophils Absolute: 0.1 10*3/uL (ref 0.0–0.7)
Eosinophils Relative: 0.9 % (ref 0.0–5.0)
HEMATOCRIT: 36.9 % (ref 36.0–46.0)
Hemoglobin: 11.9 g/dL — ABNORMAL LOW (ref 12.0–15.0)
LYMPHS PCT: 24.9 % (ref 12.0–46.0)
Lymphs Abs: 1.9 10*3/uL (ref 0.7–4.0)
MCHC: 32.4 g/dL (ref 30.0–36.0)
MCV: 80.9 fl (ref 78.0–100.0)
MONOS PCT: 5.1 % (ref 3.0–12.0)
Monocytes Absolute: 0.4 10*3/uL (ref 0.1–1.0)
NEUTROS ABS: 5.3 10*3/uL (ref 1.4–7.7)
Neutrophils Relative %: 68.4 % (ref 43.0–77.0)
PLATELETS: 305 10*3/uL (ref 150.0–400.0)
RBC: 4.56 Mil/uL (ref 3.87–5.11)
RDW: 15 % (ref 11.5–15.5)
WBC: 7.8 10*3/uL (ref 4.0–10.5)

## 2017-12-15 LAB — BASIC METABOLIC PANEL
BUN: 9 mg/dL (ref 6–23)
CO2: 31 meq/L (ref 19–32)
CREATININE: 0.82 mg/dL (ref 0.40–1.20)
Calcium: 9.3 mg/dL (ref 8.4–10.5)
Chloride: 102 mEq/L (ref 96–112)
GFR: 103.15 mL/min (ref >60.00)
GLUCOSE: 104 mg/dL — AB (ref 70–99)
Potassium: 3.9 mEq/L (ref 3.5–5.1)
SODIUM: 140 meq/L (ref 135–145)

## 2017-12-15 LAB — LIPID PANEL
CHOLESTEROL: 144 mg/dL (ref 0–200)
HDL: 60.6 mg/dL (ref >39.00)
LDL Cholesterol: 73 mg/dL (ref 0–99)
NonHDL: 83.04
TRIGLYCERIDES: 49 mg/dL (ref 0.0–149.0)
Total CHOL/HDL Ratio: 2
VLDL: 9.8 mg/dL (ref 0.0–40.0)

## 2017-12-15 LAB — HEPATIC FUNCTION PANEL
ALT: 10 U/L (ref 0–35)
AST: 11 U/L (ref 0–37)
Albumin: 4.2 g/dL (ref 3.5–5.2)
Alkaline Phosphatase: 64 U/L (ref 39–117)
Bilirubin, Direct: 0.2 mg/dL (ref 0.0–0.3)
TOTAL PROTEIN: 7.2 g/dL (ref 6.0–8.3)
Total Bilirubin: 0.7 mg/dL (ref 0.2–1.2)

## 2017-12-15 LAB — HEMOGLOBIN A1C: Hgb A1c MFr Bld: 5.8 % (ref 4.6–6.5)

## 2017-12-15 LAB — VITAMIN B12: VITAMIN B 12: 398 pg/mL (ref 211–911)

## 2017-12-15 LAB — VITAMIN D 25 HYDROXY (VIT D DEFICIENCY, FRACTURES): VITD: 22.35 ng/mL — AB (ref 30.00–100.00)

## 2017-12-15 LAB — TSH: TSH: 1.16 u[IU]/mL (ref 0.35–4.50)

## 2017-12-15 MED ORDER — PHENTERMINE HCL 15 MG PO CAPS: 15.0000 mg | ORAL_CAPSULE | ORAL | 0 refills | Status: DC

## 2017-12-15 NOTE — Progress Notes (Signed)
Subjective:    Patient ID: Lauren Powell, female    DOB: 1986-04-20, 32 y.o.   MRN: [id number]  HPI Patient presents for yearly preventative medicine examination. She is a pleasant 32 year old female who  has a past medical history of Allergy, Eczema, and Obesity.  Seasonal Allergies - Controlled with Flonase   Eczema - uses steroid cream PRN   Obesity - she has been working on diet and exercise. She is wondering if she can use Phentermine for another few months. She last took this in February and responded well to the medication.  Wt Readings from Last 3 Encounters:  12/15/17 245 lb (111.1 kg)  08/02/17 241 lb (109.3 kg)  06/08/17 249 lb (112.9 kg)   All immunizations and health maintenance protocols were reviewed with the patient and needed orders were placed.  Appropriate screening laboratory values were ordered for the patient including screening of hyperlipidemia, renal function and hepatic function.  Medication reconciliation,  past medical history, social history, problem list and allergies were reviewed in detail with the patient  Goals were established with regard to weight loss, exercise, and  diet in compliance with medications. She is working on weight loss through diet and exercise. She is going to get a Systems analyst this week. She has noticed that her clothes are fitting looser and that she can gone from a size 20 pant to size 16.   She is up-to-date on routine GYN exams.  She participates in routine dental and vision screens. She has been seen by her eye doctor but has not gone to the dentist yet  Her only acute complaint is that of fatigue over the last two or three weeks. She reports that she is sleeping well and does not feel fatigued when she wakes up in the morning. Her fatigue starts in the early afternoon. She will have to often have to take naps in the middle of the afternoon. Denies waking up in the night gasping for breath.   Review of Systems    Constitutional: Positive for fatigue.  HENT: Negative.   Eyes: Negative.   Respiratory: Negative.   Cardiovascular: Negative.   Gastrointestinal: Negative.   Endocrine: Negative.   Genitourinary: Negative.   Musculoskeletal: Negative.   Skin: Negative.   Allergic/Immunologic: Negative.   Neurological: Negative.   Hematological: Negative.   Psychiatric/Behavioral: Negative.    Past Medical History:  Diagnosis Date  . Allergy   . Eczema   . Obesity     Social History   Socioeconomic History  . Marital status: Single    Spouse name: Not on file  . Number of children: Not on file  . Years of education: Not on file  . Highest education level: Not on file  Occupational History  . Not on file  Social Needs  . Financial resource strain: Not on file  . Food insecurity:    Worry: Not on file    Inability: Not on file  . Transportation needs:    Medical: Not on file    Non-medical: Not on file  Tobacco Use  . Smoking status: Never Smoker  . Smokeless tobacco: Never Used  Substance and Sexual Activity  . Alcohol use: No  . Drug use: No  . Sexual activity: Yes    Partners: Male    Birth control/protection: None  Lifestyle  . Physical activity:    Days per week: Not on file    Minutes per session: Not on file  .  Stress: Not on file  Relationships  . Social connections:    Talks on phone: Not on file    Gets together: Not on file    Attends religious service: Not on file    Active member of club or organization: Not on file    Attends meetings of clubs or organizations: Not on file    Relationship status: Not on file  . Intimate partner violence:    Fear of current or ex partner: Not on file    Emotionally abused: Not on file    Physically abused: Not on file    Forced sexual activity: Not on file  Other Topics Concern  . Not on file  Social History Narrative   Patient lives with son   Teaches pre school and kindergarten.     Past Surgical History:   Procedure Laterality Date  . CESAREAN SECTION    . TOOTH EXTRACTION N/A     Family History  Problem Relation Age of Onset  . Alcohol abuse Mother   . Drug abuse Mother   . Bipolar disorder Mother   . Hypertension Paternal Aunt   . Hypertension Paternal Grandmother   . Prostate cancer Paternal Grandfather     No Known Allergies  Current Outpatient Medications on File Prior to Visit  Medication Sig Dispense Refill  . fluticasone (FLONASE) 50 MCG/ACT nasal spray Place 2 sprays into both nostrils daily. 16 g 6  . mometasone (ELOCON) 0.1 % cream Apply 1 application topically daily. 45 g 0  . Olopatadine HCl 0.2 % SOLN Apply 1 drop to eye daily. 2.5 mL 6  . phentermine 15 MG capsule Take 1 capsule (15 mg total) by mouth every morning. (Patient not taking: Reported on 12/15/2017) 30 capsule 0   No current facility-administered medications on file prior to visit.     BP 110/88   Temp 97.9 F (36.6 C) (Oral)   Ht 5\' 4"  (1.626 m)   Wt 245 lb (111.1 kg)   BMI 42.05 kg/m       Objective:   Physical Exam  Constitutional: She is oriented to person, place, and time. She appears well-developed and well-nourished. No distress.  Obese   HENT:  Head: Normocephalic and atraumatic.  Right Ear: External ear normal.  Left Ear: External ear normal.  Nose: Nose normal.  Mouth/Throat: Oropharynx is clear and moist. No oropharyngeal exudate.  Eyes: Pupils are equal, round, and reactive to light. Conjunctivae and EOM are normal. Right eye exhibits no discharge. Left eye exhibits no discharge. No scleral icterus.  Neck: Normal range of motion. Neck supple. No JVD present. No tracheal deviation present. No thyromegaly present.  Cardiovascular: Normal rate, regular rhythm, normal heart sounds and intact distal pulses. Exam reveals no gallop and no friction rub.  No murmur heard. Pulmonary/Chest: Effort normal and breath sounds normal. No stridor. No respiratory distress. She has no wheezes. She  has no rales. She exhibits no tenderness.  Abdominal: Soft. Bowel sounds are normal. She exhibits no distension and no mass. There is no tenderness. There is no rebound and no guarding. No hernia.  Genitourinary:  Genitourinary Comments: Done by GYN   Musculoskeletal: Normal range of motion. She exhibits no edema, tenderness or deformity.  Lymphadenopathy:    She has no cervical adenopathy.  Neurological: She is alert and oriented to person, place, and time. She displays normal reflexes. No cranial nerve deficit or sensory deficit. She exhibits normal muscle tone. Coordination normal.  Skin: Skin is warm  and dry. Capillary refill takes less than 2 seconds. No rash noted. She is not diaphoretic. No erythema. No pallor.  Psychiatric: She has a normal mood and affect. Her behavior is normal. Judgment and thought content normal.  Nursing note and vitals reviewed.     Assessment & Plan:  1. Routine general medical examination at a health care facility - Follow up in one year or sooner if neede - Basic metabolic panel - CBC with Differential/Platelet - Hemoglobin A1c - Hepatic function panel - Lipid panel - TSH - Vitamin B12 - Iron, TIBC and Ferritin Panel  2. Class 3 severe obesity due to excess calories without serious comorbidity with body mass index (BMI) of 45.0 to 49.9 in adult [facility](HCC) - Continue to work on diet and exercise.  - Basic metabolic panel - CBC with Differential/Platelet - Hemoglobin A1c - Hepatic function panel - Lipid panel - TSH - Vitamin B12 - Iron, TIBC and Ferritin Panel - phentermine 15 MG capsule; Take 1 capsule (15 mg total) by mouth every morning.  Dispense: 30 capsule; Refill: 0   4. Encounter for screening for HIV  - HIV antibody  5. Fatigue, unspecified type  - Basic metabolic panel - CBC with Differential/Platelet - Vitamin B12 - Iron, TIBC and Ferritin Panel - Vitamin D, 25-hydroxy  6. Weight loss counseling, encounter for - I am ok with  restarting on phentermine for a short course.  - Follow up in one month  - phentermine 15 MG capsule; Take 1 capsule (15 mg total) by mouth every morning.  Dispense: 30 capsule; Refill: 0  Shirline Freesory Tristen Pennino, NP

## 2017-12-16 LAB — IRON,TIBC AND FERRITIN PANEL
%SAT: 9 % — AB (ref 16–45)
Ferritin: 16 ng/mL (ref 16–154)
Iron: 30 ug/dL — ABNORMAL LOW (ref 40–190)
TIBC: 344 mcg/dL (calc) (ref 250–450)

## 2017-12-16 LAB — HIV ANTIBODY (ROUTINE TESTING W REFLEX): HIV: NONREACTIVE

## 2017-12-21 ENCOUNTER — Ambulatory Visit: Payer: BC Managed Care – PPO | Admitting: Obstetrics

## 2017-12-21 ENCOUNTER — Encounter: Payer: Self-pay | Admitting: Obstetrics

## 2017-12-21 VITALS — BP 111/76 | HR 82 | Wt 246.0 lb

## 2017-12-21 DIAGNOSIS — Z124 Encounter for screening for malignant neoplasm of cervix: Secondary | ICD-10-CM | POA: Diagnosis not present

## 2017-12-21 DIAGNOSIS — Z3202 Encounter for pregnancy test, result negative: Secondary | ICD-10-CM

## 2017-12-21 DIAGNOSIS — Z1151 Encounter for screening for human papillomavirus (HPV): Secondary | ICD-10-CM | POA: Diagnosis not present

## 2017-12-21 DIAGNOSIS — N898 Other specified noninflammatory disorders of vagina: Secondary | ICD-10-CM

## 2017-12-21 DIAGNOSIS — Z01419 Encounter for gynecological examination (general) (routine) without abnormal findings: Secondary | ICD-10-CM | POA: Diagnosis not present

## 2017-12-21 DIAGNOSIS — Z3169 Encounter for other general counseling and advice on procreation: Secondary | ICD-10-CM

## 2017-12-21 DIAGNOSIS — N839 Noninflammatory disorder of ovary, fallopian tube and broad ligament, unspecified: Secondary | ICD-10-CM

## 2017-12-21 DIAGNOSIS — Z6841 Body Mass Index (BMI) 40.0 and over, adult: Secondary | ICD-10-CM

## 2017-12-21 LAB — POCT URINE PREGNANCY: Preg Test, Ur: NEGATIVE

## 2017-12-21 MED ORDER — PRENATAL PLUS 27-1 MG PO TABS: 1.0000 | ORAL_TABLET | Freq: Every day | ORAL | 11 refills | Status: DC

## 2017-12-21 NOTE — Progress Notes (Signed)
RGYN patient here to discuss fertility.   LMP: June 27th   Patient has been trying to conceive for over a yr now.  Last pap 07/30/2016 WNL   UPT: NEGATIVE

## 2017-12-21 NOTE — Progress Notes (Signed)
Subjective:        Lauren Powell is a 32 y.o. female here for a routine exam.  Current complaints: Trying to get pregnant for over a year without success.    Personal health questionnaire:  Is patient Ashkenazi Jewish, have a family history of breast and/or ovarian cancer: no Is there a family history of uterine cancer diagnosed at age < 43, gastrointestinal cancer, urinary tract cancer, family member who is a Personnel officer syndrome-associated carrier: no Is the patient overweight and hypertensive, family history of diabetes, personal history of gestational diabetes, preeclampsia or PCOS: no Is patient over 51, have PCOS,  family history of premature CHD under age 34, diabetes, smoke, have hypertension or peripheral artery disease:  no At any time, has a partner hit, kicked or otherwise hurt or frightened you?: no Over the past 2 weeks, have you felt down, depressed or hopeless?: no Over the past 2 weeks, have you felt little interest or pleasure in doing things?:no   Gynecologic History Patient's last menstrual period was 12/01/2017 (exact date). Contraception: none Last Pap: 2018. Results were: normal Last mammogram: n/a. Results were: n/a  Obstetric History OB History  Gravida Para Term Preterm AB Living  1 1 1     1   SAB TAB Ectopic Multiple Live Births               # Outcome Date GA Lbr Len/2nd Weight Sex Delivery Anes PTL Lv  1 Term             Past Medical History:  Diagnosis Date  . Allergy   . Eczema   . History of anemia   . Obesity     Past Surgical History:  Procedure Laterality Date  . CESAREAN SECTION    . TOOTH EXTRACTION N/A      Current Outpatient Medications:  .  fluticasone (FLONASE) 50 MCG/ACT nasal spray, Place 2 sprays into both nostrils daily. (Patient not taking: Reported on 12/21/2017), Disp: 16 g, Rfl: 6 .  mometasone (ELOCON) 0.1 % cream, Apply 1 application topically daily. (Patient not taking: Reported on 12/21/2017), Disp: 45 g, Rfl:  0 .  Olopatadine HCl 0.2 % SOLN, Apply 1 drop to eye daily. (Patient not taking: Reported on 12/21/2017), Disp: 2.5 mL, Rfl: 6 .  phentermine 15 MG capsule, Take 1 capsule (15 mg total) by mouth every morning. (Patient not taking: Reported on 12/21/2017), Disp: 30 capsule, Rfl: 0 .  prenatal vitamin w/FE, FA (PRENATAL 1 + 1) 27-1 MG TABS tablet, Take 1 tablet by mouth daily before breakfast., Disp: 30 each, Rfl: 11 No Known Allergies  Social History   Tobacco Use  . Smoking status: Never Smoker  . Smokeless tobacco: Never Used  Substance Use Topics  . Alcohol use: No    Family History  Problem Relation Age of Onset  . Alcohol abuse Mother   . Drug abuse Mother   . Bipolar disorder Mother   . Hypertension Paternal Aunt   . Hypertension Paternal Grandmother   . Prostate cancer Paternal Grandfather       Review of Systems  Constitutional: negative for fatigue and weight loss Respiratory: negative for cough and wheezing Cardiovascular: negative for chest pain, fatigue and palpitations Gastrointestinal: negative for abdominal pain and change in bowel habits Musculoskeletal:negative for myalgias Neurological: negative for gait problems and tremors Behavioral/Psych: negative for abusive relationship, depression Endocrine: negative for temperature intolerance    Genitourinary:negative for abnormal menstrual periods, genital lesions, hot flashes, sexual  problems and vaginal discharge Integument/breast: negative for breast lump, breast tenderness, nipple discharge and skin lesion(s)    Objective:       BP 111/76   Pulse 82   Wt 246 lb (111.6 kg)   LMP 12/01/2017 (Exact Date)   BMI 42.23 kg/m  General:   alert  Skin:   no rash or abnormalities  Lungs:   clear to auscultation bilaterally  Heart:   regular rate and rhythm, S1, S2 normal, no murmur, click, rub or gallop  Breasts:   normal without suspicious masses, skin or nipple changes or axillary nodes  Abdomen:  normal  findings: no organomegaly, soft, non-tender and no hernia  Pelvis:  External genitalia: normal general appearance Urinary system: urethral meatus normal and bladder without fullness, nontender Vaginal: normal without tenderness, induration or masses Cervix: normal appearance Adnexa: normal bimanual exam Uterus: anteverted and non-tender, normal size   Lab Review Urine pregnancy test Labs reviewed yes Radiologic studies reviewed no  50% of 20 min visit spent on counseling and coordination of care.   Assessment:     1. Encounter for routine gynecological examination with Papanicolaou smear of cervix Rx: - Cytology - PAP  2. Vaginal discharge Rx: - Cervicovaginal ancillary only  3. Class 3 severe obesity due to excess calories without serious comorbidity with body mass index (BMI) of 40.0 to 44.9 in adult [facility](HCC) - program of caloric reduction, exercise and behavioral modification recommended  4. Disorder of ovulation Rx: - POCT urine pregnancy - Ambulatory referral to Infertility  5. Encounter for preconception consultation Rx: - prenatal vitamin w/FE, FA (PRENATAL 1 + 1) 27-1 MG TABS tablet; Take 1 tablet by mouth daily before breakfast.  Dispense: 30 each; Refill: 11    Plan:    Education reviewed: calcium supplements, depression evaluation, low fat, low cholesterol diet, safe sex/STD prevention, self breast exams and weight bearing exercise. Follow up in: 4 weeks.   Meds ordered this encounter  Medications  . prenatal vitamin w/FE, FA (PRENATAL 1 + 1) 27-1 MG TABS tablet    Sig: Take 1 tablet by mouth daily before breakfast.    Dispense:  30 each    Refill:  11   Orders Placed This Encounter  Procedures  . Ambulatory referral to Infertility    Referral Priority:   Routine    Referral Type:   Consultation    Referral Reason:   Specialty Services Required    Requested Specialty:   Obstetrics    Number of Visits Requested:   1  . POCT urine pregnancy     Brock BadHARLES A. HARPER MD 12-21-2017

## 2017-12-22 LAB — CERVICOVAGINAL ANCILLARY ONLY
Bacterial vaginitis: NEGATIVE
CHLAMYDIA, DNA PROBE: NEGATIVE
Candida vaginitis: NEGATIVE
NEISSERIA GONORRHEA: NEGATIVE
Trichomonas: NEGATIVE

## 2017-12-22 LAB — CYTOLOGY - PAP
Diagnosis: NEGATIVE
HPV: NOT DETECTED

## 2018-01-05 ENCOUNTER — Ambulatory Visit: Payer: BC Managed Care – PPO | Admitting: Adult Health

## 2018-02-14 ENCOUNTER — Ambulatory Visit: Payer: BC Managed Care – PPO | Admitting: Adult Health

## 2018-02-14 ENCOUNTER — Encounter: Payer: Self-pay | Admitting: Adult Health

## 2018-02-14 VITALS — BP 120/80 | Temp 98.3°F | Wt 248.0 lb

## 2018-02-14 DIAGNOSIS — Z23 Encounter for immunization: Secondary | ICD-10-CM | POA: Diagnosis not present

## 2018-02-14 DIAGNOSIS — Z6841 Body Mass Index (BMI) 40.0 and over, adult: Secondary | ICD-10-CM

## 2018-02-14 MED ORDER — PHENTERMINE HCL 30 MG PO CAPS: 30.0000 mg | ORAL_CAPSULE | ORAL | 0 refills | Status: DC

## 2018-02-14 NOTE — Progress Notes (Signed)
Subjective:    Patient ID: Lauren Powell, female    DOB: [birth year], 32 y.o.   MRN: [id number]  HPI 32 year old female who  has a past medical history of Allergy, Eczema, History of anemia, and Obesity.  She presents to the office today for follow up regarding weight loss. In July 2019 she was restarted on Phentermine. She has been working with a Systems analyst three days a week. She continues to be in a 16 size pants - down from 20. She is also in a Large shirt - down from a 2 XL   Wt Readings from Last 3 Encounters:  02/14/18 248 lb (112.5 kg)  12/21/17 246 lb (111.6 kg)  12/15/17 245 lb (111.1 kg)   She denies any side effects of phentermine   Review of Systems See HPI   Past Medical History:  Diagnosis Date  . Allergy   . Eczema   . History of anemia   . Obesity     Social History   Socioeconomic History  . Marital status: Single    Spouse name: Not on file  . Number of children: Not on file  . Years of education: Not on file  . Highest education level: Not on file  Occupational History  . Not on file  Social Needs  . Financial resource strain: Not on file  . Food insecurity:    Worry: Not on file    Inability: Not on file  . Transportation needs:    Medical: Not on file    Non-medical: Not on file  Tobacco Use  . Smoking status: Never Smoker  . Smokeless tobacco: Never Used  Substance and Sexual Activity  . Alcohol use: No  . Drug use: No  . Sexual activity: Yes    Partners: Male    Birth control/protection: None  Lifestyle  . Physical activity:    Days per week: Not on file    Minutes per session: Not on file  . Stress: Not on file  Relationships  . Social connections:    Talks on phone: Not on file    Gets together: Not on file    Attends religious service: Not on file    Active member of club or organization: Not on file    Attends meetings of clubs or organizations: Not on file    Relationship status: Not on file  . Intimate  partner violence:    Fear of current or ex partner: Not on file    Emotionally abused: Not on file    Physically abused: Not on file    Forced sexual activity: Not on file  Other Topics Concern  . Not on file  Social History Narrative   Patient lives with son   Teaches pre school and kindergarten.     Past Surgical History:  Procedure Laterality Date  . CESAREAN SECTION    . TOOTH EXTRACTION N/A     Family History  Problem Relation Age of Onset  . Alcohol abuse Mother   . Drug abuse Mother   . Bipolar disorder Mother   . Hypertension Paternal Aunt   . Hypertension Paternal Grandmother   . Prostate cancer Paternal Grandfather     No Known Allergies  Current Outpatient Medications on File Prior to Visit  Medication Sig Dispense Refill  . fluticasone (FLONASE) 50 MCG/ACT nasal spray Place 2 sprays into both nostrils daily. 16 g 6  . mometasone (ELOCON) 0.1 % cream Apply 1 application topically  daily. 45 g 0  . Olopatadine HCl 0.2 % SOLN Apply 1 drop to eye daily. 2.5 mL 6  . phentermine 15 MG capsule Take 1 capsule (15 mg total) by mouth every morning. 30 capsule 0  . prenatal vitamin w/FE, FA (PRENATAL 1 + 1) 27-1 MG TABS tablet Take 1 tablet by mouth daily before breakfast. 30 each 11   No current facility-administered medications on file prior to visit.     BP 120/80   Temp 98.3 F (36.8 C) (Oral)   Wt 248 lb (112.5 kg)   BMI 42.57 kg/m       Objective:   Physical Exam  Constitutional: She is oriented to person, place, and time. She appears well-developed and well-nourished. No distress.  Cardiovascular: Normal rate, regular rhythm, normal heart sounds and intact distal pulses.  Pulmonary/Chest: Effort normal and breath sounds normal.  Neurological: She is alert and oriented to person, place, and time.  Skin: Skin is warm and dry. She is not diaphoretic.  Psychiatric: She has a normal mood and affect. Her behavior is normal. Judgment and thought content  normal.  Nursing note and vitals reviewed.     Assessment & Plan:  1. Class 3 severe obesity due to excess calories without serious comorbidity with body mass index (BMI) of 45.0 to 49.9 in adult (HCC) - weight up but possible muscle mass. Will increase Phentermine to 30 mg daily.  - Follow up in 1 month - phentermine 30 MG capsule; Take 1 capsule (30 mg total) by mouth every morning.  Dispense: 30 capsule; Refill: 0  2. Need for prophylactic vaccination and inoculation against influenza  - Flu Vaccine QUAD 6+ mos PF IM (Fluarix Quad PF)  Shirline Frees, NP

## 2018-02-20 ENCOUNTER — Telehealth: Payer: Self-pay | Admitting: *Deleted

## 2018-02-20 NOTE — Telephone Encounter (Signed)
Prior auth for Phentermine 30mg  capsules sent to [email or web address] [id number].

## 2018-02-24 ENCOUNTER — Other Ambulatory Visit: Payer: Self-pay | Admitting: Adult Health

## 2018-02-24 DIAGNOSIS — Z6841 Body Mass Index (BMI) 40.0 and over, adult: Principal | ICD-10-CM

## 2018-02-24 NOTE — Telephone Encounter (Signed)
Fax received from [facility] stating the request was approved.  I called [facility] and informed Shiny of this and she stated the pt usually pays out of pocket for this.

## 2018-03-30 ENCOUNTER — Ambulatory Visit: Payer: BC Managed Care – PPO | Admitting: Adult Health

## 2018-03-30 ENCOUNTER — Encounter: Payer: Self-pay | Admitting: Adult Health

## 2018-03-30 DIAGNOSIS — J302 Other seasonal allergic rhinitis: Secondary | ICD-10-CM | POA: Diagnosis not present

## 2018-03-30 DIAGNOSIS — Z6841 Body Mass Index (BMI) 40.0 and over, adult: Secondary | ICD-10-CM

## 2018-03-30 MED ORDER — PHENTERMINE HCL 30 MG PO CAPS: 30.0000 mg | ORAL_CAPSULE | ORAL | 0 refills | Status: DC

## 2018-03-30 MED ORDER — OLOPATADINE HCL 0.2 % OP SOLN: 1.0000 [drp] | Freq: Every day | OPHTHALMIC | 6 refills | Status: DC

## 2018-03-30 NOTE — Progress Notes (Signed)
Subjective:    Patient ID: Lauren Powell, female    DOB: 1985-09-15, 32 y.o.   MRN: [id number]  HPI 32 year old female who  has a past medical history of Allergy, Eczema, History of anemia, and Obesity.  She presents to the office today for follow up regarding weight loss management. She is completing her second month of phentermine. She continues to work with Systems analyst three days a week and is doing weight training. She is eating lean meats, fruits, and vegetables. She is not eating out.   She feels as though her pants and shirts are fitting looser over the last month. Continues to be in a size 16 pant  She denies any side effects of phentermine. She feels as though her energy level is good and appetite has been consistent.    Wt Readings from Last 3 Encounters:  03/30/18 246 lb (111.6 kg)  02/14/18 248 lb (112.5 kg)  12/21/17 246 lb (111.6 kg)    Review of Systems See HPI   Past Medical History:  Diagnosis Date  . Allergy   . Eczema   . History of anemia   . Obesity     Social History   Socioeconomic History  . Marital status: Single    Spouse name: Not on file  . Number of children: Not on file  . Years of education: Not on file  . Highest education level: Not on file  Occupational History  . Not on file  Social Needs  . Financial resource strain: Not on file  . Food insecurity:    Worry: Not on file    Inability: Not on file  . Transportation needs:    Medical: Not on file    Non-medical: Not on file  Tobacco Use  . Smoking status: Never Smoker  . Smokeless tobacco: Never Used  Substance and Sexual Activity  . Alcohol use: No  . Drug use: No  . Sexual activity: Yes    Partners: Male    Birth control/protection: None  Lifestyle  . Physical activity:    Days per week: Not on file    Minutes per session: Not on file  . Stress: Not on file  Relationships  . Social connections:    Talks on phone: Not on file    Gets together: Not on  file    Attends religious service: Not on file    Active member of club or organization: Not on file    Attends meetings of clubs or organizations: Not on file    Relationship status: Not on file  . Intimate partner violence:    Fear of current or ex partner: Not on file    Emotionally abused: Not on file    Physically abused: Not on file    Forced sexual activity: Not on file  Other Topics Concern  . Not on file  Social History Narrative   Patient lives with son   Teaches pre school and kindergarten.     Past Surgical History:  Procedure Laterality Date  . CESAREAN SECTION    . TOOTH EXTRACTION N/A     Family History  Problem Relation Age of Onset  . Alcohol abuse Mother   . Drug abuse Mother   . Bipolar disorder Mother   . Hypertension Paternal Aunt   . Hypertension Paternal Grandmother   . Prostate cancer Paternal Grandfather     No Known Allergies  Current Outpatient Medications on File Prior to Visit  Medication  Sig Dispense Refill  . fluticasone (FLONASE) 50 MCG/ACT nasal spray Place 2 sprays into both nostrils daily. 16 g 6  . mometasone (ELOCON) 0.1 % cream Apply 1 application topically daily. 45 g 0  . Olopatadine HCl 0.2 % SOLN Apply 1 drop to eye daily. 2.5 mL 6  . prenatal vitamin w/FE, FA (PRENATAL 1 + 1) 27-1 MG TABS tablet Take 1 tablet by mouth daily before breakfast. 30 each 11   No current facility-administered medications on file prior to visit.     BP 118/88   Temp 98 F (36.7 C)   Wt 246 lb (111.6 kg)   BMI 42.23 kg/m       Objective:   Physical Exam  Constitutional: She is oriented to person, place, and time. She appears well-developed and well-nourished. No distress.  Cardiovascular: Normal rate, regular rhythm, normal heart sounds and intact distal pulses.  Pulmonary/Chest: Effort normal and breath sounds normal.  Neurological: She is alert and oriented to person, place, and time.  Skin: Skin is warm and dry. Capillary refill takes  less than 2 seconds. She is not diaphoretic.  Psychiatric: She has a normal mood and affect. Her behavior is normal. Judgment and thought content normal.  Nursing note and vitals reviewed.     Assessment & Plan:  1. Class 3 severe obesity due to excess calories without serious comorbidity with body mass index (BMI) of 45.0 to 49.9 in adult [facility]) - Per our scale she has lost 2 pounds. She looks as though she is losing weight but gaining muscle mass. Will keep her on Phentermine 30 mg for the time being. One prescription sent in and one printed off to get her through the holiday season.  - Follow up in January 2020  - phentermine 30 MG capsule; Take 1 capsule (30 mg total) by mouth every morning.  Dispense: 30 capsule; Refill: 0  2. Seasonal allergies - Olopatadine HCl 0.2 % SOLN; Apply 1 drop to eye daily.  Dispense: 2.5 mL; Refill: 6   Shirline Frees, NP

## 2018-06-28 ENCOUNTER — Ambulatory Visit: Payer: BC Managed Care – PPO | Admitting: Adult Health

## 2018-09-06 ENCOUNTER — Encounter: Payer: Self-pay | Admitting: Adult Health

## 2018-09-27 ENCOUNTER — Encounter: Payer: Self-pay | Admitting: Adult Health

## 2018-09-27 NOTE — Telephone Encounter (Signed)
Pt is now scheduled to see [facility] on 4/23/12020.  Nothing further needed.

## 2018-09-28 ENCOUNTER — Other Ambulatory Visit: Payer: Self-pay

## 2018-09-28 ENCOUNTER — Ambulatory Visit (INDEPENDENT_AMBULATORY_CARE_PROVIDER_SITE_OTHER): Payer: BC Managed Care – PPO | Admitting: Adult Health

## 2018-09-28 ENCOUNTER — Encounter: Payer: Self-pay | Admitting: Adult Health

## 2018-09-28 VITALS — Wt 266.0 lb

## 2018-09-28 DIAGNOSIS — Z6841 Body Mass Index (BMI) 40.0 and over, adult: Secondary | ICD-10-CM

## 2018-09-28 MED ORDER — PHENTERMINE HCL 30 MG PO CAPS: 30.0000 mg | ORAL_CAPSULE | ORAL | 0 refills | Status: DC

## 2018-09-28 NOTE — Progress Notes (Signed)
Virtual Visit via Video Note  I connected with Lyvonne Baker-Sellers on 09/28/18 at  8:30 AM EDT by a video enabled telemedicine application and verified that I am speaking with the correct person using two identifiers.  Location patient: home Location provider:work or home office Persons participating in the virtual visit: patient, provider  I discussed the limitations of evaluation and management by telemedicine and the availability of in person appointments. The patient expressed understanding and agreed to proceed.   HPI: 33 year old female who is being evaluated today for follow-up regarding class III obesity.  Would like to go back on phentermine.  She was last on phentermine in October, at this time she is working with a Systems analyst 3 days a week and doing weight training.  She was eating lean meats, fruits and vegetables and was not eating out.  At this time her weight had dropped to 246 pounds.  Only since not been able to go to the gym she has been walking 1 to 2 miles a day and eating a high-protein diet.  Unfortunately her weight has gone up to 266.   ROS: See pertinent positives and negatives per HPI.  Past Medical History:  Diagnosis Date  . Allergy   . Eczema   . History of anemia   . Obesity     Past Surgical History:  Procedure Laterality Date  . CESAREAN SECTION    . TOOTH EXTRACTION N/A     Family History  Problem Relation Age of Onset  . Alcohol abuse Mother   . Drug abuse Mother   . Bipolar disorder Mother   . Hypertension Paternal Aunt   . Hypertension Paternal Grandmother   . Prostate cancer Paternal Grandfather       Current Outpatient Medications:  .  fluticasone (FLONASE) 50 MCG/ACT nasal spray, Place 2 sprays into both nostrils daily., Disp: 16 g, Rfl: 6 .  mometasone (ELOCON) 0.1 % cream, Apply 1 application topically daily., Disp: 45 g, Rfl: 0 .  Olopatadine HCl 0.2 % SOLN, Apply 1 drop to eye daily., Disp: 2.5 mL, Rfl: 6 .  phentermine  30 MG capsule, Take 1 capsule (30 mg total) by mouth every morning., Disp: 30 capsule, Rfl: 0 .  prenatal vitamin w/FE, FA (PRENATAL 1 + 1) 27-1 MG TABS tablet, Take 1 tablet by mouth daily before breakfast., Disp: 30 each, Rfl: 11  EXAM:  VITALS per patient if applicable:  GENERAL: alert, oriented, appears well and in no acute distress  HEENT: atraumatic, conjunttiva clear, no obvious abnormalities on inspection of external nose and ears  NECK: normal movements of the head and neck  LUNGS: on inspection no signs of respiratory distress, breathing rate appears normal, no obvious gross SOB, gasping or wheezing  CV: no obvious cyanosis  MS: moves all visible extremities without noticeable abnormality  PSYCH/NEURO: pleasant and cooperative, no obvious depression or anxiety, speech and thought processing grossly intact  ASSESSMENT AND PLAN:  Discussed the following assessment and plan:  No diagnosis found.     I discussed the assessment and treatment plan with the patient. The patient was provided an opportunity to ask questions and all were answered. The patient agreed with the plan and demonstrated an understanding of the instructions.   The patient was advised to call back or seek an in-person evaluation if the symptoms worsen or if the condition fails to improve as anticipated.   Shirline Frees, NP

## 2018-11-02 ENCOUNTER — Other Ambulatory Visit: Payer: Self-pay

## 2018-11-02 ENCOUNTER — Ambulatory Visit (INDEPENDENT_AMBULATORY_CARE_PROVIDER_SITE_OTHER): Payer: BC Managed Care – PPO | Admitting: Adult Health

## 2018-11-02 ENCOUNTER — Encounter: Payer: Self-pay | Admitting: Adult Health

## 2018-11-02 DIAGNOSIS — Z6841 Body Mass Index (BMI) 40.0 and over, adult: Secondary | ICD-10-CM | POA: Diagnosis not present

## 2018-11-02 MED ORDER — PHENTERMINE HCL 30 MG PO CAPS: 30.0000 mg | ORAL_CAPSULE | ORAL | 0 refills | Status: DC

## 2018-11-02 NOTE — Progress Notes (Signed)
Subjective:    Patient ID: Lauren Powell, female    DOB: 1986-02-19, 33 y.o.   MRN: [id number]  HPI 33 year old female who  has a past medical history of Allergy, Eczema, History of anemia, and Obesity.  She presents to the office today for follow-up regarding obesity.  Last month she was placed back on phentermine 30 mg daily after a hiatus of 6 months.  Since gyms continue to be closed due to COVID restriction she has been walking 1 to 2 miles a day, riding a bike, and eating a heart healthy diet.   She denies headaches, insomnia, or palpitations.   She has been able to lose three pounds over the last month   Wt Readings from Last 3 Encounters:  11/02/18 263 lb (119.3 kg)  09/28/18 266 lb (120.7 kg)  03/30/18 246 lb (111.6 kg)      Review of Systems See HPI   Past Medical History:  Diagnosis Date  . Allergy   . Eczema   . History of anemia   . Obesity     Social History   Socioeconomic History  . Marital status: Single    Spouse name: Not on file  . Number of children: Not on file  . Years of education: Not on file  . Highest education level: Not on file  Occupational History  . Not on file  Social Needs  . Financial resource strain: Not on file  . Food insecurity:    Worry: Not on file    Inability: Not on file  . Transportation needs:    Medical: Not on file    Non-medical: Not on file  Tobacco Use  . Smoking status: Never Smoker  . Smokeless tobacco: Never Used  Substance and Sexual Activity  . Alcohol use: No  . Drug use: No  . Sexual activity: Yes    Partners: Male    Birth control/protection: None  Lifestyle  . Physical activity:    Days per week: Not on file    Minutes per session: Not on file  . Stress: Not on file  Relationships  . Social connections:    Talks on phone: Not on file    Gets together: Not on file    Attends religious service: Not on file    Active member of club or organization: Not on file    Attends meetings  of clubs or organizations: Not on file    Relationship status: Not on file  . Intimate partner violence:    Fear of current or ex partner: Not on file    Emotionally abused: Not on file    Physically abused: Not on file    Forced sexual activity: Not on file  Other Topics Concern  . Not on file  Social History Narrative   Patient lives with son   Teaches pre school and kindergarten.     Past Surgical History:  Procedure Laterality Date  . CESAREAN SECTION    . TOOTH EXTRACTION N/A     Family History  Problem Relation Age of Onset  . Alcohol abuse Mother   . Drug abuse Mother   . Bipolar disorder Mother   . Hypertension Paternal Aunt   . Hypertension Paternal Grandmother   . Prostate cancer Paternal Grandfather     No Known Allergies  Current Outpatient Medications on File Prior to Visit  Medication Sig Dispense Refill  . mometasone (ELOCON) 0.1 % cream Apply 1 application topically daily. 45  g 0  . Olopatadine HCl 0.2 % SOLN Apply 1 drop to eye daily. 2.5 mL 6  . prenatal vitamin w/FE, FA (PRENATAL 1 + 1) 27-1 MG TABS tablet Take 1 tablet by mouth daily before breakfast. 30 each 11   No current facility-administered medications on file prior to visit.     BP 118/80   Temp 98.3 F (36.8 C)   Wt 263 lb (119.3 kg)   BMI 45.14 kg/m       Objective:   Physical Exam Vitals signs and nursing note reviewed.  Constitutional:      Appearance: Normal appearance.  Eyes:     Extraocular Movements: Extraocular movements intact.     Conjunctiva/sclera: Conjunctivae normal.     Pupils: Pupils are equal, round, and reactive to light.  Cardiovascular:     Rate and Rhythm: Normal rate and regular rhythm.     Pulses: Normal pulses.     Heart sounds: Normal heart sounds.  Pulmonary:     Effort: Pulmonary effort is normal.     Breath sounds: Normal breath sounds.  Skin:    Capillary Refill: Capillary refill takes less than 2 seconds.  Neurological:     General: No focal  deficit present.     Mental Status: She is alert and oriented to person, place, and time.  Psychiatric:        Mood and Affect: Mood normal.        Behavior: Behavior normal.        Thought Content: Thought content normal.        Judgment: Judgment normal.       Assessment & Plan:  1. Class 3 severe obesity due to excess calories without serious comorbidity with body mass index (BMI) of 45.0 to 49.9 in adult [facility]) - Continue to walk and ride bike as well as eat a heart healthy diet. Try pushing for further distances. I would like her to shoot for 5 pound weight loss over the next month  - phentermine 30 MG capsule; Take 1 capsule (30 mg total) by mouth every morning.  Dispense: 30 capsule; Refill: 0 - Follow up in 30 days   Shirline Frees, NP

## 2018-12-04 ENCOUNTER — Telehealth: Payer: Self-pay | Admitting: *Deleted

## 2018-12-04 NOTE — Telephone Encounter (Signed)
Patient called to schedule a medication follow up visit and an appt was scheduled for 7/1.  Is this Ok to be an in office visit or should this be a virtual visit?  Message sent to [facility].

## 2018-12-05 NOTE — Telephone Encounter (Signed)
In office visit ok.  Nothing further needed.

## 2018-12-06 ENCOUNTER — Ambulatory Visit: Payer: BC Managed Care – PPO | Admitting: Adult Health

## 2018-12-06 ENCOUNTER — Encounter: Payer: Self-pay | Admitting: Adult Health

## 2018-12-06 ENCOUNTER — Other Ambulatory Visit: Payer: Self-pay

## 2018-12-06 DIAGNOSIS — Z6841 Body Mass Index (BMI) 40.0 and over, adult: Secondary | ICD-10-CM

## 2018-12-06 MED ORDER — PHENTERMINE HCL 37.5 MG PO CAPS: 37.5000 mg | ORAL_CAPSULE | ORAL | 0 refills | Status: DC

## 2018-12-06 NOTE — Progress Notes (Signed)
Subjective:    Patient ID: Lauren Powell, female    DOB: 22-Dec-1985, 33 y.o.   MRN: [id number]  HPI  32 year old female who  has a past medical history of Allergy, Eczema, History of anemia, and Obesity.  She presents to the clinic today for follow up regarding obesity management.This is currently her third month on phentermine. She has been able to lose 2 pounds over the last month and 5 pounds total.   She is riding her bike about 4 miles a day and walking an additional 1-2 miles.   Her diet consists of a lot of fruits and vegetables with some tuna fish.   She notices that when she looks in a mirror she sees weight loss but does not see it on a scale.   Wt Readings from Last 3 Encounters:  12/06/18 261 lb (118.4 kg)  11/02/18 263 lb (119.3 kg)  09/28/18 266 lb (120.7 kg)    Review of Systems See HPI   Past Medical History:  Diagnosis Date  . Allergy   . Eczema   . History of anemia   . Obesity     Social History   Socioeconomic History  . Marital status: Single    Spouse name: Not on file  . Number of children: Not on file  . Years of education: Not on file  . Highest education level: Not on file  Occupational History  . Not on file  Social Needs  . Financial resource strain: Not on file  . Food insecurity    Worry: Not on file    Inability: Not on file  . Transportation needs    Medical: Not on file    Non-medical: Not on file  Tobacco Use  . Smoking status: Never Smoker  . Smokeless tobacco: Never Used  Substance and Sexual Activity  . Alcohol use: No  . Drug use: No  . Sexual activity: Yes    Partners: Male    Birth control/protection: None  Lifestyle  . Physical activity    Days per week: Not on file    Minutes per session: Not on file  . Stress: Not on file  Relationships  . Social Herbalist on phone: Not on file    Gets together: Not on file    Attends religious service: Not on file    Active member of club or  organization: Not on file    Attends meetings of clubs or organizations: Not on file    Relationship status: Not on file  . Intimate partner violence    Fear of current or ex partner: Not on file    Emotionally abused: Not on file    Physically abused: Not on file    Forced sexual activity: Not on file  Other Topics Concern  . Not on file  Social History Narrative   Patient lives with son   Teaches pre school and kindergarten.     Past Surgical History:  Procedure Laterality Date  . CESAREAN SECTION    . TOOTH EXTRACTION N/A     Family History  Problem Relation Age of Onset  . Alcohol abuse Mother   . Drug abuse Mother   . Bipolar disorder Mother   . Hypertension Paternal Aunt   . Hypertension Paternal Grandmother   . Prostate cancer Paternal Grandfather     No Known Allergies  Current Outpatient Medications on File Prior to Visit  Medication Sig Dispense Refill  .  mometasone (ELOCON) 0.1 % cream Apply 1 application topically daily. 45 g 0  . Olopatadine HCl 0.2 % SOLN Apply 1 drop to eye daily. 2.5 mL 6  . phentermine 30 MG capsule Take 1 capsule (30 mg total) by mouth every morning. 30 capsule 0  . prenatal vitamin w/FE, FA (PRENATAL 1 + 1) 27-1 MG TABS tablet Take 1 tablet by mouth daily before breakfast. 30 each 11   No current facility-administered medications on file prior to visit.     BP 124/88   Temp 97.6 F (36.4 C)   Wt 261 lb (118.4 kg)   BMI 44.80 kg/m       Objective:   Physical Exam Vitals signs and nursing note reviewed.  Constitutional:      Appearance: Normal appearance.  Cardiovascular:     Rate and Rhythm: Normal rate and regular rhythm.     Pulses: Normal pulses.     Heart sounds: Normal heart sounds.  Pulmonary:     Effort: Pulmonary effort is normal.     Breath sounds: Normal breath sounds.  Skin:    General: Skin is dry.     Capillary Refill: Capillary refill takes less than 2 seconds.  Neurological:     General: No focal  deficit present.     Mental Status: She is alert and oriented to person, place, and time.  Psychiatric:        Mood and Affect: Mood normal.        Behavior: Behavior normal.        Thought Content: Thought content normal.        Judgment: Judgment normal.       Assessment & Plan:  1. Class 3 severe obesity due to excess calories without serious comorbidity with body mass index (BMI) of 45.0 to 49.9 in adult [facility](HCC) - encouraged to continue to do what she is doing. She needs to take into consideration increasing muscle mass from bike riding.  - Will increase phentermine from 30 mg to 37.5 mg.  - phentermine 37.5 MG capsule; Take 1 capsule (37.5 mg total) by mouth every morning.  Dispense: 30 capsule; Refill: 0 - Follow up in one month   Shirline Freesory Romey Cohea, NP

## 2018-12-21 ENCOUNTER — Encounter: Payer: Self-pay | Admitting: Adult Health

## 2018-12-21 ENCOUNTER — Ambulatory Visit (INDEPENDENT_AMBULATORY_CARE_PROVIDER_SITE_OTHER): Payer: BC Managed Care – PPO | Admitting: Adult Health

## 2018-12-21 ENCOUNTER — Other Ambulatory Visit: Payer: Self-pay

## 2018-12-21 VITALS — BP 116/86 | Temp 98.2°F | Ht 64.5 in | Wt 260.0 lb

## 2018-12-21 DIAGNOSIS — L309 Dermatitis, unspecified: Secondary | ICD-10-CM

## 2018-12-21 DIAGNOSIS — J302 Other seasonal allergic rhinitis: Secondary | ICD-10-CM

## 2018-12-21 DIAGNOSIS — Z6841 Body Mass Index (BMI) 40.0 and over, adult: Secondary | ICD-10-CM

## 2018-12-21 DIAGNOSIS — Z114 Encounter for screening for human immunodeficiency virus [HIV]: Secondary | ICD-10-CM

## 2018-12-21 DIAGNOSIS — Z0000 Encounter for general adult medical examination without abnormal findings: Secondary | ICD-10-CM | POA: Diagnosis not present

## 2018-12-21 LAB — COMPREHENSIVE METABOLIC PANEL
ALT: 9 U/L (ref 0–35)
AST: 11 U/L (ref 0–37)
Albumin: 4.1 g/dL (ref 3.5–5.2)
Alkaline Phosphatase: 67 U/L (ref 39–117)
BUN: 6 mg/dL (ref 6–23)
CO2: 30 mEq/L (ref 19–32)
Calcium: 8.8 mg/dL (ref 8.4–10.5)
Chloride: 103 mEq/L (ref 96–112)
Creatinine, Ser: 0.84 mg/dL (ref 0.40–1.20)
GFR: 93.82 mL/min (ref >60.00)
Glucose, Bld: 97 mg/dL (ref 70–99)
Potassium: 3.6 mEq/L (ref 3.5–5.1)
Sodium: 140 mEq/L (ref 135–145)
Total Bilirubin: 1 mg/dL (ref 0.2–1.2)
Total Protein: 6.9 g/dL (ref 6.0–8.3)

## 2018-12-21 LAB — TSH: TSH: 1.96 u[IU]/mL (ref 0.35–4.50)

## 2018-12-21 LAB — CBC WITH DIFFERENTIAL/PLATELET
Basophils Absolute: 0.1 10*3/uL (ref 0.0–0.1)
Basophils Relative: 0.9 % (ref 0.0–3.0)
Eosinophils Absolute: 0.1 10*3/uL (ref 0.0–0.7)
Eosinophils Relative: 1.7 % (ref 0.0–5.0)
HCT: 38.1 % (ref 36.0–46.0)
Hemoglobin: 12.3 g/dL (ref 12.0–15.0)
Lymphocytes Relative: 28.5 % (ref 12.0–46.0)
Lymphs Abs: 2 10*3/uL (ref 0.7–4.0)
MCHC: 32.2 g/dL (ref 30.0–36.0)
MCV: 81.2 fl (ref 78.0–100.0)
Monocytes Absolute: 0.3 10*3/uL (ref 0.1–1.0)
Monocytes Relative: 4.5 % (ref 3.0–12.0)
Neutro Abs: 4.4 10*3/uL (ref 1.4–7.7)
Neutrophils Relative %: 64.4 % (ref 43.0–77.0)
Platelets: 293 10*3/uL (ref 150.0–400.0)
RBC: 4.69 Mil/uL (ref 3.87–5.11)
RDW: 15.3 % (ref 11.5–15.5)
WBC: 6.9 10*3/uL (ref 4.0–10.5)

## 2018-12-21 LAB — LIPID PANEL
Cholesterol: 157 mg/dL (ref 0–200)
HDL: 50.9 mg/dL (ref >39.00)
LDL Cholesterol: 93 mg/dL (ref 0–99)
NonHDL: 106.44
Total CHOL/HDL Ratio: 3
Triglycerides: 67 mg/dL (ref 0.0–149.0)
VLDL: 13.4 mg/dL (ref 0.0–40.0)

## 2018-12-21 LAB — HEMOGLOBIN A1C: Hgb A1c MFr Bld: 5.7 % (ref 4.6–6.5)

## 2018-12-21 LAB — VITAMIN D 25 HYDROXY (VIT D DEFICIENCY, FRACTURES): VITD: 21.44 ng/mL — ABNORMAL LOW (ref 30.00–100.00)

## 2018-12-21 LAB — IBC + FERRITIN
Ferritin: 21.5 ng/mL (ref 10.0–291.0)
Iron: 59 ug/dL (ref 42–145)
Saturation Ratios: 16.4 % — ABNORMAL LOW (ref 20.0–50.0)
Transferrin: 257 mg/dL (ref 212.0–360.0)

## 2018-12-21 MED ORDER — PHENTERMINE HCL 37.5 MG PO CAPS: 37.5000 mg | ORAL_CAPSULE | ORAL | 0 refills | Status: DC

## 2018-12-21 NOTE — Progress Notes (Signed)
Subjective:    Patient ID: Irena Cords, female    DOB: December 09, 1985, 33 y.o.   MRN: [id number]  HPI Patient presents for yearly preventative medicine examination. She is a pleasant 33 year old female who  has a past medical history of Allergy, Eczema, History of anemia, and Obesity.  Seasonal Allergies - Controlled with Flonase  Eczema - used steroid cream PRN   Obesity - She is currently on her fourth month of phentermine 37.5 mg. She is riding a bike about 4 miles per day and is walking an additional 1-2 miles. Her diet consists of a lot of fruits and vegetables with some fish. She feels as though her clothes are starting to fit looser. She has lost a total of 6 pounds  Wt Readings from Last 3 Encounters:  12/21/18 260 lb (117.9 kg)  12/06/18 261 lb (118.4 kg)  11/02/18 263 lb (119.3 kg)   All immunizations and health maintenance protocols were reviewed with the patient and needed orders were placed. She is up to date on vaccinations  Appropriate screening laboratory values were ordered for the patient including screening of hyperlipidemia, renal function and hepatic function.  Medication reconciliation,  past medical history, social history, problem list and allergies were reviewed in detail with the patient  Goals were established with regard to weight loss, exercise, and  diet in compliance with medications  She is up to date on routine GYN exams and participates in dental and vision screens.    Review of Systems  Constitutional: Negative.   HENT: Negative.   Eyes: Negative.   Respiratory: Negative.   Cardiovascular: Negative.   Gastrointestinal: Negative.   Endocrine: Negative.   Genitourinary: Negative.   Musculoskeletal: Negative.   Skin: Negative.   Allergic/Immunologic: Negative.   Neurological: Negative.   Hematological: Negative.   Psychiatric/Behavioral: Negative.   All other systems reviewed and are negative.  Past Medical History:  Diagnosis  Date  . Allergy   . Eczema   . History of anemia   . Obesity     Social History   Socioeconomic History  . Marital status: Single    Spouse name: Not on file  . Number of children: Not on file  . Years of education: Not on file  . Highest education level: Not on file  Occupational History  . Not on file  Social Needs  . Financial resource strain: Not on file  . Food insecurity    Worry: Not on file    Inability: Not on file  . Transportation needs    Medical: Not on file    Non-medical: Not on file  Tobacco Use  . Smoking status: Never Smoker  . Smokeless tobacco: Never Used  Substance and Sexual Activity  . Alcohol use: No  . Drug use: No  . Sexual activity: Yes    Partners: Male    Birth control/protection: None  Lifestyle  . Physical activity    Days per week: Not on file    Minutes per session: Not on file  . Stress: Not on file  Relationships  . Social Herbalist on phone: Not on file    Gets together: Not on file    Attends religious service: Not on file    Active member of club or organization: Not on file    Attends meetings of clubs or organizations: Not on file    Relationship status: Not on file  . Intimate partner violence  Fear of current or ex partner: Not on file    Emotionally abused: Not on file    Physically abused: Not on file    Forced sexual activity: Not on file  Other Topics Concern  . Not on file  Social History Narrative   Patient lives with son   Teaches pre school and kindergarten.     Past Surgical History:  Procedure Laterality Date  . CESAREAN SECTION    . TOOTH EXTRACTION N/A     Family History  Problem Relation Age of Onset  . Alcohol abuse Mother   . Drug abuse Mother   . Bipolar disorder Mother   . Hypertension Paternal Aunt   . Hypertension Paternal Grandmother   . Prostate cancer Paternal Grandfather     No Known Allergies  Current Outpatient Medications on File Prior to Visit  Medication Sig  Dispense Refill  . mometasone (ELOCON) 0.1 % cream Apply 1 application topically daily. 45 g 0  . Olopatadine HCl 0.2 % SOLN Apply 1 drop to eye daily. 2.5 mL 6  . phentermine 37.5 MG capsule Take 1 capsule (37.5 mg total) by mouth every morning. 30 capsule 0  . prenatal vitamin w/FE, FA (PRENATAL 1 + 1) 27-1 MG TABS tablet Take 1 tablet by mouth daily before breakfast. 30 each 11   No current facility-administered medications on file prior to visit.     BP 116/86   Temp 98.2 F (36.8 C)   Ht 5' 4.5" (1.638 m)   Wt 260 lb (117.9 kg)   BMI 43.94 kg/m       Objective:   Physical Exam Vitals signs and nursing note reviewed.  Constitutional:      General: She is not in acute distress.    Appearance: Normal appearance. She is obese. She is not diaphoretic.  HENT:     Head: Normocephalic and atraumatic.     Right Ear: Tympanic membrane, ear canal and external ear normal. There is no impacted cerumen.     Left Ear: Tympanic membrane, ear canal and external ear normal. There is no impacted cerumen.     Nose: Nose normal. No congestion or rhinorrhea.     Mouth/Throat:     Mouth: Mucous membranes are moist.     Pharynx: Oropharynx is clear. No oropharyngeal exudate or posterior oropharyngeal erythema.  Eyes:     General: No scleral icterus.       Right eye: No discharge.        Left eye: No discharge.     Conjunctiva/sclera: Conjunctivae normal.     Pupils: Pupils are equal, round, and reactive to light.  Neck:     Musculoskeletal: Normal range of motion and neck supple.     Thyroid: No thyromegaly.     Vascular: No JVD.     Trachea: No tracheal deviation.  Cardiovascular:     Rate and Rhythm: Normal rate and regular rhythm.     Pulses: Normal pulses.     Heart sounds: Normal heart sounds. No murmur. No friction rub. No gallop.   Pulmonary:     Effort: Pulmonary effort is normal. No respiratory distress.     Breath sounds: Normal breath sounds. No stridor. No wheezing, rhonchi  or rales.  Chest:     Chest wall: No tenderness.  Abdominal:     General: Abdomen is flat. Bowel sounds are normal. There is no distension.     Palpations: Abdomen is soft. There is no mass.  Tenderness: There is no abdominal tenderness. There is no right CVA tenderness, left CVA tenderness, guarding or rebound.     Hernia: No hernia is present.  Musculoskeletal: Normal range of motion.        General: No swelling, tenderness, deformity or signs of injury.     Right lower leg: No edema.     Left lower leg: No edema.  Lymphadenopathy:     Cervical: No cervical adenopathy.  Skin:    General: Skin is warm and dry.     Capillary Refill: Capillary refill takes less than 2 seconds.     Coloration: Skin is not jaundiced or pale.     Findings: No bruising, erythema or rash.  Neurological:     General: No focal deficit present.     Mental Status: She is alert and oriented to person, place, and time.     Cranial Nerves: No cranial nerve deficit.     Motor: No weakness or abnormal muscle tone.     Coordination: Coordination normal.     Gait: Gait normal.     Deep Tendon Reflexes: Reflexes normal.  Psychiatric:        Mood and Affect: Mood normal.        Behavior: Behavior normal.        Thought Content: Thought content normal.        Judgment: Judgment normal.       Assessment & Plan:  1. Routine general medical examination at a health care facility - One year follow up  - CBC with Differential/Platelet - Comprehensive metabolic panel - Lipid panel - TSH - Hemoglobin A1c - IBC + Ferritin  2. Class 3 severe obesity due to excess calories without serious comorbidity with body mass index (BMI) of 45.0 to 49.9 in adult [facility](HCC) - Increase duration and intensity of exercise - Follow up in September  - CBC with Differential/Platelet - Comprehensive metabolic panel - Lipid panel - TSH - Hemoglobin A1c - Vitamin D, 25-hydroxy - phentermine 37.5 MG capsule; Take 1 capsule (37.5 mg  total) by mouth every morning.  Dispense: 30 capsule; Refill: 0  3. Eczema, unspecified type - Steroid cream PRN   4. Seasonal allergies - Continue with OTC  5. Encounter for screening for HIV  - HIV Antibody (routine testing w rflx)  Shirline Freesory Mekhia Brogan, NP

## 2018-12-22 LAB — HIV ANTIBODY (ROUTINE TESTING W REFLEX): HIV 1&2 Ab, 4th Generation: NONREACTIVE

## 2019-01-10 ENCOUNTER — Encounter: Payer: Self-pay | Admitting: Adult Health

## 2019-01-10 NOTE — Telephone Encounter (Signed)
Pt is scheduled for appt on 01/11/2019.  Nothing further needed.

## 2019-01-11 ENCOUNTER — Other Ambulatory Visit: Payer: Self-pay

## 2019-01-11 ENCOUNTER — Telehealth (INDEPENDENT_AMBULATORY_CARE_PROVIDER_SITE_OTHER): Payer: BC Managed Care – PPO | Admitting: Adult Health

## 2019-01-11 ENCOUNTER — Other Ambulatory Visit (INDEPENDENT_AMBULATORY_CARE_PROVIDER_SITE_OTHER): Payer: BC Managed Care – PPO

## 2019-01-11 DIAGNOSIS — Z113 Encounter for screening for infections with a predominantly sexual mode of transmission: Secondary | ICD-10-CM | POA: Diagnosis not present

## 2019-01-11 NOTE — Progress Notes (Signed)
Virtual Visit via Video Note  I connected with Ruhi Baker-Sellers on 01/11/19 at 11:00 AM EDT by a video enabled telemedicine application and verified that I am speaking with the correct person using two identifiers.  Location patient: home Location provider:work or home office Persons participating in the virtual visit: patient, provider  I discussed the limitations of evaluation and management by telemedicine and the availability of in person appointments. The patient expressed understanding and agreed to proceed.   HPI:  Patient reports that she received a phone call yesterday from the health department.  She was name by someone who possibly was in contact with someone who tested positive for syphilis.  Reports that she last had sex with this person in January, she contacted him and he reported that he tested negative for syphilis.  Is the only partner she has had in the last 2 years.  She denies symptoms  Was tested last month at her annual exam for HIV which was negative  ROS: See pertinent positives and negatives per HPI.  Past Medical History:  Diagnosis Date  . Allergy   . Eczema   . History of anemia   . Obesity     Past Surgical History:  Procedure Laterality Date  . CESAREAN SECTION    . TOOTH EXTRACTION N/A     Family History  Problem Relation Age of Onset  . Alcohol abuse Mother   . Drug abuse Mother   . Bipolar disorder Mother   . Hypertension Paternal Aunt   . Hypertension Paternal Grandmother   . Prostate cancer Paternal Grandfather       Current Outpatient Medications:  .  mometasone (ELOCON) 0.1 % cream, Apply 1 application topically daily., Disp: 45 g, Rfl: 0 .  Olopatadine HCl 0.2 % SOLN, Apply 1 drop to eye daily., Disp: 2.5 mL, Rfl: 6 .  phentermine 37.5 MG capsule, Take 1 capsule (37.5 mg total) by mouth every morning., Disp: 30 capsule, Rfl: 0 .  prenatal vitamin w/FE, FA (PRENATAL 1 + 1) 27-1 MG TABS tablet, Take 1 tablet by mouth daily  before breakfast., Disp: 30 each, Rfl: 11  EXAM:  VITALS per patient if applicable:  GENERAL: alert, oriented, appears well and in no acute distress  HEENT: atraumatic, conjunttiva clear, no obvious abnormalities on inspection of external nose and ears  NECK: normal movements of the head and neck  LUNGS: on inspection no signs of respiratory distress, breathing rate appears normal, no obvious gross SOB, gasping or wheezing  CV: no obvious cyanosis  MS: moves all visible extremities without noticeable abnormality  PSYCH/NEURO: pleasant and cooperative, no obvious depression or anxiety, speech and thought processing grossly intact  ASSESSMENT AND PLAN:  Discussed the following assessment and plan:  1. Screen for STD (sexually transmitted disease) - RPR; Future - treat as needed    I discussed the assessment and treatment plan with the patient. The patient was provided an opportunity to ask questions and all were answered. The patient agreed with the plan and demonstrated an understanding of the instructions.   The patient was advised to call back or seek an in-person evaluation if the symptoms worsen or if the condition fails to improve as anticipated.   Dorothyann Peng, NP

## 2019-01-12 ENCOUNTER — Encounter: Payer: Self-pay | Admitting: Adult Health

## 2019-01-12 LAB — RPR: RPR Ser Ql: NONREACTIVE

## 2019-01-30 ENCOUNTER — Encounter: Payer: Self-pay | Admitting: Adult Health

## 2019-01-30 ENCOUNTER — Other Ambulatory Visit: Payer: Self-pay | Admitting: Adult Health

## 2019-01-30 ENCOUNTER — Other Ambulatory Visit: Payer: Self-pay

## 2019-01-30 DIAGNOSIS — Z20822 Contact with and (suspected) exposure to covid-19: Secondary | ICD-10-CM

## 2019-01-31 LAB — NOVEL CORONAVIRUS, NAA: SARS-CoV-2, NAA: NOT DETECTED

## 2019-02-01 ENCOUNTER — Telehealth: Payer: Self-pay | Admitting: Adult Health

## 2019-02-01 NOTE — Telephone Encounter (Signed)
Negative COVID results given. Patient results "NOT Detected." Caller expressed understanding. ° °

## 2019-04-30 HISTORY — PX: TONSILLECTOMY: SUR1361

## 2019-06-21 ENCOUNTER — Encounter: Payer: Self-pay | Admitting: Obstetrics

## 2019-06-25 ENCOUNTER — Other Ambulatory Visit: Payer: Self-pay | Admitting: Obstetrics and Gynecology

## 2019-07-01 ENCOUNTER — Other Ambulatory Visit: Payer: Self-pay

## 2019-07-01 ENCOUNTER — Other Ambulatory Visit (HOSPITAL_COMMUNITY)
Admission: AD | Admit: 2019-07-01 | Discharge: 2019-07-01 | Disposition: A | Discharge status: Home / Self Care | Payer: BC Managed Care – PPO | Source: Ambulatory Visit | Attending: Obstetrics and Gynecology | Admitting: Obstetrics and Gynecology

## 2019-07-01 DIAGNOSIS — N979 Female infertility, unspecified: Secondary | ICD-10-CM | POA: Diagnosis present

## 2019-07-01 LAB — TSH: TSH: 1.239 u[IU]/mL (ref 0.350–4.500)

## 2019-07-01 LAB — PROCALCITONIN: Procalcitonin: <0.1 ng/mL

## 2019-07-02 LAB — PROGESTERONE: Progesterone: 0.3 ng/mL

## 2019-07-04 LAB — PROLACTIN: Prolactin: 18.7 ng/mL (ref 4.8–23.3)

## 2019-07-17 ENCOUNTER — Encounter (INDEPENDENT_AMBULATORY_CARE_PROVIDER_SITE_OTHER): Payer: Self-pay

## 2019-07-17 ENCOUNTER — Other Ambulatory Visit: Payer: Self-pay | Admitting: Adult Health

## 2019-07-17 DIAGNOSIS — J302 Other seasonal allergic rhinitis: Secondary | ICD-10-CM

## 2019-07-18 NOTE — Telephone Encounter (Signed)
Ok for refill? 

## 2019-07-23 ENCOUNTER — Encounter: Payer: Self-pay | Admitting: Adult Health

## 2019-07-24 ENCOUNTER — Other Ambulatory Visit: Payer: Self-pay | Admitting: Adult Health

## 2019-07-24 NOTE — Telephone Encounter (Signed)
Please advise 

## 2019-07-30 ENCOUNTER — Encounter (INDEPENDENT_AMBULATORY_CARE_PROVIDER_SITE_OTHER): Payer: Self-pay

## 2019-07-30 ENCOUNTER — Other Ambulatory Visit: Payer: Self-pay | Admitting: Obstetrics and Gynecology

## 2019-07-31 ENCOUNTER — Other Ambulatory Visit: Payer: Self-pay

## 2019-07-31 ENCOUNTER — Encounter (HOSPITAL_BASED_OUTPATIENT_CLINIC_OR_DEPARTMENT_OTHER): Payer: Self-pay | Admitting: Obstetrics and Gynecology

## 2019-07-31 NOTE — Progress Notes (Addendum)
Spoke w/ via phone for pre-op interview---[facility] Lab needs dos----   Cbc, urine preg         COVID test ------08-02-2019 Arrive at -------530 am 08-06-2019 NPO after ------midnight Medications to take morning of surgery -----eye drop prn Diabetic medication -----n/a Patient Special Instructions ----- Pre-Op special Istructions ----- Patient verbalized understanding of instructions that were given at this phone interview. Patient denies shortness of breath, chest pain, fever, cough a this phone interview.  SPOKE WITH JESSICA ZANETTO PA OK TO PROCEED

## 2019-08-02 ENCOUNTER — Other Ambulatory Visit (HOSPITAL_COMMUNITY)
Admission: RE | Admit: 2019-08-02 | Discharge: 2019-08-02 | Disposition: A | Discharge status: Home / Self Care | Payer: BC Managed Care – PPO | Source: Ambulatory Visit | Attending: Obstetrics and Gynecology | Admitting: Obstetrics and Gynecology

## 2019-08-02 DIAGNOSIS — Z01812 Encounter for preprocedural laboratory examination: Secondary | ICD-10-CM | POA: Diagnosis present

## 2019-08-02 DIAGNOSIS — Z20822 Contact with and (suspected) exposure to covid-19: Secondary | ICD-10-CM | POA: Diagnosis not present

## 2019-08-02 LAB — SARS CORONAVIRUS 2 (TAT 6-24 HRS): SARS Coronavirus 2: NEGATIVE

## 2019-08-05 NOTE — H&P (Signed)
Lauren Powell is an 34 y.o. female. G1P1 MF here for  Dx hysteroscopy, D&C ? Polypectomy due to round filling defect on HSG  Pertinent Gynecological History: Menses: flow is moderate Bleeding: dysfunctional uterine bleeding Contraception: none DES exposure: denies Blood transfusions: none Sexually transmitted diseases: no past history Previous GYN Procedures: n/a  Last mammogram: n/a Date: n/a Last pap: normal Date: 2020 OB History: G1, P1   Menstrual History: Menarche age: n/a Patient's last menstrual period was 07/14/2019.    Past Medical History:  Diagnosis Date  . Allergy   . Eczema   . History of anemia   . Obesity     Past Surgical History:  Procedure Laterality Date  . CESAREAN SECTION    . TONSILLECTOMY  04/30/2019  . TOOTH EXTRACTION N/A     Family History  Problem Relation Age of Onset  . Alcohol abuse Mother   . Drug abuse Mother   . Bipolar disorder Mother   . Hypertension Paternal Aunt   . Hypertension Paternal Grandmother   . Prostate cancer Paternal Grandfather     Social History:  reports that she has never smoked. She has never used smokeless tobacco. She reports that she does not drink alcohol or use drugs.  Allergies: No Known Allergies  No medications prior to admission.    Review of Systems  All other systems reviewed and are negative.   Height 5\' 4"  (1.626 m), weight 124.7 kg, last menstrual period 07/14/2019. Physical Exam  Constitutional: She is oriented to person, place, and time. She appears well-developed and well-nourished.  HENT:  Head: Atraumatic.  Eyes: EOM are normal.  Cardiovascular: Regular rhythm.  Respiratory: Breath sounds normal.  GI: Soft.  Pfannenstiel incision  Genitourinary:    Genitourinary Comments: Vulva nl Cervix closed Uterus av nl Adnexa nl   Musculoskeletal:        General: Normal range of motion.     Cervical back: Neck supple.  Neurological: She is alert and oriented to person, place,  and time.  Skin: Skin is warm and dry.  Psychiatric: She has a normal mood and affect.    No results found for this or any previous visit (from the past 24 hour(s)).  No results found.  Assessment/Plan: Endometrial mass Infertility P) dx hysteroscopy, D&C polyp removal if present. Risk of surgery reviewed including infection, bleeding, injury to surrounding organ structures, internal scar tissue, fluid overload and its mgmt, thermal injury, uterine perforation ( 06/998) and its risk. All ? answered  Franky Reier A Sidharth Leverette 08/05/2019, 8:54 AM   Addendum 3/1  I have reexamined pt No change since last visit

## 2019-08-06 ENCOUNTER — Ambulatory Visit (HOSPITAL_BASED_OUTPATIENT_CLINIC_OR_DEPARTMENT_OTHER)
Admission: RE | Admit: 2019-08-06 | Discharge: 2019-08-06 | Disposition: A | Discharge status: Home / Self Care | Payer: BC Managed Care – PPO | Attending: Obstetrics and Gynecology | Admitting: Obstetrics and Gynecology

## 2019-08-06 ENCOUNTER — Other Ambulatory Visit: Payer: Self-pay

## 2019-08-06 ENCOUNTER — Ambulatory Visit (HOSPITAL_BASED_OUTPATIENT_CLINIC_OR_DEPARTMENT_OTHER): Payer: BC Managed Care – PPO | Admitting: Physician Assistant

## 2019-08-06 ENCOUNTER — Encounter (HOSPITAL_BASED_OUTPATIENT_CLINIC_OR_DEPARTMENT_OTHER): Payer: Self-pay | Admitting: Obstetrics and Gynecology

## 2019-08-06 ENCOUNTER — Encounter (HOSPITAL_BASED_OUTPATIENT_CLINIC_OR_DEPARTMENT_OTHER)
Admission: RE | Disposition: A | Discharge status: Home / Self Care | Payer: Self-pay | Source: Home / Self Care | Attending: Obstetrics and Gynecology

## 2019-08-06 DIAGNOSIS — N920 Excessive and frequent menstruation with regular cycle: Secondary | ICD-10-CM | POA: Diagnosis not present

## 2019-08-06 DIAGNOSIS — Z6841 Body Mass Index (BMI) 40.0 and over, adult: Secondary | ICD-10-CM | POA: Diagnosis not present

## 2019-08-06 DIAGNOSIS — E669 Obesity, unspecified: Secondary | ICD-10-CM | POA: Diagnosis not present

## 2019-08-06 DIAGNOSIS — N840 Polyp of corpus uteri: Secondary | ICD-10-CM | POA: Diagnosis present

## 2019-08-06 HISTORY — PX: DILATATION & CURETTAGE/HYSTEROSCOPY WITH MYOSURE: SHX6511

## 2019-08-06 LAB — CBC
HCT: 39 % (ref 36.0–46.0)
Hemoglobin: 11.9 g/dL — ABNORMAL LOW (ref 12.0–15.0)
MCH: 24.6 pg — ABNORMAL LOW (ref 26.0–34.0)
MCHC: 30.5 g/dL (ref 30.0–36.0)
MCV: 80.6 fL (ref 80.0–100.0)
Platelets: 295 10*3/uL (ref 150–400)
RBC: 4.84 MIL/uL (ref 3.87–5.11)
RDW: 14.6 % (ref 11.5–15.5)
WBC: 8.7 10*3/uL (ref 4.0–10.5)
nRBC: 0 % (ref 0.0–0.2)

## 2019-08-06 LAB — TSH: TSH: 3.089 u[IU]/mL (ref 0.350–4.500)

## 2019-08-06 LAB — POCT PREGNANCY, URINE: Preg Test, Ur: NEGATIVE

## 2019-08-06 SURGERY — DILATATION & CURETTAGE/HYSTEROSCOPY WITH MYOSURE
Anesthesia: General | Site: Vagina

## 2019-08-06 MED ORDER — SCOPOLAMINE 1 MG/3DAYS TD PT72
MEDICATED_PATCH | TRANSDERMAL | Status: DC | PRN
  Administered 2019-08-06: 1 via TRANSDERMAL

## 2019-08-06 MED ORDER — FENTANYL CITRATE (PF) 100 MCG/2ML IJ SOLN
INTRAMUSCULAR | Status: DC | PRN
  Administered 2019-08-06 (×2): 50 ug via INTRAVENOUS

## 2019-08-06 MED ORDER — ONDANSETRON HCL 4 MG/2ML IJ SOLN
4.0000 mg | Freq: Once | INTRAMUSCULAR | Status: DC | PRN
  Filled 2019-08-06: qty 2

## 2019-08-06 MED ORDER — SODIUM CHLORIDE 0.9 % IR SOLN
Status: DC | PRN
  Administered 2019-08-06: 3000 mL

## 2019-08-06 MED ORDER — MIDAZOLAM HCL 5 MG/5ML IJ SOLN
INTRAMUSCULAR | Status: DC | PRN
  Administered 2019-08-06: 2 mg via INTRAVENOUS

## 2019-08-06 MED ORDER — MEPERIDINE HCL 25 MG/ML IJ SOLN
6.2500 mg | INTRAMUSCULAR | Status: DC | PRN
  Filled 2019-08-06: qty 1

## 2019-08-06 MED ORDER — LIDOCAINE 2% (20 MG/ML) 5 ML SYRINGE
INTRAMUSCULAR | Status: DC | PRN
  Administered 2019-08-06: 100 mg via INTRAVENOUS

## 2019-08-06 MED ORDER — KETOROLAC TROMETHAMINE 30 MG/ML IJ SOLN
INTRAMUSCULAR | Status: AC
  Filled 2019-08-06: qty 1

## 2019-08-06 MED ORDER — DEXAMETHASONE SODIUM PHOSPHATE 10 MG/ML IJ SOLN
INTRAMUSCULAR | Status: AC
  Filled 2019-08-06: qty 1

## 2019-08-06 MED ORDER — DEXAMETHASONE SODIUM PHOSPHATE 10 MG/ML IJ SOLN
INTRAMUSCULAR | Status: DC | PRN
  Administered 2019-08-06: 10 mg via INTRAVENOUS

## 2019-08-06 MED ORDER — ONDANSETRON HCL 4 MG/2ML IJ SOLN
INTRAMUSCULAR | Status: AC
  Filled 2019-08-06: qty 2

## 2019-08-06 MED ORDER — HYDROMORPHONE HCL 1 MG/ML IJ SOLN
0.2500 mg | INTRAMUSCULAR | Status: DC | PRN
  Filled 2019-08-06: qty 0.5

## 2019-08-06 MED ORDER — IBUPROFEN 800 MG PO TABS: 800.0000 mg | ORAL_TABLET | Freq: Three times a day (TID) | ORAL | 2 refills | Status: DC | PRN

## 2019-08-06 MED ORDER — MIDAZOLAM HCL 2 MG/2ML IJ SOLN
INTRAMUSCULAR | Status: AC
  Filled 2019-08-06: qty 2

## 2019-08-06 MED ORDER — KETOROLAC TROMETHAMINE 30 MG/ML IJ SOLN
INTRAMUSCULAR | Status: DC | PRN
  Administered 2019-08-06: 30 mg via INTRAVENOUS

## 2019-08-06 MED ORDER — LIDOCAINE 2% (20 MG/ML) 5 ML SYRINGE
INTRAMUSCULAR | Status: AC
  Filled 2019-08-06: qty 5

## 2019-08-06 MED ORDER — FENTANYL CITRATE (PF) 100 MCG/2ML IJ SOLN
INTRAMUSCULAR | Status: AC
  Filled 2019-08-06: qty 2

## 2019-08-06 MED ORDER — SCOPOLAMINE 1 MG/3DAYS TD PT72
MEDICATED_PATCH | TRANSDERMAL | Status: AC
  Filled 2019-08-06: qty 1

## 2019-08-06 MED ORDER — PROPOFOL 10 MG/ML IV BOLUS
INTRAVENOUS | Status: DC | PRN
  Administered 2019-08-06: 180 mg via INTRAVENOUS

## 2019-08-06 MED ORDER — ONDANSETRON HCL 4 MG/2ML IJ SOLN
INTRAMUSCULAR | Status: DC | PRN
  Administered 2019-08-06: 4 mg via INTRAVENOUS

## 2019-08-06 MED ORDER — PROPOFOL 10 MG/ML IV BOLUS
INTRAVENOUS | Status: AC
  Filled 2019-08-06: qty 40

## 2019-08-06 MED ORDER — LACTATED RINGERS IV SOLN
INTRAVENOUS | Status: DC
  Administered 2019-08-06: 50 mL/h via INTRAVENOUS
  Filled 2019-08-06: qty 1000

## 2019-08-06 SURGICAL SUPPLY — 25 items
BIPOLAR CUTTING LOOP 21FR (ELECTRODE)
CANISTER SUCT 3000ML PPV (MISCELLANEOUS) ×3 IMPLANT
CATH ROBINSON RED A/P 16FR (CATHETERS) IMPLANT
COVER WAND RF STERILE (DRAPES) ×3 IMPLANT
DEVICE MYOSURE LITE (MISCELLANEOUS) ×2 IMPLANT
DEVICE MYOSURE REACH (MISCELLANEOUS) IMPLANT
DILATOR CANAL MILEX (MISCELLANEOUS) IMPLANT
GAUZE 4X4 16PLY RFD (DISPOSABLE) ×3 IMPLANT
GLOVE BIOGEL PI IND STRL 7.0 (GLOVE) ×1 IMPLANT
GLOVE BIOGEL PI INDICATOR 7.0 (GLOVE) ×2
GLOVE ECLIPSE 6.5 STRL STRAW (GLOVE) ×3 IMPLANT
GOWN STRL REUS W/TWL LRG LVL3 (GOWN DISPOSABLE) ×3 IMPLANT
IV NS IRRIG 3000ML ARTHROMATIC (IV SOLUTION) ×5 IMPLANT
KIT PROCEDURE FLUENT (KITS) ×3 IMPLANT
KIT TURNOVER CYSTO (KITS) ×3 IMPLANT
LOOP CUTTING BIPOLAR 21FR (ELECTRODE) IMPLANT
MYOSURE XL FIBROID (MISCELLANEOUS)
PACK VAGINAL MINOR WOMEN LF (CUSTOM PROCEDURE TRAY) ×3 IMPLANT
PAD OB MATERNITY 4.3X12.25 (PERSONAL CARE ITEMS) ×3 IMPLANT
PAD PREP 24X48 CUFFED NSTRL (MISCELLANEOUS) ×3 IMPLANT
SEAL CERVICAL OMNI LOK (ABLATOR) IMPLANT
SEAL ROD LENS SCOPE MYOSURE (ABLATOR) ×3 IMPLANT
SYSTEM TISS REMOVAL MYOSURE XL (MISCELLANEOUS) IMPLANT
TOWEL OR 17X26 10 PK STRL BLUE (TOWEL DISPOSABLE) ×3 IMPLANT
WATER STERILE IRR 500ML POUR (IV SOLUTION) ×3 IMPLANT

## 2019-08-06 NOTE — Brief Op Note (Signed)
08/06/2019  8:14 AM  PATIENT:  Lauren Powell  34 y.o. female  PRE-OPERATIVE DIAGNOSIS:  Menorrhagia, Endometrial Polyps  POST-OPERATIVE DIAGNOSIS:  Menorrhagia, Endometrial Polyps  PROCEDURE:  diagnostic hysteroscopy, hysteroscopic resection of endometrial polyps, dilation and curettage  SURGEON:  Surgeon(s) and Role:    * Maxie Better, MD - Primary  PHYSICIAN ASSISTANT:   ASSISTANTS: none   ANESTHESIA:   general FINDINGS; multiple polypoid lesions, endometrial thickening, tubal ostia seen EBL:  5 mL   BLOOD ADMINISTERED:none  DRAINS: none   LOCAL MEDICATIONS USED:  NONE  SPECIMEN:  Source of Specimen:  emc with polyps  DISPOSITION OF SPECIMEN:  PATHOLOGY  COUNTS:  YES  TOURNIQUET:  * No tourniquets in log *  DICTATION: .Other Dictation: Dictation Number [id number]  PLAN OF CARE: Discharge to home after PACU  PATIENT DISPOSITION:  PACU - hemodynamically stable.   Delay start of Pharmacological VTE agent (>24hrs) due to surgical blood loss or risk of bleeding: no

## 2019-08-06 NOTE — Transfer of Care (Signed)
Immediate Anesthesia Transfer of Care Note  Patient: Lauren Powell  Procedure(s) Performed: DILATATION & CURETTAGE/HYSTEROSCOPY WITH MYOSURE (N/A Vagina )  Patient Location: PACU  Anesthesia Type:General  Level of Consciousness: awake, alert  and oriented  Airway & Oxygen Therapy: Patient Spontanous Breathing and Patient connected to nasal cannula oxygen  Post-op Assessment: Report given to RN  Post vital signs: Reviewed and stable  Last Vitals: 127/58 Vitals Value Taken Time  BP    Temp    Pulse 95 08/06/19 0815  Resp 17 08/06/19 0815  SpO2 100 % 08/06/19 0815  Vitals shown include unvalidated device data.  Last Pain:  Vitals:   08/06/19 0557  TempSrc: Oral  PainSc: 0-No pain      Patients Stated Pain Goal: 6 (08/06/19 0557)  Complications: No apparent anesthesia complications

## 2019-08-06 NOTE — Anesthesia Procedure Notes (Signed)
Procedure Name: LMA Insertion Date/Time: 08/06/2019 7:39 AM Performed by: Briant Sites, CRNA Pre-anesthesia Checklist: Patient identified, Emergency Drugs available, Suction available and Patient being monitored Patient Re-evaluated:Patient Re-evaluated prior to induction Oxygen Delivery Method: Circle system utilized Preoxygenation: Pre-oxygenation with 100% oxygen Induction Type: IV induction Ventilation: Mask ventilation without difficulty LMA: LMA inserted LMA Size: 4.0 Number of attempts: 1 Airway Equipment and Method: Bite block Placement Confirmation: positive ETCO2 Tube secured with: Tape Dental Injury: Teeth and Oropharynx as per pre-operative assessment

## 2019-08-06 NOTE — Anesthesia Postprocedure Evaluation (Signed)
Anesthesia Post Note  Patient: Lauren Powell  Procedure(s) Performed: DILATATION & CURETTAGE/HYSTEROSCOPY WITH MYOSURE (N/A Vagina )     Patient location during evaluation: PACU Anesthesia Type: General Level of consciousness: awake and alert Pain management: pain level controlled Vital Signs Assessment: post-procedure vital signs reviewed and stable Respiratory status: spontaneous breathing, nonlabored ventilation, respiratory function stable and patient connected to nasal cannula oxygen Cardiovascular status: blood pressure returned to baseline and stable Postop Assessment: no apparent nausea or vomiting Anesthetic complications: no    Last Vitals:  Vitals:   08/06/19 0845 08/06/19 0920  BP: 126/79 115/85  Pulse: 88 70  Resp: 15 13  Temp:  36.9 C  SpO2: 96% 99%    Last Pain:  Vitals:   08/06/19 0925  TempSrc:   PainSc: 3                  Ivis Henneman DAVID

## 2019-08-06 NOTE — Discharge Instructions (Signed)
  Post Anesthesia Home Care Instructions  Activity: Get plenty of rest for the remainder of the day. A responsible adult should stay with you for 24 hours following the procedure.  For the next 24 hours, DO NOT: -Drive a car -Operate machinery -Drink alcoholic beverages -Take any medication unless instructed by your physician -Make any legal decisions or sign important papers.  Meals: Start with liquid foods such as gelatin or soup. Progress to regular foods as tolerated. Avoid greasy, spicy, heavy foods. If nausea and/or vomiting occur, drink only clear liquids until the nausea and/or vomiting subsides. Call your physician if vomiting continues.  Special Instructions/Symptoms: Your throat may feel dry or sore from the anesthesia or the breathing tube placed in your throat during surgery. If this causes discomfort, gargle with warm salt water. The discomfort should disappear within 24 hours.  If you had a scopolamine patch placed behind your ear for the management of post- operative nausea and/or vomiting:  1. The medication in the patch is effective for 72 hours, after which it should be removed.  Wrap patch in a tissue and discard in the trash. Wash hands thoroughly with soap and water. 2. You may remove the patch earlier than 72 hours if you experience unpleasant side effects which may include dry mouth, dizziness or visual disturbances. 3. Avoid touching the patch. Wash your hands with soap and water after contact with the patch.   DISCHARGE INSTRUCTIONS: HYSTEROSCOPY / ENDOMETRIAL ABLATION The following instructions have been prepared to help you care for yourself upon your return home.  May Remove Scop patch on or before  May take Ibuprofen after  May take stool softner while taking narcotic pain medication to prevent constipation.  Drink plenty of water.  Personal hygiene: . Use sanitary pads for vaginal drainage, not tampons. . Shower the day after your procedure. . NO tub  baths, pools or Jacuzzis for 2-3 weeks. . Wipe front to back after using the bathroom.  Activity and limitations: . Do NOT drive or operate any equipment for 24 hours. The effects of anesthesia are still present and drowsiness may result. . Do NOT rest in bed all day. . Walking is encouraged. . Walk up and down stairs slowly. . You may resume your normal activity in one to two days or as indicated by your physician. Sexual activity: NO intercourse for at least 2 weeks after the procedure, or as indicated by your Doctor.  Diet: Eat a light meal as desired this evening. You may resume your usual diet tomorrow.  Return to Work: You may resume your work activities in one to two days or as indicated by your Doctor.  What to expect after your surgery: Expect to have vaginal bleeding/discharge for 2-3 days and spotting for up to 10 days. It is not unusual to have soreness for up to 1-2 weeks. You may have a slight burning sensation when you urinate for the first day. Mild cramps may continue for a couple of days. You may have a regular period in 2-6 weeks.  Call your doctor for any of the following: . Excessive vaginal bleeding or clotting, saturating and changing one pad every hour. . Inability to urinate 6 hours after discharge from hospital. . Pain not relieved by pain medication. . Fever of 100.4 F or greater. . Unusual vaginal discharge or odor.  Return to office _________________Call for an appointment ___________________ Patient's signature: ______________________ Nurse's signature ________________________  Post Anesthesia Care Unit [phone] 

## 2019-08-06 NOTE — Op Note (Signed)
NAME: [name], FURNESS MEDICAL RECORD YB:[id number] ACCOUNT [id number] DATE OF BIRTH:Jan 11, 1986 FACILITY: [facility] LOCATION: [facility] PHYSICIAN:Davy Westmoreland A. Evlyn Amason, MD  OPERATIVE REPORT  DATE OF PROCEDURE:  08/06/2019  PREOPERATIVE DIAGNOSIS:  Endometrial polyps, menorrhagia.  PROCEDURE:  Diagnostic hysteroscopy, hysteroscopic resection of endometrial polyps, D and C.  POSTOPERATIVE DIAGNOSIS:  Endometrial polyps, menorrhagia.  ANESTHESIA:  General.  SURGEON:  Maxie Better, MD  ASSISTANT:  None  DESCRIPTION OF PROCEDURE:  Under adequate general anesthesia, the patient was placed in the dorsal lithotomy position.  She was sterilely prepped and draped in the usual fashion.  Bladder was catheterized for a small amount of urine.  Examination under  anesthesia revealed an anteverted uterus.  No adnexal masses could be appreciated.  Bivalve speculum was placed in the vagina.  Single tooth tenaculum was placed on the anterior lip of the cervix.  The cervix was then serially dilated up to #[facility]  dilator.  A diagnostic hysteroscope was introduced into the uterine cavity.  Endometrial thickening and polypoid lesions were noted throughout.  Using the LITE resectoscope, the endometrium thickening and polypoid lesions were all resected.  The tubal  ostia could then be seen.  The endocervical canal was without any lesions.  When the procedure was felt to be complete, all instruments were then removed from the vagina.  SPECIMEN:  Labeled endometrial curettings with polyp were sent to pathology.  ESTIMATED BLOOD LOSS:  Minimal.  COMPLICATIONS:  None.  FLUID DEFICIT:  275 mL.  DISPOSITION:  The patient tolerated the procedure well and was transferred to recovery in stable condition.  CN/NUANCE  D:08/06/2019 T:08/06/2019 JOB:[id number]

## 2019-08-06 NOTE — Anesthesia Preprocedure Evaluation (Signed)
Anesthesia Evaluation  Patient identified by MRN, date of birth, ID band Patient awake    Reviewed: Allergy & Precautions, NPO status , Patient's Chart, lab work & pertinent test results  Airway Mallampati: II  TM Distance: >3 FB Neck ROM: Full    Dental   Pulmonary    Pulmonary exam normal        Cardiovascular Normal cardiovascular exam     Neuro/Psych    GI/Hepatic   Endo/Other    Renal/GU      Musculoskeletal   Abdominal   Peds  Hematology   Anesthesia Other Findings   Reproductive/Obstetrics                             Anesthesia Physical Anesthesia Plan  ASA: III  Anesthesia Plan: General   Post-op Pain Management:    Induction: Intravenous  PONV Risk Score and Plan: 3 and Ondansetron, Midazolam and Dexamethasone  Airway Management Planned: LMA  Additional Equipment:   Intra-op Plan:   Post-operative Plan: Extubation in OR  Informed Consent: I have reviewed the patients History and Physical, chart, labs and discussed the procedure including the risks, benefits and alternatives for the proposed anesthesia with the patient or authorized representative who has indicated his/her understanding and acceptance.       Plan Discussed with: Surgeon  Anesthesia Plan Comments:         Anesthesia Quick Evaluation

## 2019-08-07 ENCOUNTER — Ambulatory Visit (INDEPENDENT_AMBULATORY_CARE_PROVIDER_SITE_OTHER): Payer: BC Managed Care – PPO | Admitting: Family Medicine

## 2019-08-07 VITALS — BP 112/78 | HR 74 | Temp 98.0°F | Ht 65.0 in | Wt 276.0 lb

## 2019-08-07 DIAGNOSIS — R0602 Shortness of breath: Secondary | ICD-10-CM

## 2019-08-07 DIAGNOSIS — Z9189 Other specified personal risk factors, not elsewhere classified: Secondary | ICD-10-CM

## 2019-08-07 DIAGNOSIS — E559 Vitamin D deficiency, unspecified: Secondary | ICD-10-CM | POA: Diagnosis not present

## 2019-08-07 DIAGNOSIS — R5383 Other fatigue: Secondary | ICD-10-CM

## 2019-08-07 DIAGNOSIS — Z1331 Encounter for screening for depression: Secondary | ICD-10-CM

## 2019-08-07 DIAGNOSIS — N979 Female infertility, unspecified: Secondary | ICD-10-CM

## 2019-08-07 DIAGNOSIS — Z6841 Body Mass Index (BMI) 40.0 and over, adult: Secondary | ICD-10-CM

## 2019-08-07 DIAGNOSIS — G4709 Other insomnia: Secondary | ICD-10-CM

## 2019-08-07 DIAGNOSIS — R7303 Prediabetes: Secondary | ICD-10-CM | POA: Diagnosis not present

## 2019-08-07 DIAGNOSIS — Z0289 Encounter for other administrative examinations: Secondary | ICD-10-CM

## 2019-08-07 LAB — PROGESTERONE: Progesterone: 2 ng/mL

## 2019-08-07 LAB — SURGICAL PATHOLOGY

## 2019-08-07 LAB — PROLACTIN: Prolactin: 35.5 ng/mL — ABNORMAL HIGH (ref 4.8–23.3)

## 2019-08-07 MED ORDER — METFORMIN HCL 500 MG PO TABS: 500.0000 mg | ORAL_TABLET | Freq: Every day | ORAL | 0 refills | Status: DC

## 2019-08-07 NOTE — Addendum Note (Signed)
Addendum  created 08/07/19 0715 by Briant Sites, CRNA   Charge Capture section accepted

## 2019-08-07 NOTE — Progress Notes (Signed)
Dear Lauren Peng, NP,   Thank you for referring Lauren Powell to our clinic. The following note includes my evaluation and treatment recommendations.  Chief Complaint:   OBESITY Lauren Powell (MR# [id number]) is a 34 y.o. female who presents for evaluation and treatment of obesity and related comorbidities. Current BMI is Body mass index is 45.93 kg/m. Lauren Powell has been struggling with her weight for many years and has been unsuccessful in either losing weight, maintaining weight loss, or reaching her healthy weight goal.  Lauren Powell is currently in the action stage of change and ready to dedicate time achieving and maintaining a healthier weight. Lauren Powell is interested in becoming our patient and working on intensive lifestyle modifications including (but not limited to) diet and exercise for weight loss.  Lauren Powell had a recent procedure to remove uterine polyps.  She teaches kindergarten in person.  She says she has adjusted well.  She has a 2 year old son.  She is hoping to conceive again.  Lauren Powell is being followed for infertility.  She knows that she has great muscle mass.  Lauren Powell's habits were reviewed today and are as follows: Her family eats meals together, she thinks her family will eat healthier with her, her desired weight loss is 77 pounds, she has been heavy most of her life, she started gaining weight after having a child, her heaviest weight ever was 278 pounds, she craves sugar, she skips breakfast and sometimes lunch, she is frequently drinking liquids with calories, she frequently makes poor food choices, she frequently eats larger portions than normal and she struggles with emotional eating.  Depression Screen Lauren Powell's Food and Mood (modified PHQ-9) score was 7.  Depression screen PHQ 2/9 08/07/2019  Decreased Interest 1  Down, Depressed, Hopeless 1  PHQ - 2 Score 2  Altered sleeping 1  Tired, decreased energy 3  Change in appetite 0  Feeling bad or failure about  yourself  1  Trouble concentrating 0  Moving slowly or fidgety/restless 0  Suicidal thoughts 0  PHQ-9 Score 7  Difficult doing work/chores Not difficult at all   Subjective:   1. Other fatigue Lauren Powell denies daytime somnolence and reports waking up still tired. Patent has a history of symptoms of morning fatigue. Lauren Powell generally gets 5-7 hours of sleep per night, and states that she has generally restful sleep at times. Snoring is present. Apneic episodes are not present. Epworth Sleepiness Score is 8.  2. SOB (shortness of breath) on exertion Lauren Powell notes increasing shortness of breath with exercising and seems to be worsening over time with weight gain. She notes getting out of breath sooner with activity than she used to. This has gotten worse recently. Lauren Powell denies shortness of breath at rest or orthopnea.  3. Prediabetes Lauren Powell has a diagnosis of prediabetes based on her elevated HgA1c and was informed this puts her at greater risk of developing diabetes. She continues to work on diet and exercise to decrease her risk of diabetes. She denies nausea or hypoglycemia.  Lab Results  Component Value Date   HGBA1C 5.7 12/21/2018   4. Vitamin D deficiency Lauren Powell's Vitamin D level was 21.44 on 12/21/2018. She is not currently taking vit D. She denies nausea, vomiting or muscle weakness.  5. Infertility, female Lauren Powell is followed by GYN.  6. Other insomnia Symptoms include: is not rested upon awakening and has snoring. Estimated average sleep per night: 7-9 hours.  7. Depression screening Lauren Powell was screened for depression as a routine part  of her new patient workup.  8. At risk for diabetes mellitus Lauren Powell is at higher than average risk for developing diabetes due to her obesity.   Assessment/Plan:   1. Other fatigue Lauren Powell does feel that her weight is causing her energy to be lower than it should be. Fatigue may be related to obesity, depression or many other causes. Labs will be ordered,  and in the meanwhile, Lauren Powell will focus on self care including making healthy food choices, increasing physical activity and focusing on stress reduction.  Orders - EKG 12-Lead - TSH - T4, free - T3 - Anemia panel  2. SOB (shortness of breath) on exertion Lauren Powell notes increasing shortness of breath with exercising and seems to be worsening over time with weight gain. She notes getting out of breath sooner with activity than she used to. This has gotten worse recently. Lauren Powell denies shortness of breath at rest or orthopnea. - Comprehensive metabolic panel - CBC with Differential/Platelet - Lipid panel  3. Prediabetes Lauren Powell will continue to work on weight loss, exercise, and decreasing simple carbohydrates to help decrease the risk of diabetes.   Orders - Hemoglobin A1c - Insulin, random - metFORMIN (GLUCOPHAGE) 500 MG tablet; Take 1 tablet (500 mg total) by mouth daily.  Dispense: 30 tablet; Refill: 0  4. Vitamin D deficiency Low Vitamin D level contributes to fatigue and are associated with obesity, breast, and colon cancer.  Orders - VITAMIN D 25 Hydroxy (Vit-D Deficiency, Fractures)  5. Infertility, female Will follow along.  6. Other insomnia The problem of recurrent insomnia was discussed. Orders and follow up as documented in patient record. Counseling: Intensive lifestyle modifications are the first line treatment for this issue. We discussed several lifestyle modifications today and she will continue to work on diet, exercise and weight loss efforts.   Counseling  Limit or avoid alcohol, caffeinated beverages, and cigarettes, especially close to bedtime.   Do not eat a large meal or eat spicy foods right before bedtime. This can lead to digestive discomfort that can make it hard for you to sleep.  Keep a sleep diary to help you and your health care provider figure out what could be causing your insomnia.  . Make your bedroom a dark, comfortable place where it is easy to  fall asleep. ? Put up shades or blackout curtains to block light from outside. ? Use a white noise machine to block noise. ? Keep the temperature cool. . Limit screen use before bedtime. This includes: ? Watching TV. ? Using your smartphone, tablet, or computer. . Stick to a routine that includes going to bed and waking up at the same times every day and night. This can help you fall asleep faster. Consider making a quiet activity, such as reading, part of your nighttime routine. . Try to avoid taking naps during the day so that you sleep better at night. . Get out of bed if you are still awake after 15 minutes of trying to sleep. Keep the lights down, but try reading or doing a quiet activity. When you feel sleepy, go back to bed.  7. Depression screening Lauren Powell had a positive depression screening. Depression is commonly associated with obesity and often results in emotional eating behaviors. We will monitor this closely and work on CBT to help improve the non-hunger eating patterns. Referral to Psychology may be required if no improvement is seen as she continues in our clinic.  8. At risk for diabetes mellitus Lauren Powell was given approximately  15 minutes of diabetes education and counseling today. We discussed intensive lifestyle modifications today with an emphasis on weight loss as well as increasing exercise and decreasing simple carbohydrates in her diet. We also reviewed medication options with an emphasis on risk versus benefit of those discussed.   Repetitive spaced learning was employed today to elicit superior memory formation and behavioral change.  9. Class 3 severe obesity with serious comorbidity and body mass index (BMI) of 45.0 to 49.9 in adult, unspecified obesity type (HCC) Lauren Powell is currently in the action stage of change and her goal is to continue with weight loss efforts. I recommend Lauren Powell begin the structured treatment plan as follows:  She has agreed to the Category 3  Plan.  Exercise goals: No exercise has been prescribed at this time.   Behavioral modification strategies: increasing lean protein intake, decreasing simple carbohydrates, increasing vegetables, increasing water intake and decreasing liquid calories.  She was informed of the importance of frequent follow-up visits to maximize her success with intensive lifestyle modifications for her multiple health conditions. She was informed we would discuss her lab results at her next visit unless there is a critical issue that needs to be addressed sooner. Lauren Powell agreed to keep her next visit at the agreed upon time to discuss these results.  Objective:   Blood pressure 112/78, pulse 74, temperature 98 F (36.7 C), temperature source Oral, height 5\' 5"  (1.651 m), weight 276 lb (125.2 kg), last menstrual period 07/14/2019, SpO2 100 %. Body mass index is 45.93 kg/m.  EKG: Normal sinus rhythm, rate 70 bpm.  Indirect Calorimeter completed today shows a VO2 of 311 and a Lauren Powell of 2168.  Her calculated basal metabolic rate is 2169 thus her basal metabolic rate is better than expected.  General: Cooperative, alert, well developed, in no acute distress. HEENT: Conjunctivae and lids unremarkable. Cardiovascular: Regular rhythm.  Lungs: Normal work of breathing. Neurologic: No focal deficits.   Lab Results  Component Value Date   CREATININE 0.84 12/21/2018   BUN 6 12/21/2018   NA 140 12/21/2018   K 3.6 12/21/2018   CL 103 12/21/2018   CO2 30 12/21/2018   Lab Results  Component Value Date   ALT 9 12/21/2018   AST 11 12/21/2018   ALKPHOS 67 12/21/2018   BILITOT 1.0 12/21/2018   Lab Results  Component Value Date   HGBA1C 5.7 12/21/2018   HGBA1C 5.8 12/15/2017   HGBA1C 5.8 12/29/2016   HGBA1C 6.0 05/29/2015   HGBA1C 5.7 01/08/2015   No results found for: INSULIN Lab Results  Component Value Date   TSH 3.089 08/06/2019   Lab Results  Component Value Date   CHOL 157 12/21/2018   HDL 50.90  12/21/2018   LDLCALC 93 12/21/2018   TRIG 67.0 12/21/2018   CHOLHDL 3 12/21/2018   Lab Results  Component Value Date   WBC 8.7 08/06/2019   HGB 11.9 (L) 08/06/2019   HCT 39.0 08/06/2019   MCV 80.6 08/06/2019   PLT 295 08/06/2019   Lab Results  Component Value Date   IRON 59 12/21/2018   TIBC 344 12/15/2017   FERRITIN 21.5 12/21/2018   Attestation Statements:   This is the patient's first visit at [facility]. The patient's NEW PATIENT PACKET was reviewed at length. Included in the packet: current and past health history, medications, allergies, ROS, gynecologic history (women only), surgical history, family history, social history, weight history, weight loss surgery history (for those that have had weight loss  surgery), nutritional evaluation, mood and food questionnaire, PHQ9, Epworth questionnaire, sleep habits questionnaire, patient life and health improvement goals questionnaire. These will all be scanned into the patient's chart under media.   During the visit, I independently reviewed the patient's EKG, bioimpedance scale results, and indirect calorimeter results. I used this information to tailor a meal plan for the patient that will help her to lose weight and will improve her obesity-related conditions going forward. I performed a medically necessary appropriate examination and/or evaluation. I discussed the assessment and treatment plan with the patient. The patient was provided an opportunity to ask questions and all were answered. The patient agreed with the plan and demonstrated an understanding of the instructions. Labs were ordered at this visit and will be reviewed at the next visit unless more critical results need to be addressed immediately. Clinical information was updated and documented in the EMR.   I, Insurance claims handler, CMA, am acting as Energy manager for [employer], DO.  I have reviewed the above documentation for accuracy and completeness, and I  agree with the above. Helane Rima, DO

## 2019-08-08 LAB — COMPREHENSIVE METABOLIC PANEL
ALT: 8 IU/L (ref 0–32)
AST: 11 IU/L (ref 0–40)
Albumin/Globulin Ratio: 1.3 (ref 1.2–2.2)
Albumin: 3.7 g/dL — ABNORMAL LOW (ref 3.8–4.8)
Alkaline Phosphatase: 78 IU/L (ref 39–117)
BUN/Creatinine Ratio: 10 (ref 9–23)
BUN: 8 mg/dL (ref 6–20)
Bilirubin Total: 0.3 mg/dL (ref 0.0–1.2)
CO2: 24 mmol/L (ref 20–29)
Calcium: 8.7 mg/dL (ref 8.7–10.2)
Chloride: 103 mmol/L (ref 96–106)
Creatinine, Ser: 0.8 mg/dL (ref 0.57–1.00)
GFR calc Af Amer: 111 mL/min/{1.73_m2} (ref >59)
GFR calc non Af Amer: 96 mL/min/{1.73_m2} (ref >59)
Globulin, Total: 2.9 g/dL (ref 1.5–4.5)
Glucose: 83 mg/dL (ref 65–99)
Potassium: 3.8 mmol/L (ref 3.5–5.2)
Sodium: 140 mmol/L (ref 134–144)
Total Protein: 6.6 g/dL (ref 6.0–8.5)

## 2019-08-08 LAB — LIPID PANEL
Chol/HDL Ratio: 2.5 ratio (ref 0.0–4.4)
Cholesterol, Total: 158 mg/dL (ref 100–199)
HDL: 63 mg/dL (ref >39)
LDL Chol Calc (NIH): 83 mg/dL (ref 0–99)
Triglycerides: 59 mg/dL (ref 0–149)
VLDL Cholesterol Cal: 12 mg/dL (ref 5–40)

## 2019-08-08 LAB — CBC WITH DIFFERENTIAL/PLATELET
Basophils Absolute: 0.1 10*3/uL (ref 0.0–0.2)
Basos: 0 %
EOS (ABSOLUTE): 0 10*3/uL (ref 0.0–0.4)
Eos: 0 %
Hemoglobin: 11 g/dL — ABNORMAL LOW (ref 11.1–15.9)
Immature Grans (Abs): 0.1 10*3/uL (ref 0.0–0.1)
Immature Granulocytes: 1 %
Lymphocytes Absolute: 2.4 10*3/uL (ref 0.7–3.1)
Lymphs: 15 %
MCH: 25.2 pg — ABNORMAL LOW (ref 26.6–33.0)
MCHC: 32.2 g/dL (ref 31.5–35.7)
MCV: 78 fL — ABNORMAL LOW (ref 79–97)
Monocytes Absolute: 0.7 10*3/uL (ref 0.1–0.9)
Monocytes: 5 %
Neutrophils Absolute: 12.8 10*3/uL — ABNORMAL HIGH (ref 1.4–7.0)
Neutrophils: 79 %
Platelets: 301 10*3/uL (ref 150–450)
RBC: 4.37 x10E6/uL (ref 3.77–5.28)
RDW: 14.3 % (ref 11.7–15.4)
WBC: 16.1 10*3/uL — ABNORMAL HIGH (ref 3.4–10.8)

## 2019-08-08 LAB — VITAMIN D 25 HYDROXY (VIT D DEFICIENCY, FRACTURES): Vit D, 25-Hydroxy: 15.6 ng/mL — ABNORMAL LOW (ref 30.0–100.0)

## 2019-08-08 LAB — ANEMIA PANEL
Ferritin: 25 ng/mL (ref 15–150)
Folate, Hemolysate: 321 ng/mL
Folate, RBC: 939 ng/mL (ref >498)
Hematocrit: 34.2 % (ref 34.0–46.6)
Iron Saturation: 14 % — ABNORMAL LOW (ref 15–55)
Iron: 43 ug/dL (ref 27–159)
Retic Ct Pct: 1.5 % (ref 0.6–2.6)
Total Iron Binding Capacity: 316 ug/dL (ref 250–450)
UIBC: 273 ug/dL (ref 131–425)
Vitamin B-12: 647 pg/mL (ref 232–1245)

## 2019-08-08 LAB — TSH: TSH: 0.974 u[IU]/mL (ref 0.450–4.500)

## 2019-08-08 LAB — HEMOGLOBIN A1C
Est. average glucose Bld gHb Est-mCnc: 114 mg/dL
Hgb A1c MFr Bld: 5.6 % (ref 4.8–5.6)

## 2019-08-08 LAB — T3: T3, Total: 102 ng/dL (ref 71–180)

## 2019-08-08 LAB — T4, FREE: Free T4: 1.15 ng/dL (ref 0.82–1.77)

## 2019-08-08 LAB — INSULIN, RANDOM: INSULIN: 13.2 u[IU]/mL (ref 2.6–24.9)

## 2019-08-09 ENCOUNTER — Encounter (INDEPENDENT_AMBULATORY_CARE_PROVIDER_SITE_OTHER): Payer: Self-pay | Admitting: Family Medicine

## 2019-08-09 NOTE — Telephone Encounter (Signed)
Please advise 

## 2019-08-11 ENCOUNTER — Ambulatory Visit: Payer: BC Managed Care – PPO | Attending: Internal Medicine

## 2019-08-11 DIAGNOSIS — Z23 Encounter for immunization: Secondary | ICD-10-CM

## 2019-08-11 NOTE — Progress Notes (Signed)
   Covid-19 Vaccination Clinic  Name:  SAPPHIRE TYGART    MRN: [id number] DOB: [birth year]  08/11/2019  Ms. Baker-Sellers was observed post Covid-19 immunization for 15 minutes without incident. She was provided with Vaccine Information Sheet and instruction to access the V-Safe system.   Ms. Schutter was instructed to call 911 with any severe reactions post vaccine: Marland Kitchen Difficulty breathing  . Swelling of face and throat  . A fast heartbeat  . A bad rash all over body  . Dizziness and weakness   Immunizations Administered    Name Date Dose VIS Date Route   Pfizer COVID-19 Vaccine 08/11/2019 11:58 AM 0.3 mL 05/18/2019 Intramuscular   Manufacturer: [employer], [employer]   Lot: [id number]   NDC: [address]-9

## 2019-08-14 ENCOUNTER — Telehealth (INDEPENDENT_AMBULATORY_CARE_PROVIDER_SITE_OTHER): Payer: Self-pay

## 2019-08-14 NOTE — Telephone Encounter (Signed)
Per Dr. Earlene Plater called pt informed WBC was elevated pt states she was feeling completely fine. Informed to call back if she begins to have s/s of infection. Verbalized understanding. Jeralene Peters, LPN

## 2019-08-21 ENCOUNTER — Ambulatory Visit (INDEPENDENT_AMBULATORY_CARE_PROVIDER_SITE_OTHER): Payer: BC Managed Care – PPO | Admitting: Family Medicine

## 2019-08-21 ENCOUNTER — Other Ambulatory Visit: Payer: Self-pay

## 2019-08-21 ENCOUNTER — Encounter (INDEPENDENT_AMBULATORY_CARE_PROVIDER_SITE_OTHER): Payer: Self-pay | Admitting: Family Medicine

## 2019-08-21 VITALS — BP 121/80 | HR 75 | Temp 97.9°F | Ht 65.0 in | Wt 273.0 lb

## 2019-08-21 DIAGNOSIS — Z9189 Other specified personal risk factors, not elsewhere classified: Secondary | ICD-10-CM | POA: Diagnosis not present

## 2019-08-21 DIAGNOSIS — D508 Other iron deficiency anemias: Secondary | ICD-10-CM | POA: Diagnosis not present

## 2019-08-21 DIAGNOSIS — E559 Vitamin D deficiency, unspecified: Secondary | ICD-10-CM | POA: Diagnosis not present

## 2019-08-21 DIAGNOSIS — R7303 Prediabetes: Secondary | ICD-10-CM | POA: Diagnosis not present

## 2019-08-21 DIAGNOSIS — Z6841 Body Mass Index (BMI) 40.0 and over, adult: Secondary | ICD-10-CM

## 2019-08-22 NOTE — Progress Notes (Signed)
Chief Complaint:   OBESITY Lauren Powell is here to discuss her progress with her obesity treatment plan along with follow-up of her obesity related diagnoses. Lauren Powell is on the Category 3 Plan and states she is following her eating plan approximately 100% of the time. Lauren Powell states she is walking for 15-20 minutes 4 times per week.  Today's visit was #: 2 Starting weight: 276 lbs Starting date: 08/07/2019 Today's weight: 273 lbs Today's date: 08/21/2019 Total lbs lost to date: 3 lbs Total lbs lost since last in-office visit: 3 lbs  Interim History: Lauren Powell reports that she struggles with the amount of meat she is supposed to be eating.  She is having no cravings and no hunger.  She is drinking raspberry lemonade Crystal Lite.  She says she is sleeping well and going to sleep earlier.  Subjective:   1. Prediabetes Lauren Powell has a diagnosis of prediabetes based on her elevated HgA1c and was informed this puts her at greater risk of developing diabetes. She continues to work on diet and exercise to decrease her risk of diabetes. She denies nausea or hypoglycemia.  She is taking metformin 500 mg daily.  Lab Results  Component Value Date   HGBA1C 5.6 08/07/2019   Lab Results  Component Value Date   INSULIN 13.2 08/07/2019   2. Other iron deficiency anemia Lauren Powell is not a vegetarian.  She does not have a history of weight loss surgery.   CBC Latest Ref Rng & Units 08/07/2019 08/06/2019 12/21/2018  WBC 3.4 - 10.8 x10E3/uL 16.1(H) 8.7 6.9  Hemoglobin 11.1 - 15.9 g/dL 11.0(L) 11.9(L) 12.3  Hematocrit 34.0 - 46.6 % 34.2 39.0 38.1  Platelets 150 - 450 x10E3/uL 301 295 293.0   Lab Results  Component Value Date   IRON 43 08/07/2019   TIBC 316 08/07/2019   FERRITIN 25 08/07/2019   Lab Results  Component Value Date   VITAMINB12 647 08/07/2019   3. Vitamin D deficiency Lauren Powell's Vitamin D level was 15.6 on 08/07/2019. She is not currently taking vit D. She denies nausea, vomiting or muscle  weakness.  Assessment/Plan:   1. Prediabetes Lauren Powell will continue to work on weight loss, exercise, and decreasing simple carbohydrates to help decrease the risk of diabetes.   2. Other iron deficiency anemia Orders and follow up as documented in patient record.  She does not need a supplement yet.  Counseling . Iron is essential for our bodies to make red blood cells.  Reasons that someone may be deficient include: an iron-deficient diet (more likely in those following vegan or vegetarian diets), women with heavy menses, patients with GI disorders or poor absorption, patients that have had bariatric surgery, frequent blood donors, patients with cancer, and patients with heart disease.   Lauren Powell foods include dark leafy greens, red and white meats, eggs, seafood, and beans.   . Certain foods and drinks prevent your body from absorbing iron properly. Avoid eating these foods in the same meal as iron-rich foods or with iron supplements. These foods include: coffee, black tea, and red wine; milk, dairy products, and foods that are high in calcium; beans and soybeans; whole grains.  . Constipation can be a side effect of iron supplementation. Increased water and fiber intake are helpful. Water goal: > 2 liters/day. Fiber goal: > 25 grams/day.  3. Vitamin D deficiency Low Vitamin D level contributes to fatigue and are associated with obesity, breast, and colon cancer. She agrees to continue to take prescription Vitamin D @  50,000 IU every week and will follow-up for routine testing of Vitamin D, at least 2-3 times per year to avoid over-replacement.  Orders - Vitamin D, Ergocalciferol, (DRISDOL) 1.25 MG (50000 UNIT) CAPS capsule; Take 1 capsule (50,000 Units total) by mouth every 7 (seven) days.  Dispense: 4 capsule; Refill: 0  4. At risk for diabetes mellitus Lauren Powell was given approximately 15 minutes of diabetes education and counseling today. We discussed intensive lifestyle modifications today  with an emphasis on weight loss as well as increasing exercise and decreasing simple carbohydrates in her diet. We also reviewed medication options with an emphasis on risk versus benefit of those discussed.   Repetitive spaced learning was employed today to elicit superior memory formation and behavioral change.  5. Class 3 severe obesity with serious comorbidity and body mass index (BMI) of 45.0 to 49.9 in adult, unspecified obesity type (HCC) Lauren Powell is currently in the action stage of change. As such, her goal is to continue with weight loss efforts. She has agreed to the Category 3 Plan.   Exercise goals: As is.  Behavioral modification strategies: increasing lean protein intake, decreasing simple carbohydrates and increasing vegetables.  Lauren Powell has agreed to follow-up with our clinic in 2 weeks. She was informed of the importance of frequent follow-up visits to maximize her success with intensive lifestyle modifications for her multiple health conditions.   Objective:   Blood pressure 121/80, pulse 75, temperature 97.9 F (36.6 C), temperature source Oral, height 5\' 5"  (1.651 m), weight 273 lb (123.8 kg), last menstrual period 08/13/2019, SpO2 100 %. Body mass index is 45.43 kg/m.  General: Cooperative, alert, well developed, in no acute distress. HEENT: Conjunctivae and lids unremarkable. Cardiovascular: Regular rhythm.  Lungs: Normal work of breathing. Neurologic: No focal deficits.   Lab Results  Component Value Date   CREATININE 0.80 08/07/2019   BUN 8 08/07/2019   NA 140 08/07/2019   K 3.8 08/07/2019   CL 103 08/07/2019   CO2 24 08/07/2019   Lab Results  Component Value Date   ALT 8 08/07/2019   AST 11 08/07/2019   ALKPHOS 78 08/07/2019   BILITOT 0.3 08/07/2019   Lab Results  Component Value Date   HGBA1C 5.6 08/07/2019   HGBA1C 5.7 12/21/2018   HGBA1C 5.8 12/15/2017   HGBA1C 5.8 12/29/2016   HGBA1C 6.0 05/29/2015   Lab Results  Component Value Date    INSULIN 13.2 08/07/2019   Lab Results  Component Value Date   TSH 0.974 08/07/2019   Lab Results  Component Value Date   CHOL 158 08/07/2019   HDL 63 08/07/2019   LDLCALC 83 08/07/2019   TRIG 59 08/07/2019   CHOLHDL 2.5 08/07/2019   Lab Results  Component Value Date   WBC 16.1 (H) 08/07/2019   HGB 11.0 (L) 08/07/2019   HCT 34.2 08/07/2019   MCV 78 (L) 08/07/2019   PLT 301 08/07/2019   Lab Results  Component Value Date   IRON 43 08/07/2019   TIBC 316 08/07/2019   FERRITIN 25 08/07/2019   Attestation Statements:   Reviewed by clinician on day of visit: allergies, medications, problem list, medical history, surgical history, family history, social history, and previous encounter notes.  I, 10/07/2019, CMA, am acting as Insurance claims handler for Energy manager, DO.  I have reviewed the above documentation for accuracy and completeness, and I agree with the above. [employer], DO

## 2019-08-23 MED ORDER — VITAMIN D (ERGOCALCIFEROL) 1.25 MG (50000 UNIT) PO CAPS: 50000.0000 [IU] | ORAL_CAPSULE | ORAL | 0 refills | Status: DC

## 2019-08-27 ENCOUNTER — Other Ambulatory Visit (INDEPENDENT_AMBULATORY_CARE_PROVIDER_SITE_OTHER): Payer: Self-pay | Admitting: Family Medicine

## 2019-08-27 DIAGNOSIS — R7303 Prediabetes: Secondary | ICD-10-CM

## 2019-09-01 ENCOUNTER — Ambulatory Visit: Payer: BC Managed Care – PPO | Attending: Internal Medicine

## 2019-09-01 DIAGNOSIS — Z23 Encounter for immunization: Secondary | ICD-10-CM

## 2019-09-01 NOTE — Progress Notes (Signed)
   Covid-19 Vaccination Clinic  Name:  SHALEKA BRINES    MRN: [id number] DOB: 10-24-1985  09/01/2019  Ms. Baker-Sellers was observed post Covid-19 immunization for 15 minutes without incident. She was provided with Vaccine Information Sheet and instruction to access the V-Safe system.   Ms. Riccardi was instructed to call 911 with any severe reactions post vaccine: Marland Kitchen Difficulty breathing  . Swelling of face and throat  . A fast heartbeat  . A bad rash all over body  . Dizziness and weakness   Immunizations Administered    Name Date Dose VIS Date Route   Pfizer COVID-19 Vaccine 09/01/2019  8:52 AM 0.3 mL 05/18/2019 Intramuscular   Manufacturer: [employer], [employer]   Lot: [id number]   NDC: [address]-3

## 2019-09-05 ENCOUNTER — Ambulatory Visit (INDEPENDENT_AMBULATORY_CARE_PROVIDER_SITE_OTHER): Payer: BC Managed Care – PPO | Admitting: Family Medicine

## 2019-09-05 ENCOUNTER — Other Ambulatory Visit: Payer: Self-pay

## 2019-09-05 ENCOUNTER — Encounter (INDEPENDENT_AMBULATORY_CARE_PROVIDER_SITE_OTHER): Payer: Self-pay | Admitting: Family Medicine

## 2019-09-05 VITALS — BP 113/77 | HR 83 | Temp 99.0°F | Ht 65.0 in | Wt 269.0 lb

## 2019-09-05 DIAGNOSIS — R7303 Prediabetes: Secondary | ICD-10-CM | POA: Diagnosis not present

## 2019-09-05 DIAGNOSIS — D508 Other iron deficiency anemias: Secondary | ICD-10-CM | POA: Diagnosis not present

## 2019-09-05 DIAGNOSIS — E559 Vitamin D deficiency, unspecified: Secondary | ICD-10-CM | POA: Diagnosis not present

## 2019-09-05 DIAGNOSIS — Z9189 Other specified personal risk factors, not elsewhere classified: Secondary | ICD-10-CM | POA: Diagnosis not present

## 2019-09-05 DIAGNOSIS — Z6841 Body Mass Index (BMI) 40.0 and over, adult: Secondary | ICD-10-CM

## 2019-09-06 MED ORDER — METFORMIN HCL 500 MG PO TABS: 500.0000 mg | ORAL_TABLET | Freq: Every day | ORAL | 0 refills | Status: DC

## 2019-09-06 NOTE — Progress Notes (Signed)
Chief Complaint:   OBESITY Lauren Powell is here to discuss her progress with her obesity treatment plan along with follow-up of her obesity related diagnoses. Lauren Powell is on the Category 3 Plan and states she is following her eating plan approximately 80% of the time. Lauren Powell states she is walking for 15 minutes 3 times per week.  Today's visit was #: 3 Starting weight: 276 lbs Starting date: 08/07/2019 Today's weight: 269 lbs Today's date: 09/05/2019 Total lbs lost to date: 7 lbs Total lbs lost since last in-office visit: 4 lbs  Interim History: Lauren Powell reports that she recently celebrated her son's birthday by eating cake and [employer].  She says she is currently happy with the plan.  Subjective:   1. Prediabetes Lauren Powell has a diagnosis of prediabetes based on her elevated HgA1c and was informed this puts her at greater risk of developing diabetes. She continues to work on diet and exercise to decrease her risk of diabetes. She denies nausea or hypoglycemia.  Lab Results  Component Value Date   HGBA1C 5.6 08/07/2019   Lab Results  Component Value Date   INSULIN 13.2 08/07/2019   2. Other iron deficiency anemia Lauren Powell is not a vegetarian.  She does not have a history of weight loss surgery.   CBC Latest Ref Rng & Units 08/07/2019 08/06/2019 12/21/2018  WBC 3.4 - 10.8 x10E3/uL 16.1(H) 8.7 6.9  Hemoglobin 11.1 - 15.9 g/dL 11.0(L) 11.9(L) 12.3  Hematocrit 34.0 - 46.6 % 34.2 39.0 38.1  Platelets 150 - 450 x10E3/uL 301 295 293.0   Lab Results  Component Value Date   IRON 43 08/07/2019   TIBC 316 08/07/2019   FERRITIN 25 08/07/2019   Lab Results  Component Value Date   VITAMINB12 647 08/07/2019   3. Vitamin D deficiency Lauren Powell's Vitamin D level was 15.6 on 08/07/2019. She is currently taking prescription vitamin D 50,000 IU each week. She denies nausea, vomiting or muscle weakness.  Assessment/Plan:   1. Prediabetes Lauren Powell will continue to work on weight loss, exercise, and decreasing simple  carbohydrates to help decrease the risk of diabetes.   Orders - metFORMIN (GLUCOPHAGE) 500 MG tablet; Take 1 tablet (500 mg total) by mouth daily.  Dispense: 30 tablet; Refill: 0  2. Other iron deficiency anemia Orders and follow up as documented in patient record.  Counseling . Iron is essential for our bodies to make red blood cells.  Reasons that someone may be deficient include: an iron-deficient diet (more likely in those following vegan or vegetarian diets), women with heavy menses, patients with GI disorders or poor absorption, patients that have had bariatric surgery, frequent blood donors, patients with cancer, and patients with heart disease.   Lauren Powell foods include dark leafy greens, red and white meats, eggs, seafood, and beans.   . Certain foods and drinks prevent your body from absorbing iron properly. Avoid eating these foods in the same meal as iron-rich foods or with iron supplements. These foods include: coffee, black tea, and red wine; milk, dairy products, and foods that are high in calcium; beans and soybeans; whole grains.  . Constipation can be a side effect of iron supplementation. Increased water and fiber intake are helpful. Water goal: > 2 liters/day. Fiber goal: > 25 grams/day.  3. Vitamin D deficiency Low Vitamin D level contributes to fatigue and are associated with obesity, breast, and colon cancer. She agrees to continue to take prescription Vitamin D @50 ,000 IU every week and will follow-up for routine testing  of Vitamin D, at least 2-3 times per year to avoid over-replacement.  4. At risk for diabetes mellitus Lauren Powell was given approximately 15 minutes of diabetes education and counseling today. We discussed intensive lifestyle modifications today with an emphasis on weight loss as well as increasing exercise and decreasing simple carbohydrates in her diet. We also reviewed medication options with an emphasis on risk versus benefit of those discussed.    Repetitive spaced learning was employed today to elicit superior memory formation and behavioral change.  5. Class 3 severe obesity with serious comorbidity and body mass index (BMI) of 40.0 to 44.9 in adult, unspecified obesity type (HCC) Lauren Powell is currently in the action stage of change. As such, her goal is to continue with weight loss efforts. She has agreed to the Category 3 Plan.   Exercise goals: HIIT walking.  Behavioral modification strategies: increasing lean protein intake and increasing water intake.  Lauren Powell has agreed to follow-up with our clinic in 2 weeks. She was informed of the importance of frequent follow-up visits to maximize her success with intensive lifestyle modifications for her multiple health conditions.   Objective:   Blood pressure 113/77, pulse 83, temperature 99 F (37.2 C), temperature source Oral, height 5\' 5"  (1.651 m), weight 269 lb (122 kg), last menstrual period 08/13/2019, SpO2 100 %. Body mass index is 44.76 kg/m.  General: Cooperative, alert, well developed, in no acute distress. HEENT: Conjunctivae and lids unremarkable. Cardiovascular: Regular rhythm.  Lungs: Normal work of breathing. Neurologic: No focal deficits.   Lab Results  Component Value Date   CREATININE 0.80 08/07/2019   BUN 8 08/07/2019   NA 140 08/07/2019   K 3.8 08/07/2019   CL 103 08/07/2019   CO2 24 08/07/2019   Lab Results  Component Value Date   ALT 8 08/07/2019   AST 11 08/07/2019   ALKPHOS 78 08/07/2019   BILITOT 0.3 08/07/2019   Lab Results  Component Value Date   HGBA1C 5.6 08/07/2019   HGBA1C 5.7 12/21/2018   HGBA1C 5.8 12/15/2017   HGBA1C 5.8 12/29/2016   HGBA1C 6.0 05/29/2015   Lab Results  Component Value Date   INSULIN 13.2 08/07/2019   Lab Results  Component Value Date   TSH 0.974 08/07/2019   Lab Results  Component Value Date   CHOL 158 08/07/2019   HDL 63 08/07/2019   LDLCALC 83 08/07/2019   TRIG 59 08/07/2019   CHOLHDL 2.5  08/07/2019   Lab Results  Component Value Date   WBC 16.1 (H) 08/07/2019   HGB 11.0 (L) 08/07/2019   HCT 34.2 08/07/2019   MCV 78 (L) 08/07/2019   PLT 301 08/07/2019   Lab Results  Component Value Date   IRON 43 08/07/2019   TIBC 316 08/07/2019   FERRITIN 25 08/07/2019   Attestation Statements:   Reviewed by clinician on day of visit: allergies, medications, problem list, medical history, surgical history, family history, social history, and previous encounter notes.  I, 10/07/2019, CMA, am acting as Insurance claims handler for Energy manager, DO.  I have reviewed the above documentation for accuracy and completeness, and I agree with the above. [employer], DO

## 2019-09-24 ENCOUNTER — Encounter: Payer: Self-pay | Admitting: Family Medicine

## 2019-09-24 ENCOUNTER — Telehealth (INDEPENDENT_AMBULATORY_CARE_PROVIDER_SITE_OTHER): Payer: BC Managed Care – PPO | Admitting: Family Medicine

## 2019-09-24 VITALS — Ht 65.0 in

## 2019-09-24 DIAGNOSIS — J309 Allergic rhinitis, unspecified: Secondary | ICD-10-CM

## 2019-09-24 MED ORDER — PREDNISONE 20 MG PO TABS: 40.0000 mg | ORAL_TABLET | Freq: Every day | ORAL | 0 refills | Status: AC

## 2019-09-24 MED ORDER — MONTELUKAST SODIUM 10 MG PO TABS: 10.0000 mg | ORAL_TABLET | Freq: Every day | ORAL | 1 refills | Status: DC

## 2019-09-24 NOTE — Progress Notes (Signed)
Virtual Visit via Video Note   I connected with [name] on 09/24/19 by a video enabled telemedicine application and verified that I am speaking with the correct person using two identifiers.  Location patient: home Location provider:work office Persons participating in the virtual visit: patient, provider  I discussed the limitations of evaluation and management by telemedicine and the availability of in person appointments. The patient expressed understanding and agreed to proceed.  Chief Complaint  Patient presents with  . Allergies    possible allergy shot    HPI: [name] is a 34 yo female with hx of eczema,vit D deficiency,and allergies c/o worsening nasal congestion,rhinorrhea,eye and nose pruritus,and sneezing. Problem is affecting her job. She has not identified exacerbating or alleviating factors.  She denies fever, chills, sore throat, cough, wheezing, dyspnea, abdominal pain, nausea, vomiting, changes in bowel habits, arthritis, or skin rash. She has tried different medications, including Nasonex. Currently she is taking Benadryl, which is not helping. According to patient, she has tried different antihistaminics but they do not work.  Negative for sick contacts. No anosmia or ageusia.  ROS: See pertinent positives and negatives per HPI.  Past Medical History:  Diagnosis Date  . Allergy   . Eczema   . Female infertility   . History of anemia   . Obesity   . Vitamin D deficiency     Past Surgical History:  Procedure Laterality Date  . CESAREAN SECTION    . DILATATION & CURETTAGE/HYSTEROSCOPY WITH MYOSURE N/A 08/06/2019   Procedure: [place];  Surgeon: Servando Salina, MD;  Location: [facility];  Service: Gynecology;  Laterality: N/A;  . TONSILLECTOMY  04/30/2019  . TOOTH EXTRACTION N/A     Family History  Problem Relation Age of Onset  . Alcohol abuse Mother   . Drug abuse Mother   . Bipolar  disorder Mother   . Depression Mother   . Hypertension Paternal Aunt   . Hypertension Paternal Grandmother   . Prostate cancer Paternal Grandfather     Social History   Socioeconomic History  . Marital status: Divorced    Spouse name: Not on file  . Number of children: Not on file  . Years of education: Not on file  . Highest education level: Not on file  Occupational History  . Occupation: Oncologist  Tobacco Use  . Smoking status: Never Smoker  . Smokeless tobacco: Never Used  Substance and Sexual Activity  . Alcohol use: No  . Drug use: No  . Sexual activity: Yes    Partners: Male    Birth control/protection: None  Other Topics Concern  . Not on file  Social History Narrative   Patient lives with son   Teaches pre school and kindergarten.    Social Determinants of Health   Financial Resource Strain:   . Difficulty of Paying Living Expenses:   Food Insecurity:   . Worried About Charity fundraiser in the Last Year:   . Arboriculturist in the Last Year:   Transportation Needs:   . Film/video editor (Medical):   Marland Kitchen Lack of Transportation (Non-Medical):   Physical Activity:   . Days of Exercise per Week:   . Minutes of Exercise per Session:   Stress:   . Feeling of Stress :   Social Connections:   . Frequency of Communication with Friends and Family:   . Frequency of Social Gatherings with Friends and Family:   . Attends  Religious Services:   . Active Member of Clubs or Organizations:   . Attends Banker Meetings:   Marland Kitchen Marital Status:   Intimate Partner Violence:   . Fear of Current or Ex-Partner:   . Emotionally Abused:   Marland Kitchen Physically Abused:   . Sexually Abused:     Current Outpatient Medications:  .  metFORMIN (GLUCOPHAGE) 500 MG tablet, Take 1 tablet (500 mg total) by mouth daily., Disp: 30 tablet, Rfl: 0 .  Olopatadine HCl 0.2 % SOLN, INSTILL 1 DROP INTO AFFECTED EYE EVERY DAY, Disp: , Rfl:  .  Vitamin D, Ergocalciferol,  (DRISDOL) 1.25 MG (50000 UNIT) CAPS capsule, Take 1 capsule (50,000 Units total) by mouth every 7 (seven) days., Disp: 4 capsule, Rfl: 0 .  montelukast (SINGULAIR) 10 MG tablet, Take 1 tablet (10 mg total) by mouth at bedtime., Disp: 30 tablet, Rfl: 1 .  predniSONE (DELTASONE) 20 MG tablet, Take 2 tablets (40 mg total) by mouth daily with breakfast for 5 days., Disp: 10 tablet, Rfl: 0  EXAM:  VITALS per patient if applicable:Ht 5\' 5"  (1.651 m)   BMI 44.76 kg/m   GENERAL: alert, oriented, appears well and in no acute distress  HEENT: atraumatic, conjunttiva clear, no obvious abnormalities on inspection of external nose and ears  NECK: normal movements of the head and neck  LUNGS: on inspection no signs of respiratory distress, breathing rate appears normal, no obvious gross SOB, gasping or wheezing  CV: no obvious cyanosis  MS: moves all visible extremities without noticeable abnormality  PSYCH/NEURO: pleasant and cooperative, no obvious depression, + anxious.Speech and thought processing grossly intact  ASSESSMENT AND PLAN:  Discussed the following assessment and plan:  Allergic rhinitis, unspecified seasonality, unspecified trigger Chronic problem with exacerbation. Skeptical about oral treatment efficacy. After discussion of some side effects, she agrees with trying prednisone. Nasal irrigation with saline as needed. Zyrtec 10 mg in the morning and Singulair 10 mg at bedtime. Follow-up with PCP in 6 weeks.   I discussed the assessment and treatment plan with the patient. Ms was provided an opportunity to ask questions and all were answered. She agreed with the plan and demonstrated an understanding of the instructions.   Return in about 6 weeks (around 11/05/2019) for allergic rhinitis with PCP.    Fiona Coto 11/07/2019, MD

## 2019-09-25 ENCOUNTER — Telehealth (INDEPENDENT_AMBULATORY_CARE_PROVIDER_SITE_OTHER): Payer: BC Managed Care – PPO | Admitting: Adult Health

## 2019-09-25 ENCOUNTER — Other Ambulatory Visit: Payer: Self-pay

## 2019-09-25 ENCOUNTER — Encounter: Payer: Self-pay | Admitting: Adult Health

## 2019-09-25 VITALS — Temp 97.1°F | Ht 65.0 in | Wt 269.0 lb

## 2019-09-25 DIAGNOSIS — J309 Allergic rhinitis, unspecified: Secondary | ICD-10-CM

## 2019-09-25 MED ORDER — AZELASTINE HCL 0.1 % NA SOLN: 2.0000 | Freq: Two times a day (BID) | NASAL | 3 refills | Status: DC

## 2019-09-25 NOTE — Progress Notes (Signed)
Virtual Visit via Video Note  I connected with Lauren Powell on 09/25/19 at 11:00 AM EDT by a video enabled telemedicine application and verified that I am speaking with the correct person using two identifiers.  Location patient: home Location provider:work or home office Persons participating in the virtual visit: patient, provider  I discussed the limitations of evaluation and management by telemedicine and the availability of in person appointments. The patient expressed understanding and agreed to proceed.   HPI: She was seen yesterday via video visit by another provider in the office for worsening allergy symptoms.  Her symptoms included worsening nasal congestion, rhinorrhea, eye and nose pruritus, and sneezing.  She denied fevers, chills, sore throat, cough, wheezing, or dyspnea.  She has tried many over-the-counter allergy medications including Claritin, Zyrtec, and Flonase which had no effect on her seasonal allergies.  Today which she was prescribed Singulair and prednisone taper.  She reports that in the last 24 hours she has had no improvement in her symptoms.  She continues to have a lot of nasal congestion which makes it difficult to do her job wearing a mask.  ROS: See pertinent positives and negatives per HPI.  Past Medical History:  Diagnosis Date  . Allergy   . Eczema   . Female infertility   . History of anemia   . Obesity   . Vitamin D deficiency     Past Surgical History:  Procedure Laterality Date  . CESAREAN SECTION    . DILATATION & CURETTAGE/HYSTEROSCOPY WITH MYOSURE N/A 08/06/2019   Procedure: DILATATION & CURETTAGE/HYSTEROSCOPY WITH MYOSURE;  Surgeon: Maxie Better, MD;  Location: [facility] [place];  Service: Gynecology;  Laterality: N/A;  . TONSILLECTOMY  04/30/2019  . TOOTH EXTRACTION N/A     Family History  Problem Relation Age of Onset  . Alcohol abuse Mother   . Drug abuse Mother   . Bipolar disorder Mother   . Depression  Mother   . Hypertension Paternal Aunt   . Hypertension Paternal Grandmother   . Prostate cancer Paternal Grandfather        Current Outpatient Medications:  .  metFORMIN (GLUCOPHAGE) 500 MG tablet, Take 1 tablet (500 mg total) by mouth daily., Disp: 30 tablet, Rfl: 0 .  montelukast (SINGULAIR) 10 MG tablet, Take 1 tablet (10 mg total) by mouth at bedtime., Disp: 30 tablet, Rfl: 1 .  Olopatadine HCl 0.2 % SOLN, INSTILL 1 DROP INTO AFFECTED EYE EVERY DAY, Disp: , Rfl:  .  Vitamin D, Ergocalciferol, (DRISDOL) 1.25 MG (50000 UNIT) CAPS capsule, Take 1 capsule (50,000 Units total) by mouth every 7 (seven) days., Disp: 4 capsule, Rfl: 0 .  predniSONE (DELTASONE) 20 MG tablet, Take 2 tablets (40 mg total) by mouth daily with breakfast for 5 days., Disp: 10 tablet, Rfl: 0  EXAM:  VITALS per patient if applicable:  GENERAL: alert, oriented, appears well and in no acute distress  HEENT: atraumatic, conjunttiva clear, no obvious abnormalities on inspection of external nose and ears  NECK: normal movements of the head and neck  LUNGS: on inspection no signs of respiratory distress, breathing rate appears normal, no obvious gross SOB, gasping or wheezing  CV: no obvious cyanosis  MS: moves all visible extremities without noticeable abnormality  PSYCH/NEURO: pleasant and cooperative, no obvious depression or anxiety, speech and thought processing grossly intact  ASSESSMENT AND PLAN:  Discussed the following assessment and plan:  1. Allergic rhinitis, unspecified seasonality, unspecified trigger -Encouraged her to continue with current therapy.  Singulair can take a few weeks to fully work and she should start to notice some benefit with the prednisone in the next couple of days.  We will also send in Astelin nasal spray to help with this nasal congestion - azelastine (ASTELIN) 0.1 % nasal spray; Place 2 sprays into both nostrils 2 (two) times daily. Use in each nostril as directed   Dispense: 30 mL; Refill: 3 -Follow-up as needed     I discussed the assessment and treatment plan with the patient. The patient was provided an opportunity to ask questions and all were answered. The patient agreed with the plan and demonstrated an understanding of the instructions.   The patient was advised to call back or seek an in-person evaluation if the symptoms worsen or if the condition fails to improve as anticipated.   Dorothyann Peng, NP

## 2019-09-29 ENCOUNTER — Other Ambulatory Visit (INDEPENDENT_AMBULATORY_CARE_PROVIDER_SITE_OTHER): Payer: Self-pay | Admitting: Family Medicine

## 2019-09-29 DIAGNOSIS — E559 Vitamin D deficiency, unspecified: Secondary | ICD-10-CM

## 2019-10-02 ENCOUNTER — Other Ambulatory Visit (INDEPENDENT_AMBULATORY_CARE_PROVIDER_SITE_OTHER): Payer: Self-pay | Admitting: Family Medicine

## 2019-10-02 DIAGNOSIS — R7303 Prediabetes: Secondary | ICD-10-CM

## 2019-10-03 ENCOUNTER — Ambulatory Visit (INDEPENDENT_AMBULATORY_CARE_PROVIDER_SITE_OTHER): Payer: BC Managed Care – PPO | Admitting: Family Medicine

## 2019-10-03 ENCOUNTER — Encounter (INDEPENDENT_AMBULATORY_CARE_PROVIDER_SITE_OTHER): Payer: Self-pay | Admitting: Family Medicine

## 2019-10-03 ENCOUNTER — Other Ambulatory Visit: Payer: Self-pay

## 2019-10-03 VITALS — BP 119/80 | HR 94 | Temp 98.1°F | Ht 65.0 in | Wt 269.0 lb

## 2019-10-03 DIAGNOSIS — Z6841 Body Mass Index (BMI) 40.0 and over, adult: Secondary | ICD-10-CM

## 2019-10-03 DIAGNOSIS — E559 Vitamin D deficiency, unspecified: Secondary | ICD-10-CM

## 2019-10-03 DIAGNOSIS — R7303 Prediabetes: Secondary | ICD-10-CM | POA: Diagnosis not present

## 2019-10-03 DIAGNOSIS — Z9189 Other specified personal risk factors, not elsewhere classified: Secondary | ICD-10-CM | POA: Diagnosis not present

## 2019-10-03 MED ORDER — METFORMIN HCL 500 MG PO TABS: 500.0000 mg | ORAL_TABLET | Freq: Every day | ORAL | 0 refills | Status: DC

## 2019-10-03 NOTE — Progress Notes (Signed)
Chief Complaint:   OBESITY Lauren Powell is here to discuss her progress with her obesity treatment plan along with follow-up of her obesity related diagnoses. Lauren Powell is on the Category 3 Plan and states she is following her eating plan approximately 80% of the time. Lauren Powell states she is walking for 30 minutes 3 times per week.  Today's visit was #: 4 Starting weight: 276 lbs Starting date: 08/07/2019 Today's weight: 269 lbs Today's date: 10/03/2019 Total lbs lost to date: 7 lbs Total lbs lost since last in-office visit: 0  Interim History: Lauren Powell has had no weight change, but feels that this is a win.  She says she has not been hungry, so she has not been eating as much.  For breakfast she will have an egg and sausage, and then she skips either lunch or dinner.  Subjective:   1. Vitamin D deficiency Lauren Powell's Vitamin D level was 15.6 on 08/07/2019. She is currently taking prescription vitamin D 50,000 IU each week. She denies nausea, vomiting or muscle weakness.  2. Prediabetes Lauren Powell has a diagnosis of prediabetes based on her elevated HgA1c and was informed this puts her at greater risk of developing diabetes. She continues to work on diet and exercise to decrease her risk of diabetes. She denies nausea or hypoglycemia.  She is taking metformin 500 mg daily.  Lab Results  Component Value Date   HGBA1C 5.6 08/07/2019   Lab Results  Component Value Date   INSULIN 13.2 08/07/2019   Assessment/Plan:   1. Vitamin D deficiency Low Vitamin D level contributes to fatigue and are associated with obesity, breast, and colon cancer. She agrees to continue to take prescription Vitamin D @50 ,000 IU every week and will follow-up for routine testing of Vitamin D, at least 2-3 times per year to avoid over-replacement.  2. Prediabetes [name] will continue to work on weight loss, exercise, and decreasing simple carbohydrates to help decrease the risk of diabetes.   Orders - metFORMIN (GLUCOPHAGE) 500 MG  tablet; Take 1 tablet (500 mg total) by mouth daily.  Dispense: 30 tablet; Refill: 0  3. At risk for deficient intake of food Lauren Powell was given approximately 15 minutes of deficit intake of food prevention counseling today. Lauren Powell is at risk for eating too few calories based on current food recall. She was encouraged to focus on meeting caloric and protein goals according to her recommended meal plan.   4. Class 3 severe obesity with serious comorbidity and body mass index (BMI) of 40.0 to 44.9 in adult, unspecified obesity type (HCC) Tinea is currently in the action stage of change. As such, her goal is to continue with weight loss efforts. She has agreed to the Category 3 Plan.   Protein drinks are okay if no better options.  Exercise goals: Add strength training twice weekly.  Behavioral modification strategies: increasing lean protein intake, decreasing simple carbohydrates and increasing vegetables.  Lauren Powell has agreed to follow-up with our clinic in 2 weeks. She was informed of the importance of frequent follow-up visits to maximize her success with intensive lifestyle modifications for her multiple health conditions.   Objective:   Blood pressure 119/80, pulse 94, temperature 98.1 F (36.7 C), temperature source Oral, height 5\' 5"  (1.651 m), weight 269 lb (122 kg), last menstrual period 09/11/2019, SpO2 97 %. Body mass index is 44.76 kg/m.  General: Cooperative, alert, well developed, in no acute distress. HEENT: Conjunctivae and lids unremarkable. Cardiovascular: Regular rhythm.  Lungs: Normal work of breathing.  Neurologic: No focal deficits.   Lab Results  Component Value Date   CREATININE 0.80 08/07/2019   BUN 8 08/07/2019   NA 140 08/07/2019   K 3.8 08/07/2019   CL 103 08/07/2019   CO2 24 08/07/2019   Lab Results  Component Value Date   ALT 8 08/07/2019   AST 11 08/07/2019   ALKPHOS 78 08/07/2019   BILITOT 0.3 08/07/2019   Lab Results  Component Value Date    HGBA1C 5.6 08/07/2019   HGBA1C 5.7 12/21/2018   HGBA1C 5.8 12/15/2017   HGBA1C 5.8 12/29/2016   HGBA1C 6.0 05/29/2015   Lab Results  Component Value Date   INSULIN 13.2 08/07/2019   Lab Results  Component Value Date   TSH 0.974 08/07/2019   Lab Results  Component Value Date   CHOL 158 08/07/2019   HDL 63 08/07/2019   LDLCALC 83 08/07/2019   TRIG 59 08/07/2019   CHOLHDL 2.5 08/07/2019   Lab Results  Component Value Date   WBC 16.1 (H) 08/07/2019   HGB 11.0 (L) 08/07/2019   HCT 34.2 08/07/2019   MCV 78 (L) 08/07/2019   PLT 301 08/07/2019   Lab Results  Component Value Date   IRON 43 08/07/2019   TIBC 316 08/07/2019   FERRITIN 25 08/07/2019   Attestation Statements:   Reviewed by clinician on day of visit: allergies, medications, problem list, medical history, surgical history, family history, social history, and previous encounter notes.  I, Insurance claims handler, CMA, am acting as Energy manager for [employer], DO.  I have reviewed the above documentation for accuracy and completeness, and I agree with the above. Helane Rima, DO

## 2019-10-24 ENCOUNTER — Ambulatory Visit (INDEPENDENT_AMBULATORY_CARE_PROVIDER_SITE_OTHER): Payer: BC Managed Care – PPO | Admitting: Family Medicine

## 2019-11-02 ENCOUNTER — Other Ambulatory Visit (INDEPENDENT_AMBULATORY_CARE_PROVIDER_SITE_OTHER): Payer: Self-pay | Admitting: Family Medicine

## 2019-11-02 DIAGNOSIS — R7303 Prediabetes: Secondary | ICD-10-CM

## 2019-11-15 ENCOUNTER — Ambulatory Visit (INDEPENDENT_AMBULATORY_CARE_PROVIDER_SITE_OTHER): Payer: BC Managed Care – PPO | Admitting: Physician Assistant

## 2019-11-15 ENCOUNTER — Encounter (INDEPENDENT_AMBULATORY_CARE_PROVIDER_SITE_OTHER): Payer: Self-pay | Admitting: Physician Assistant

## 2019-11-15 ENCOUNTER — Other Ambulatory Visit: Payer: Self-pay

## 2019-11-15 VITALS — BP 121/81 | HR 73 | Temp 98.0°F | Ht 65.0 in | Wt 275.0 lb

## 2019-11-15 DIAGNOSIS — Z9189 Other specified personal risk factors, not elsewhere classified: Secondary | ICD-10-CM | POA: Diagnosis not present

## 2019-11-15 DIAGNOSIS — E559 Vitamin D deficiency, unspecified: Secondary | ICD-10-CM

## 2019-11-15 DIAGNOSIS — R7303 Prediabetes: Secondary | ICD-10-CM

## 2019-11-15 DIAGNOSIS — Z6841 Body Mass Index (BMI) 40.0 and over, adult: Secondary | ICD-10-CM

## 2019-11-15 MED ORDER — VITAMIN D (ERGOCALCIFEROL) 1.25 MG (50000 UNIT) PO CAPS: 50000.0000 [IU] | ORAL_CAPSULE | ORAL | 0 refills | Status: DC

## 2019-11-15 NOTE — Progress Notes (Signed)
Chief Complaint:   OBESITY Lauren Powell is here to discuss her progress with her obesity treatment plan along with follow-up of her obesity related diagnoses. Lauren Powell is on the Category 3 Plan and states she is following her eating plan approximately 80% of the time. Lauren Powell states she is doing [employer] 45 minutes 5 times per week.  Today's visit was #: 5 Starting weight: 276 lbs Starting date: 08/07/2019 Today's weight: 275 lbs Today's date: 11/15/2019 Total lbs lost to date: 1 Total lbs lost since last in-office visit: 0  Interim History: Lauren Powell states that she was stressed with the end of school and she was skipping meals due to a decreased appetite. She substituted a protein shake at times for lunch. She is off for the summer and is ready to get back on track.  Subjective:   Vitamin D deficiency. No nausea, vomiting, or muscle weakness on prescription Vitamin D. Last Vitamin D was 15.6 on 08/07/2019.  Prediabetes. Lauren Powell has a diagnosis of prediabetes based on her elevated HgA1c and was informed this puts her at greater risk of developing diabetes. She continues to work on diet and exercise to decrease her risk of diabetes. She denies nausea or hypoglycemia. Lauren Powell is on metformin. No nausea, vomiting, diarrhea, or polyphagia.   Lab Results  Component Value Date   HGBA1C 5.6 08/07/2019   Lab Results  Component Value Date   INSULIN 13.2 08/07/2019   At risk for diabetes mellitus. Lauren Powell is at higher than average risk for developing diabetes due to her obesity.   Assessment/Plan:   Vitamin D deficiency. Low Vitamin D level contributes to fatigue and are associated with obesity, breast, and colon cancer. She was given a refill on her Vitamin D, Ergocalciferol, (DRISDOL) 1.25 MG (50000 UNIT) CAPS capsule every week #4 with 0 refills and will follow-up for routine testing of Vitamin D, at least 2-3 times per year to avoid over-replacement.   Prediabetes. Lauren Powell will  continue to work on weight loss, exercise, and decreasing simple carbohydrates to help decrease the risk of diabetes. She will continue with metformin as directed.  At risk for diabetes mellitus. Lauren Powell was given approximately 15 minutes of diabetes education and counseling today. We discussed intensive lifestyle modifications today with an emphasis on weight loss as well as increasing exercise and decreasing simple carbohydrates in her diet. We also reviewed medication options with an emphasis on risk versus benefit of those discussed.   Repetitive spaced learning was employed today to elicit superior memory formation and behavioral change.  Class 3 severe obesity with serious comorbidity and body mass index (BMI) of 45.0 to 49.9 in adult, unspecified obesity type (HCC).  Lauren Powell is currently in the action stage of change. As such, her goal is to continue with weight loss efforts. She has agreed to the Category 3 Plan.   Exercise goals: For substantial health benefits, adults should do at least 150 minutes (2 hours and 30 minutes) a week of moderate-intensity, or 75 minutes (1 hour and 15 minutes) a week of vigorous-intensity aerobic physical activity, or an equivalent combination of moderate- and vigorous-intensity aerobic activity. Aerobic activity should be performed in episodes of at least 10 minutes, and preferably, it should be spread throughout the week.  Behavioral modification strategies: meal planning and cooking strategies and keeping healthy foods in the home.  Lauren Powell has agreed to follow-up with our clinic in 2-3 weeks. She was informed of the importance of frequent follow-up visits to  maximize her success with intensive lifestyle modifications for her multiple health conditions.   Objective:   Blood pressure 121/81, pulse 73, temperature 98 F (36.7 C), temperature source Oral, height 5\' 5"  (1.651 m), weight 275 lb (124.7 kg), SpO2 100 %. Body mass index is 45.76 kg/m.  General:  Cooperative, alert, well developed, in no acute distress. HEENT: Conjunctivae and lids unremarkable. Cardiovascular: Regular rhythm.  Lungs: Normal work of breathing. Neurologic: No focal deficits.   Lab Results  Component Value Date   CREATININE 0.80 08/07/2019   BUN 8 08/07/2019   NA 140 08/07/2019   K 3.8 08/07/2019   CL 103 08/07/2019   CO2 24 08/07/2019   Lab Results  Component Value Date   ALT 8 08/07/2019   AST 11 08/07/2019   ALKPHOS 78 08/07/2019   BILITOT 0.3 08/07/2019   Lab Results  Component Value Date   HGBA1C 5.6 08/07/2019   HGBA1C 5.7 12/21/2018   HGBA1C 5.8 12/15/2017   HGBA1C 5.8 12/29/2016   HGBA1C 6.0 05/29/2015   Lab Results  Component Value Date   INSULIN 13.2 08/07/2019   Lab Results  Component Value Date   TSH 0.974 08/07/2019   Lab Results  Component Value Date   CHOL 158 08/07/2019   HDL 63 08/07/2019   LDLCALC 83 08/07/2019   TRIG 59 08/07/2019   CHOLHDL 2.5 08/07/2019   Lab Results  Component Value Date   WBC 16.1 (H) 08/07/2019   HGB 11.0 (L) 08/07/2019   HCT 34.2 08/07/2019   MCV 78 (L) 08/07/2019   PLT 301 08/07/2019   Lab Results  Component Value Date   IRON 43 08/07/2019   TIBC 316 08/07/2019   FERRITIN 25 08/07/2019   Attestation Statements:   Reviewed by clinician on day of visit: allergies, medications, problem list, medical history, surgical history, family history, social history, and previous encounter notes.  IMichaelene Song, am acting as transcriptionist for Abby Potash, PA-C   I have reviewed the above documentation for accuracy and completeness, and I agree with the above. Abby Potash, PA-C

## 2019-11-22 ENCOUNTER — Encounter (INDEPENDENT_AMBULATORY_CARE_PROVIDER_SITE_OTHER): Payer: Self-pay | Admitting: Family Medicine

## 2019-11-22 DIAGNOSIS — M25569 Pain in unspecified knee: Secondary | ICD-10-CM

## 2019-11-22 NOTE — Telephone Encounter (Signed)
Please advise 

## 2019-11-28 ENCOUNTER — Other Ambulatory Visit: Payer: Self-pay

## 2019-11-28 ENCOUNTER — Ambulatory Visit: Payer: BC Managed Care – PPO | Admitting: Podiatry

## 2019-11-28 ENCOUNTER — Ambulatory Visit (INDEPENDENT_AMBULATORY_CARE_PROVIDER_SITE_OTHER): Payer: BC Managed Care – PPO

## 2019-11-28 VITALS — Temp 95.7°F

## 2019-11-28 DIAGNOSIS — M722 Plantar fascial fibromatosis: Secondary | ICD-10-CM

## 2019-11-28 DIAGNOSIS — M79672 Pain in left foot: Secondary | ICD-10-CM

## 2019-11-28 MED ORDER — MELOXICAM 15 MG PO TABS: 15.0000 mg | ORAL_TABLET | Freq: Every day | ORAL | 1 refills | Status: DC

## 2019-11-28 MED ORDER — METHYLPREDNISOLONE 4 MG PO TBPK: ORAL_TABLET | ORAL | 0 refills | Status: DC

## 2019-11-28 NOTE — Progress Notes (Signed)
   Subjective: 34 y.o. female presenting as a new patient for evaluation of left heel pain is been going on for approximately 2-3 months now.  Patient cannot recall anything that would have elicited her heel pain.  Painful in the mornings getting out of bed.   Past Medical History:  Diagnosis Date  . Allergy   . Eczema   . Female infertility   . History of anemia   . Obesity   . Vitamin D deficiency      Objective: Physical Exam General: The patient is alert and oriented x3 in no acute distress.  Dermatology: Skin is warm, dry and supple bilateral lower extremities. Negative for open lesions or macerations bilateral.   Vascular: Dorsalis Pedis and Posterior Tibial pulses palpable bilateral.  Capillary fill time is immediate to all digits.  Neurological: Epicritic and protective threshold intact bilateral.   Musculoskeletal: Tenderness to palpation to the plantar aspect of the left heel along the plantar fascia. All other joints range of motion within normal limits bilateral. Strength 5/5 in all groups bilateral.   Radiographic exam: Normal osseous mineralization. Joint spaces preserved. No fracture/dislocation/boney destruction. No other soft tissue abnormalities or radiopaque foreign bodies.   Assessment: 1. Plantar fasciitis left foot  Plan of Care:  1. Patient evaluated. Xrays reviewed.   2. Injection of 0.5cc Celestone soluspan injected into the left plantar fascia.  3. Rx for Medrol Dose Pak placed 4. Rx for Meloxicam ordered for patient. 5. Plantar fascial band(s) dispensed  6. Instructed patient regarding therapies and modalities at home to alleviate symptoms.  7. Return to clinic in 4 weeks.    *41 year old son plays football for [employer].  Also attends [employer].  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 Felecia Shelling, DPM    [address] [place], [place] [address]                Office [phone]  Fax [phone]

## 2019-12-03 ENCOUNTER — Other Ambulatory Visit: Payer: Self-pay

## 2019-12-03 ENCOUNTER — Ambulatory Visit (INDEPENDENT_AMBULATORY_CARE_PROVIDER_SITE_OTHER): Payer: BC Managed Care – PPO | Admitting: Family Medicine

## 2019-12-03 ENCOUNTER — Encounter (INDEPENDENT_AMBULATORY_CARE_PROVIDER_SITE_OTHER): Payer: Self-pay | Admitting: Family Medicine

## 2019-12-03 VITALS — BP 124/76 | HR 70 | Temp 98.1°F | Ht 65.0 in | Wt 273.0 lb

## 2019-12-03 DIAGNOSIS — M25561 Pain in right knee: Secondary | ICD-10-CM

## 2019-12-03 DIAGNOSIS — Z6841 Body Mass Index (BMI) 40.0 and over, adult: Secondary | ICD-10-CM

## 2019-12-03 DIAGNOSIS — E559 Vitamin D deficiency, unspecified: Secondary | ICD-10-CM | POA: Diagnosis not present

## 2019-12-03 MED ORDER — VITAMIN D (ERGOCALCIFEROL) 1.25 MG (50000 UNIT) PO CAPS: 50000.0000 [IU] | ORAL_CAPSULE | ORAL | 0 refills | Status: DC

## 2019-12-03 NOTE — Progress Notes (Signed)
Chief Complaint:   OBESITY Lauren Powell is here to discuss her progress with her obesity treatment plan along with follow-up of her obesity related diagnoses. Lauren Powell is on the Category 3 Plan and states she is following her eating plan approximately 80% of the time. Lauren Powell states she is doing [employer] for 45 minutes 5 times per week.  Today's visit was #: 6 Starting weight: 276 lbs Starting date: 08/07/2019 Today's weight: 273 lbs Today's date: 12/03/2019 Total lbs lost to date: 3 lbs Total lbs lost since last in-office visit: 2 lbs  Interim History: Lauren Powell has been doing [employer] 3 times per week.  She says she feels great!  She has increased energy.  She is working third shift during the summer for extra money.  She is still making healthy food choices.  Subjective:   1. Right knee pain Started after doing burpees and high knee jumps, she has pain at the inferior medial patella.  2. Vitamin D deficiency Lauren Powell's Vitamin D level was 15.6 on 08/07/2019. She is currently taking prescription vitamin D 50,000 IU each week. She denies nausea, vomiting or muscle weakness.  Assessment/Plan:   1. Right knee pain, unspecified chronicity Will follow because mobility and pain control are important for weight management.  2. Vitamin D deficiency Low Vitamin D level contributes to fatigue and are associated with obesity, breast, and colon cancer. She agrees to continue to take prescription Vitamin D @50 ,000 IU every week and will follow-up for routine testing of Vitamin D, at least 2-3 times per year to avoid over-replacement.  Orders - Vitamin D, Ergocalciferol, (DRISDOL) 1.25 MG (50000 UNIT) CAPS capsule; Take 1 capsule (50,000 Units total) by mouth every 7 (seven) days.  Dispense: 4 capsule; Refill: 0  3. Class 3 severe obesity with serious comorbidity and body mass index (BMI) of 45.0 to 49.9 in adult, unspecified obesity type (HCC) Lauren Powell is currently in the action stage of change. As  such, her goal is to continue with weight loss efforts. She has agreed to the Category 3 Plan.   Exercise goals: As is.  Behavioral modification strategies: increasing lean protein intake and increasing water intake.  Lauren Powell has agreed to follow-up with our clinic in 3 weeks. She was informed of the importance of frequent follow-up visits to maximize her success with intensive lifestyle modifications for her multiple health conditions.   Objective:   Blood pressure 124/76, pulse 70, temperature 98.1 F (36.7 C), temperature source Oral, height 5\' 5"  (1.651 m), weight 273 lb (123.8 kg), SpO2 98 %. Body mass index is 45.43 kg/m.  General: Cooperative, alert, well developed, in no acute distress. HEENT: Conjunctivae and lids unremarkable. Cardiovascular: Regular rhythm.  Lungs: Normal work of breathing. Neurologic: No focal deficits.   Lab Results  Component Value Date   CREATININE 0.80 08/07/2019   BUN 8 08/07/2019   NA 140 08/07/2019   K 3.8 08/07/2019   CL 103 08/07/2019   CO2 24 08/07/2019   Lab Results  Component Value Date   ALT 8 08/07/2019   AST 11 08/07/2019   ALKPHOS 78 08/07/2019   BILITOT 0.3 08/07/2019   Lab Results  Component Value Date   HGBA1C 5.6 08/07/2019   HGBA1C 5.7 12/21/2018   HGBA1C 5.8 12/15/2017   HGBA1C 5.8 12/29/2016   HGBA1C 6.0 05/29/2015   Lab Results  Component Value Date   INSULIN 13.2 08/07/2019   Lab Results  Component Value Date   TSH 0.974 08/07/2019  Lab Results  Component Value Date   CHOL 158 08/07/2019   HDL 63 08/07/2019   LDLCALC 83 08/07/2019   TRIG 59 08/07/2019   CHOLHDL 2.5 08/07/2019   Lab Results  Component Value Date   WBC 16.1 (H) 08/07/2019   HGB 11.0 (L) 08/07/2019   HCT 34.2 08/07/2019   MCV 78 (L) 08/07/2019   PLT 301 08/07/2019   Lab Results  Component Value Date   IRON 43 08/07/2019   TIBC 316 08/07/2019   FERRITIN 25 08/07/2019   Attestation Statements:   Reviewed by clinician on day of  visit: allergies, medications, problem list, medical history, surgical history, family history, social history, and previous encounter notes.  I, Water quality scientist, CMA, am acting as transcriptionist for Briscoe Deutscher, DO  I have reviewed the above documentation for accuracy and completeness, and I agree with the above. Briscoe Deutscher, DO

## 2019-12-04 ENCOUNTER — Ambulatory Visit: Payer: BC Managed Care – PPO | Admitting: Orthopaedic Surgery

## 2019-12-04 ENCOUNTER — Ambulatory Visit: Payer: Self-pay

## 2019-12-04 VITALS — Ht 65.0 in | Wt 273.0 lb

## 2019-12-04 DIAGNOSIS — G8929 Other chronic pain: Secondary | ICD-10-CM | POA: Diagnosis not present

## 2019-12-04 DIAGNOSIS — Z6841 Body Mass Index (BMI) 40.0 and over, adult: Secondary | ICD-10-CM | POA: Insufficient documentation

## 2019-12-04 DIAGNOSIS — M25561 Pain in right knee: Secondary | ICD-10-CM

## 2019-12-04 NOTE — Progress Notes (Signed)
Office Visit Note   Patient: Lauren Powell           Date of Birth: 1986/04/20           MRN: [id number] Visit Date: 12/04/2019              Requested by: Helane Rima, DO [address] [place],  [place] [address] PCP: Shirline Frees, NP   Assessment & Plan: Visit Diagnoses:  1. Chronic pain of right knee   2. Body mass index 45.0-49.9, adult (HCC)   3. 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  She understands that her weight contributes to knee pain she is currently working on losing weight.  She has had trouble finding over-the-counter knee brace that will fit her leg therefore we will get her a custom brace through [facility].  I have made a referral to outpatient PT for strengthening.  She will get some Voltaren gel at the pharmacy to use.  Follow-up as needed. The patient meets the AMA guidelines for Morbid (severe) obesity with a BMI > 40.0 and I have recommended weight loss.   Follow-Up Instructions: Return if symptoms worsen or fail to improve.   Orders:  Orders Placed This Encounter  Procedures  . XR KNEE 3 VIEW RIGHT  . Ambulatory referral to Physical Therapy   No orders of the defined types were placed in this encounter.     Procedures: No procedures performed   Clinical Data: No additional findings.   Subjective: Chief Complaint  Patient presents with  . 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 comes in for chronic right knee pain off and on for several years.  Denies any mechanical symptoms or injuries or surgeries.  He has decreased range of motion strength and increased pain with standing or sitting after a while or using stairs and when working out.   Review of Systems  Constitutional: Negative.   HENT: Negative.   Eyes: Negative.   Respiratory: Negative.   Cardiovascular:  Negative.   Endocrine: Negative.   Musculoskeletal: Negative.   Neurological: Negative.   Hematological: Negative.   Psychiatric/Behavioral: Negative.   All other systems reviewed and are negative.    Objective: Vital Signs: Ht 5\' 5"  (1.651 m)   Wt 273 lb (123.8 kg)   BMI 45.43 kg/m   Physical Exam Vitals and nursing note reviewed.  Constitutional:      Appearance: She is well-developed.  HENT:     Head: Normocephalic and atraumatic.  Pulmonary:     Effort: Pulmonary effort is normal.  Abdominal:     Palpations: Abdomen is soft.  Musculoskeletal:     Cervical back: Neck supple.  Skin:    General: Skin is warm.     Capillary Refill: Capillary refill takes less than 2 seconds.  Neurological:     Mental Status: She is alert and oriented to person, place, and time.  Psychiatric:        Behavior: Behavior normal.        Thought Content: Thought content normal.        Judgment: Judgment normal.     Ortho Exam Right knee shows no joint effusion.  Normal range of motion.  No tenderness palpation.  Collaterals and cruciates are stable. Specialty Comments:  No specialty comments available.  Imaging: XR KNEE 3 VIEW RIGHT  Result Date: 12/04/2019 No acute or structural abnormalities    PMFS History: Patient Active Problem List   Diagnosis Date Noted  . Chronic pain of right knee 12/04/2019  . Body mass index 45.0-49.9, adult (HCC) 12/04/2019  . Morbid obesity (HCC) 12/04/2019  . Hyperglycemia 10/01/2014  . Obesity 06/27/2012  . Well woman exam with routine gynecological exam 06/27/2012   Past Medical History:  Diagnosis Date  . Allergy   . Eczema   . Female infertility   . History of anemia   . Obesity   . Vitamin D deficiency     Family History  Problem Relation Age of Onset  . Alcohol abuse Mother   . Drug abuse Mother   . Bipolar disorder Mother   . Depression Mother   . Hypertension Paternal Aunt   . Hypertension Paternal Grandmother   . Prostate  cancer Paternal Grandfather     Past Surgical History:  Procedure Laterality Date  . CESAREAN SECTION    . DILATATION & CURETTAGE/HYSTEROSCOPY WITH MYOSURE N/A 08/06/2019   Procedure: DILATATION & CURETTAGE/HYSTEROSCOPY WITH MYOSURE;  Surgeon: Maxie Better, MD;  Location: [facility] ;  Service: Gynecology;  Laterality: N/A;  . TONSILLECTOMY  04/30/2019  . TOOTH EXTRACTION N/A    Social History   Occupational History  . Occupation: Midwife  Tobacco Use  . Smoking status: Never Smoker  . Smokeless tobacco: Never Used  Vaping Use  . Vaping Use: Never used  Substance and Sexual Activity  . Alcohol use: No  . Drug use: No  . Sexual activity: Yes    Partners: Male    Birth control/protection: None

## 2019-12-18 ENCOUNTER — Ambulatory Visit: Payer: BC Managed Care – PPO | Admitting: Rehabilitative and Restorative Service Providers"

## 2019-12-25 ENCOUNTER — Other Ambulatory Visit: Payer: Self-pay | Admitting: Podiatry

## 2019-12-25 DIAGNOSIS — M722 Plantar fascial fibromatosis: Secondary | ICD-10-CM

## 2019-12-31 ENCOUNTER — Ambulatory Visit (INDEPENDENT_AMBULATORY_CARE_PROVIDER_SITE_OTHER): Payer: BC Managed Care – PPO | Admitting: Family Medicine

## 2020-01-07 ENCOUNTER — Ambulatory Visit: Payer: BC Managed Care – PPO | Admitting: Podiatry

## 2020-01-16 ENCOUNTER — Other Ambulatory Visit: Payer: Self-pay

## 2020-01-16 ENCOUNTER — Ambulatory Visit (INDEPENDENT_AMBULATORY_CARE_PROVIDER_SITE_OTHER): Payer: BC Managed Care – PPO | Admitting: Family Medicine

## 2020-01-16 ENCOUNTER — Encounter (INDEPENDENT_AMBULATORY_CARE_PROVIDER_SITE_OTHER): Payer: Self-pay | Admitting: Family Medicine

## 2020-01-16 VITALS — BP 114/72 | HR 81 | Temp 98.2°F | Ht 65.0 in | Wt 280.0 lb

## 2020-01-16 DIAGNOSIS — E8881 Metabolic syndrome: Secondary | ICD-10-CM | POA: Diagnosis not present

## 2020-01-16 DIAGNOSIS — E1169 Type 2 diabetes mellitus with other specified complication: Secondary | ICD-10-CM | POA: Diagnosis not present

## 2020-01-16 DIAGNOSIS — E65 Localized adiposity: Secondary | ICD-10-CM | POA: Diagnosis not present

## 2020-01-16 DIAGNOSIS — Z9189 Other specified personal risk factors, not elsewhere classified: Secondary | ICD-10-CM | POA: Diagnosis not present

## 2020-01-16 DIAGNOSIS — E559 Vitamin D deficiency, unspecified: Secondary | ICD-10-CM | POA: Diagnosis not present

## 2020-01-16 DIAGNOSIS — Z6841 Body Mass Index (BMI) 40.0 and over, adult: Secondary | ICD-10-CM

## 2020-01-17 ENCOUNTER — Ambulatory Visit (INDEPENDENT_AMBULATORY_CARE_PROVIDER_SITE_OTHER): Payer: BC Managed Care – PPO | Admitting: Family Medicine

## 2020-01-17 MED ORDER — METFORMIN HCL 500 MG PO TABS: 500.0000 mg | ORAL_TABLET | Freq: Every day | ORAL | 0 refills | Status: DC

## 2020-01-17 MED ORDER — VITAMIN D (ERGOCALCIFEROL) 1.25 MG (50000 UNIT) PO CAPS: 50000.0000 [IU] | ORAL_CAPSULE | ORAL | 0 refills | Status: DC

## 2020-01-17 NOTE — Progress Notes (Signed)
Chief Complaint:   OBESITY Lauren Powell is here to discuss her progress with her obesity treatment plan along with follow-up of her obesity related diagnoses. Lauren Powell is on the Category 3 Plan and states she is following her eating plan approximately 50% of the time. Lauren Powell states she has increased her activity level.  Today's visit was #: 7 Starting weight: 276 lbs Starting date: 08/07/2019 Today's weight: 280 lbs Today's date: 01/16/2020 Total lbs lost to date: 0 Total lbs lost since last in-office visit: 0  Interim History: Lauren Powell has been on vacation for several weeks.  She says she is ready to get back to the plan.   Subjective:   1. Vitamin D deficiency Adelyne's Vitamin D level was 15.6 on 08/07/2019. She is currently taking prescription vitamin D 50,000 IU each week.   2. Type 2 diabetes mellitus with other specified complication, without long-term current use of insulin (HCC) Medications reviewed. Diabetic ROS: no polyuria or polydipsia, no chest pain, dyspnea or TIA's, no numbness, tingling or pain in extremities.   Lab Results  Component Value Date   HGBA1C 5.6 08/07/2019   HGBA1C 5.7 12/21/2018   HGBA1C 5.8 12/15/2017   Lab Results  Component Value Date   LDLCALC 83 08/07/2019   CREATININE 0.80 08/07/2019   Lab Results  Component Value Date   INSULIN 13.2 08/07/2019   3. Visceral obesity Lauren Powell's visceral fat rating is 16. Goal < 13.  4. Metabolic syndrome Risk factors (3 or more constitute Metabolic Syndrome): waist > 35" for female HDL < 50 for female impaired fasting blood sugar.  Assessment/Plan:   1. Vitamin D deficiency Not at goal. Optimal goal > 50 ng/dL. There is also evidence to support a goal of >70 ng/dL in patients with cancer and heart disease. Plan: Continue Vitamin D @50 ,000 IU every week with follow-up for routine testing of Vitamin D at least 2-3 times per year to avoid over-replacement.  - Vitamin D, Ergocalciferol, (DRISDOL) 1.25 MG (50000 UNIT)  CAPS capsule; Take 1 capsule (50,000 Units total) by mouth every 7 (seven) days.  Dispense: 4 capsule; Refill: 0  2. Type 2 diabetes mellitus with other specified complication, without long-term current use of insulin (HCC) The current medical regimen is effective;  continue present plan and medications.  - metFORMIN (GLUCOPHAGE) 500 MG tablet; Take 1 tablet (500 mg total) by mouth daily.  Dispense: 30 tablet; Refill: 0  3. Visceral obesity Visceral adipose tissue is a hormonally active component of total body fat. This body composition phenotype is associated with medical disorders such as metabolic syndrome, cardiovascular disease and several malignancies including prostate, breast, and colorectal cancers. Goal: Lose 7-10% of starting weight. Visceral fat rating should be < 13.  4. Metabolic syndrome Goal: Lose 7-10% of starting weight. Counseling: Intensive lifestyle modifications are the first line treatment for this issue. We discussed several lifestyle modifications today and she will continue to work on diet, exercise and weight loss efforts.   5. At risk for heart disease Lauren Powell was given approximately 15 minutes of coronary artery disease prevention counseling today. She is 34 y.o. female and has risk factors for heart disease including obesity and diabetes. We discussed intensive lifestyle modifications today with an emphasis on specific weight loss instructions and strategies.   Repetitive spaced learning was employed today to elicit superior memory formation and behavioral change.  6. Class 3 severe obesity with serious comorbidity and body mass index (BMI) of 45.0 to 49.9 in adult, unspecified obesity  type [facility]) Lauren Powell is currently in the action stage of change. As such, her goal is to continue with weight loss efforts. She has agreed to the Category 3 Plan.   Exercise goals: For substantial health benefits, adults should do at least 150 minutes (2 hours and 30 minutes) a week of  moderate-intensity, or 75 minutes (1 hour and 15 minutes) a week of vigorous-intensity aerobic physical activity, or an equivalent combination of moderate- and vigorous-intensity aerobic activity. Aerobic activity should be performed in episodes of at least 10 minutes, and preferably, it should be spread throughout the week.  Behavioral modification strategies: increasing lean protein intake.  Carrera has agreed to follow-up with our clinic in 2-3 weeks. She was informed of the importance of frequent follow-up visits to maximize her success with intensive lifestyle modifications for her multiple health conditions.   Objective:   Blood pressure 114/72, pulse 81, temperature 98.2 F (36.8 C), temperature source Oral, height 5\' 5"  (1.651 m), weight 280 lb (127 kg), SpO2 98 %. Body mass index is 46.59 kg/m.  General: Cooperative, alert, well developed, in no acute distress. HEENT: Conjunctivae and lids unremarkable. Cardiovascular: Regular rhythm.  Lungs: Normal work of breathing. Neurologic: No focal deficits.   Lab Results  Component Value Date   CREATININE 0.80 08/07/2019   BUN 8 08/07/2019   NA 140 08/07/2019   K 3.8 08/07/2019   CL 103 08/07/2019   CO2 24 08/07/2019   Lab Results  Component Value Date   ALT 8 08/07/2019   AST 11 08/07/2019   ALKPHOS 78 08/07/2019   BILITOT 0.3 08/07/2019   Lab Results  Component Value Date   HGBA1C 5.6 08/07/2019   HGBA1C 5.7 12/21/2018   HGBA1C 5.8 12/15/2017   HGBA1C 5.8 12/29/2016   HGBA1C 6.0 05/29/2015   Lab Results  Component Value Date   INSULIN 13.2 08/07/2019   Lab Results  Component Value Date   TSH 0.974 08/07/2019   Lab Results  Component Value Date   CHOL 158 08/07/2019   HDL 63 08/07/2019   LDLCALC 83 08/07/2019   TRIG 59 08/07/2019   CHOLHDL 2.5 08/07/2019   Lab Results  Component Value Date   WBC 16.1 (H) 08/07/2019   HGB 11.0 (L) 08/07/2019   HCT 34.2 08/07/2019   MCV 78 (L) 08/07/2019   PLT 301  08/07/2019   Lab Results  Component Value Date   IRON 43 08/07/2019   TIBC 316 08/07/2019   FERRITIN 25 08/07/2019   Attestation Statements:   Reviewed by clinician on day of visit: allergies, medications, problem list, medical history, surgical history, family history, social history, and previous encounter notes.  I, 10/07/2019, CMA, am acting as transcriptionist for Insurance claims handler, DO  I have reviewed the above documentation for accuracy and completeness, and I agree with the above. Helane Rima, DO

## 2020-01-25 ENCOUNTER — Encounter: Payer: BC Managed Care – PPO | Admitting: Adult Health

## 2020-01-30 ENCOUNTER — Encounter: Payer: BC Managed Care – PPO | Admitting: Adult Health

## 2020-02-05 ENCOUNTER — Encounter: Payer: Self-pay | Admitting: Adult Health

## 2020-02-05 ENCOUNTER — Encounter (INDEPENDENT_AMBULATORY_CARE_PROVIDER_SITE_OTHER): Payer: Self-pay | Admitting: Family Medicine

## 2020-02-06 ENCOUNTER — Other Ambulatory Visit (INDEPENDENT_AMBULATORY_CARE_PROVIDER_SITE_OTHER): Payer: Self-pay | Admitting: Family Medicine

## 2020-02-06 ENCOUNTER — Telehealth (INDEPENDENT_AMBULATORY_CARE_PROVIDER_SITE_OTHER): Payer: BC Managed Care – PPO | Admitting: Adult Health

## 2020-02-06 DIAGNOSIS — E1169 Type 2 diabetes mellitus with other specified complication: Secondary | ICD-10-CM

## 2020-02-06 DIAGNOSIS — E559 Vitamin D deficiency, unspecified: Secondary | ICD-10-CM

## 2020-02-06 DIAGNOSIS — F321 Major depressive disorder, single episode, moderate: Secondary | ICD-10-CM

## 2020-02-06 MED ORDER — BUPROPION HCL ER (XL) 150 MG PO TB24: 150.0000 mg | ORAL_TABLET | Freq: Every day | ORAL | 0 refills | Status: DC

## 2020-02-06 NOTE — Progress Notes (Signed)
Virtual Visit via Video Note  I connected with Lauren Powell on 02/06/20 at  3:00 PM EDT by a video enabled telemedicine application and verified that I am speaking with the correct person using two identifiers.  Location patient: home Location provider:work or home office Persons participating in the virtual visit: patient, provider  I discussed the limitations of evaluation and management by telemedicine and the availability of in person appointments. The patient expressed understanding and agreed to proceed.   HPI: 34 year old female who is being evaluated today for depression.  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  She feels as though her symptoms have gotten worse since starting school.  She noticed that about 2 weeks prior to school year starting she was having a hard time getting out of bed and did not feel as though she wanted to go to work.  Now that school is back in session she continues to feel as though she does not want to get out of bed in the morning go to work and when the school day is over she will go home and go straight to her bed.  Sometimes she goes to sleep other times she just lays there.  She is also not enjoying things that she wants did.  If you have her colleagues have noticed a change in her attitude at work.  Today was the first time she has worked out and she does report that it felt good to get back into the gym.  She is not emotionally eating.   ROS: See pertinent positives and negatives per HPI.  Past Medical History:  Diagnosis Date  . Allergy   . Eczema   . Female infertility   . History of anemia   . Obesity   . Vitamin D deficiency     Past Surgical History:  Procedure Laterality Date  . CESAREAN SECTION    . DILATATION & CURETTAGE/HYSTEROSCOPY WITH MYOSURE N/A 08/06/2019   Procedure: DILATATION & CURETTAGE/HYSTEROSCOPY WITH MYOSURE;  Surgeon:  Maxie Better, MD;  Location: [facility];  Service: Gynecology;  Laterality: N/A;  . TONSILLECTOMY  04/30/2019  . TOOTH EXTRACTION N/A     Family History  Problem Relation Age of Onset  . Alcohol abuse Mother   . Drug abuse Mother   . Bipolar disorder Mother   . Depression Mother   . Hypertension Paternal Aunt   . Hypertension Paternal Grandmother   . Prostate cancer Paternal Grandfather        Current Outpatient Medications:  .  azelastine (ASTELIN) 0.1 % nasal spray, Place 2 sprays into both nostrils 2 (two) times daily. Use in each nostril as directed, Disp: 30 mL, Rfl: 3 .  meloxicam (MOBIC) 15 MG tablet, Take 1 tablet (15 mg total) by mouth daily., Disp: 30 tablet, Rfl: 1 .  metFORMIN (GLUCOPHAGE) 500 MG tablet, Take 1 tablet (500 mg total) by mouth daily., Disp: 30 tablet, Rfl: 0 .  methylPREDNISolone (MEDROL DOSEPAK) 4 MG TBPK tablet, 6 day dose pack - take as directed, Disp: 21 tablet, Rfl: 0 .  montelukast (SINGULAIR) 10 MG tablet, Take 1 tablet (10 mg total) by mouth at bedtime., Disp: 30 tablet, Rfl: 1 .  Olopatadine HCl 0.2 % SOLN, INSTILL 1 DROP INTO AFFECTED EYE EVERY DAY, Disp: , Rfl:  .  Vitamin D, Ergocalciferol, (DRISDOL) 1.25 MG (50000 UNIT) CAPS capsule, Take 1 capsule (  50,000 Units total) by mouth every 7 (seven) days., Disp: 4 capsule, Rfl: 0  EXAM:  VITALS per patient if applicable:  GENERAL: alert, oriented, appears well and in no acute distress  HEENT: atraumatic, conjunttiva clear, no obvious abnormalities on inspection of external nose and ears  NECK: normal movements of the head and neck  LUNGS: on inspection no signs of respiratory distress, breathing rate appears normal, no obvious gross SOB, gasping or wheezing  CV: no obvious cyanosis  MS: moves all visible extremities without noticeable abnormality  PSYCH/NEURO: pleasant and cooperative, no obvious depression or anxiety, speech and thought processing grossly  intact  ASSESSMENT AND PLAN:  Discussed the following assessment and plan:  1. Depression, major, single episode, moderate (HCC) PHQ 9 score = 15  -She would like to start medication to help with her symptoms.  We will start her off on Wellbutrin 150 mg extended release.  She has an appointment scheduled for her physical in 3 weeks andwe will reassess then - buPROPion (WELLBUTRIN XL) 150 MG 24 hr tablet; Take 1 tablet (150 mg total) by mouth daily.  Dispense: 90 tablet; Refill: 0     I discussed the assessment and treatment plan with the patient. The patient was provided an opportunity to ask questions and all were answered. The patient agreed with the plan and demonstrated an understanding of the instructions.   The patient was advised to call back or seek an in-person evaluation if the symptoms worsen or if the condition fails to improve as anticipated.   Shirline Frees, NP

## 2020-02-06 NOTE — Telephone Encounter (Signed)
Pt has OV with PCP today.

## 2020-02-13 ENCOUNTER — Encounter (INDEPENDENT_AMBULATORY_CARE_PROVIDER_SITE_OTHER): Payer: Self-pay | Admitting: Family Medicine

## 2020-02-13 ENCOUNTER — Ambulatory Visit (INDEPENDENT_AMBULATORY_CARE_PROVIDER_SITE_OTHER): Payer: BC Managed Care – PPO | Admitting: Family Medicine

## 2020-02-13 ENCOUNTER — Other Ambulatory Visit: Payer: Self-pay

## 2020-02-13 VITALS — BP 112/76 | HR 74 | Temp 98.7°F | Ht 65.0 in | Wt 278.0 lb

## 2020-02-13 DIAGNOSIS — F321 Major depressive disorder, single episode, moderate: Secondary | ICD-10-CM

## 2020-02-13 DIAGNOSIS — Z6841 Body Mass Index (BMI) 40.0 and over, adult: Secondary | ICD-10-CM

## 2020-02-13 DIAGNOSIS — E559 Vitamin D deficiency, unspecified: Secondary | ICD-10-CM | POA: Diagnosis not present

## 2020-02-13 DIAGNOSIS — Z9189 Other specified personal risk factors, not elsewhere classified: Secondary | ICD-10-CM | POA: Diagnosis not present

## 2020-02-13 DIAGNOSIS — E1169 Type 2 diabetes mellitus with other specified complication: Secondary | ICD-10-CM

## 2020-02-17 MED ORDER — VITAMIN D (ERGOCALCIFEROL) 1.25 MG (50000 UNIT) PO CAPS: 50000.0000 [IU] | ORAL_CAPSULE | ORAL | 0 refills | Status: DC

## 2020-02-17 NOTE — Progress Notes (Signed)
Chief Complaint:   OBESITY Lauren Powell is here to discuss her progress with her obesity treatment plan along with follow-up of her obesity related diagnoses. Lauren Powell is on the Category 3 Plan and states she is following her eating plan approximately 80% of the time. Lauren Powell states she is doing circuit training 45 minutes 3-4 times per week.  Total lbs lost since last in-office visit: 2  Interim History: Lauren Powell recently struggled with work and depression. She has already seen her PCP and Wellbutrin started. Feeling better. No side effects. She continues to follow her eating plan and exercise regularly. CPE scheduled for September.   Assessment/Plan:   1. Type 2 diabetes mellitus with other specified complication, without long-term current use of insulin (HCC) The current medical regimen is effective;  continue present plan and medications.  Lab Results  Component Value Date   HGBA1C 5.6 08/07/2019   HGBA1C 5.7 12/21/2018   HGBA1C 5.8 12/15/2017   Lab Results  Component Value Date   LDLCALC 83 08/07/2019   CREATININE 0.80 08/07/2019   BP goal < 130/80.LDL goal of < 100 or < 70 if "very high" risk of CVD, HDL > 40 and TG < 150. All patients with diabetes should be on a statin unless there is a contraindication. Dietary recommendations: < 100 g carbohydrates daily.  Physical Activity recommendations: See below.  2. Depression, major, single episode, moderate (HCC) See previous mychart messages and visit with PCP. Situational. Started Wellbutrin. Feeling better. Will continue to monitor symptoms as they relate to her weight loss journey. This issue directly impacts care plan for optimization of BMI and metabolic health as it impacts the patient's ability to make lifestyle changes.  3. Vitamin D deficiency Not at goal. Optimal goal > 50 ng/dL. There is also evidence to support a goal of >70 ng/dL in patients with cancer and heart disease. Plan: Continue Vitamin D @50 ,000 IU every week with  follow-up for routine testing of Vitamin D at least 2-3 times per year to avoid over-replacement.  - Vitamin D, Ergocalciferol, (DRISDOL) 1.25 MG (50000 UNIT) CAPS capsule; Take 1 capsule (50,000 Units total) by mouth every 7 (seven) days.  Dispense: 4 capsule; Refill: 0  4. At risk for adverse reaction to treatment Lauren Powell was given approximately 15 minutes of drug side effect counseling today.  We discussed side effect possibility and risk versus benefits. Lauren Powell agreed to the medication and will contact this office if these side effects are intolerable.  We discussed Wellbutrin benefits v risks. So far, she seems to be doing well. Warned to watch for increased anxiety or irritability.   5. Class 3 severe obesity with serious comorbidity and body mass index (BMI) of 45.0 to 49.9 in adult, unspecified obesity type (HCC)  Lauren Powell is currently in the action stage of change. As such, her goal is to continue with weight loss efforts. She has agreed to the Category 3 Plan.   Exercise goals: For substantial health benefits, adults should do at least 150 minutes (2 hours and 30 minutes) a week of moderate-intensity, or 75 minutes (1 hour and 15 minutes) a week of vigorous-intensity aerobic physical activity, or an equivalent combination of moderate- and vigorous-intensity aerobic activity. Aerobic activity should be performed in episodes of at least 10 minutes, and preferably, it should be spread throughout the week. Adults should also include muscle-strengthening activities that involve all major muscle groups on 2 or more days a week.  Behavioral modification strategies: increasing lean protein intake, increasing  water intake and emotional eating strategies.  Lauren Powell has agreed to follow-up with our clinic in 3 weeks. She was informed of the importance of frequent follow-up visits to maximize her success with intensive lifestyle modifications for her multiple health conditions.   Objective:   Blood  pressure 112/76, pulse 74, temperature 98.7 F (37.1 C), temperature source Oral, height 5\' 5"  (1.651 m), weight 278 lb (126.1 kg), SpO2 100 %. Body mass index is 46.26 kg/m.  General: Cooperative, alert, well developed, in no acute distress. HEENT: Conjunctivae and lids unremarkable. Cardiovascular: Regular rhythm.  Lungs: Normal work of breathing. Neurologic: No focal deficits.   Lab Results  Component Value Date   CREATININE 0.80 08/07/2019   BUN 8 08/07/2019   NA 140 08/07/2019   K 3.8 08/07/2019   CL 103 08/07/2019   CO2 24 08/07/2019   Lab Results  Component Value Date   ALT 8 08/07/2019   AST 11 08/07/2019   ALKPHOS 78 08/07/2019   BILITOT 0.3 08/07/2019   Lab Results  Component Value Date   HGBA1C 5.6 08/07/2019   HGBA1C 5.7 12/21/2018   HGBA1C 5.8 12/15/2017   HGBA1C 5.8 12/29/2016   HGBA1C 6.0 05/29/2015   Lab Results  Component Value Date   INSULIN 13.2 08/07/2019   Lab Results  Component Value Date   TSH 0.974 08/07/2019   Lab Results  Component Value Date   CHOL 158 08/07/2019   HDL 63 08/07/2019   LDLCALC 83 08/07/2019   TRIG 59 08/07/2019   CHOLHDL 2.5 08/07/2019   Lab Results  Component Value Date   WBC 16.1 (H) 08/07/2019   HGB 11.0 (L) 08/07/2019   HCT 34.2 08/07/2019   MCV 78 (L) 08/07/2019   PLT 301 08/07/2019   Lab Results  Component Value Date   IRON 43 08/07/2019   TIBC 316 08/07/2019   FERRITIN 25 08/07/2019   Attestation Statements:   Reviewed by clinician on day of visit: allergies, medications, problem list, medical history, surgical history, family history, social history, and previous encounter notes.

## 2020-02-18 ENCOUNTER — Encounter (INDEPENDENT_AMBULATORY_CARE_PROVIDER_SITE_OTHER): Payer: Self-pay | Admitting: Family Medicine

## 2020-02-27 ENCOUNTER — Encounter: Payer: Self-pay | Admitting: Adult Health

## 2020-02-27 ENCOUNTER — Ambulatory Visit (INDEPENDENT_AMBULATORY_CARE_PROVIDER_SITE_OTHER): Payer: BC Managed Care – PPO | Admitting: Adult Health

## 2020-02-27 ENCOUNTER — Other Ambulatory Visit: Payer: Self-pay

## 2020-02-27 VITALS — BP 110/76 | HR 88 | Temp 98.1°F | Ht 65.0 in | Wt 282.0 lb

## 2020-02-27 DIAGNOSIS — Z0000 Encounter for general adult medical examination without abnormal findings: Secondary | ICD-10-CM

## 2020-02-27 DIAGNOSIS — R7303 Prediabetes: Secondary | ICD-10-CM | POA: Diagnosis not present

## 2020-02-27 DIAGNOSIS — F321 Major depressive disorder, single episode, moderate: Secondary | ICD-10-CM | POA: Diagnosis not present

## 2020-02-27 DIAGNOSIS — E559 Vitamin D deficiency, unspecified: Secondary | ICD-10-CM | POA: Diagnosis not present

## 2020-02-27 NOTE — Progress Notes (Signed)
Subjective:    Patient ID: Lauren Powell, female    DOB: 11-16-1985, 34 y.o.   MRN: [id number]  HPI Patient presents for yearly preventative medicine examination. She is a pleasant 34 year old female who  has a past medical history of Allergy, Eczema, Female infertility, History of anemia, Obesity, and Vitamin D deficiency.  Class 3 severe obesity -being seen by healthy weight loss clinic.  She is following her eating plan approximately 8% of the time.  She is doing circuit training 45 minutes 3-4 times a week.  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  Since starting Wellbutrin she is feeling better and states " I am about 80 % better."   Glucose Intolerance -prescribed Metformin by weight loss clinic.  Denies side effects and is tolerating well Lab Results  Component Value Date   HGBA1C 5.6 08/07/2019   Vitamin D Deficiency - takes Vitamin D 50,000 units daily.  Last vitamin D Lab Results  Component Value Date   VD25OH 15.6 (L) 08/07/2019    All immunizations and health maintenance protocols were reviewed with the patient and needed orders were placed. She refuses flu vaccination  Appropriate screening laboratory values were ordered for the patient including screening of hyperlipidemia, renal function and hepatic function.  Medication reconciliation,  past medical history, social history, problem list and allergies were reviewed in detail with the patient  Goals were established with regard to weight loss, exercise, and  diet in compliance with medications  No acute complaints today   Review of Systems  Constitutional: Negative.   HENT: Negative.   Eyes: Negative.   Respiratory: Negative.   Cardiovascular: Negative.   Gastrointestinal: Negative.   Endocrine: Negative.   Genitourinary: Negative.   Musculoskeletal: Negative.   Skin: Negative.   Allergic/Immunologic:  Negative.   Neurological: Negative.   Hematological: Negative.   Psychiatric/Behavioral: Negative.    Past Medical History:  Diagnosis Date  . Allergy   . Eczema   . Female infertility   . History of anemia   . Obesity   . Vitamin D deficiency     Social History   Socioeconomic History  . Marital status: Divorced    Spouse name: Not on file  . Number of children: Not on file  . Years of education: Not on file  . Highest education level: Not on file  Occupational History  . Occupation: Oncologist  Tobacco Use  . Smoking status: Never Smoker  . Smokeless tobacco: Never Used  Vaping Use  . Vaping Use: Never used  Substance and Sexual Activity  . Alcohol use: No  . Drug use: No  . Sexual activity: Yes    Partners: Male    Birth control/protection: None  Other Topics Concern  . Not on file  Social History Narrative   Patient lives with son   Teaches pre school and kindergarten.    Social Determinants of Health   Financial Resource Strain:   . Difficulty of Paying Living Expenses: Not on file  Food Insecurity:   . Worried About Charity fundraiser in the Last Year: Not on file  . Ran Out of Food in the Last Year: Not on file  Transportation Needs:   . Lack of Transportation (Medical): Not on file  . Lack of Transportation (Non-Medical): Not on file  Physical Activity:   . Days of Exercise per Week: Not on file  .  Minutes of Exercise per Session: Not on file  Stress:   . Feeling of Stress : Not on file  Social Connections:   . Frequency of Communication with Friends and Family: Not on file  . Frequency of Social Gatherings with Friends and Family: Not on file  . Attends Religious Services: Not on file  . Active Member of Clubs or Organizations: Not on file  . Attends Archivist Meetings: Not on file  . Marital Status: Not on file  Intimate Partner Violence:   . Fear of Current or Ex-Partner: Not on file  . Emotionally Abused: Not on file    . Physically Abused: Not on file  . Sexually Abused: Not on file    Past Surgical History:  Procedure Laterality Date  . CESAREAN SECTION    . DILATATION & CURETTAGE/HYSTEROSCOPY WITH MYOSURE N/A 08/06/2019   Procedure: [place];  Surgeon: Servando Salina, MD;  Location: [facility];  Service: Gynecology;  Laterality: N/A;  . TONSILLECTOMY  04/30/2019  . TOOTH EXTRACTION N/A     Family History  Problem Relation Age of Onset  . Alcohol abuse Mother   . Drug abuse Mother   . Bipolar disorder Mother   . Depression Mother   . Hypertension Paternal Aunt   . Hypertension Paternal Grandmother   . Prostate cancer Paternal Grandfather     No Known Allergies  Current Outpatient Medications on File Prior to Visit  Medication Sig Dispense Refill  . azelastine (ASTELIN) 0.1 % nasal spray Place 2 sprays into both nostrils 2 (two) times daily. Use in each nostril as directed 30 mL 3  . buPROPion (WELLBUTRIN XL) 150 MG 24 hr tablet Take 1 tablet (150 mg total) by mouth daily. 90 tablet 0  . meloxicam (MOBIC) 15 MG tablet Take 1 tablet (15 mg total) by mouth daily. 30 tablet 1  . metFORMIN (GLUCOPHAGE) 500 MG tablet Take 1 tablet (500 mg total) by mouth daily. 30 tablet 0  . methylPREDNISolone (MEDROL DOSEPAK) 4 MG TBPK tablet 6 day dose pack - take as directed 21 tablet 0  . montelukast (SINGULAIR) 10 MG tablet Take 1 tablet (10 mg total) by mouth at bedtime. 30 tablet 1  . Olopatadine HCl 0.2 % SOLN INSTILL 1 DROP INTO AFFECTED EYE EVERY DAY    . Vitamin D, Ergocalciferol, (DRISDOL) 1.25 MG (50000 UNIT) CAPS capsule Take 1 capsule (50,000 Units total) by mouth every 7 (seven) days. 4 capsule 0   No current facility-administered medications on file prior to visit.    There were no vitals taken for this visit.      Objective:   Physical Exam Vitals and nursing note reviewed.  Constitutional:      General: She is not in acute  distress.    Appearance: Normal appearance. She is well-developed. She is obese. She is not ill-appearing.  HENT:     Head: Normocephalic and atraumatic.     Right Ear: Tympanic membrane, ear canal and external ear normal. There is no impacted cerumen.     Left Ear: Tympanic membrane, ear canal and external ear normal. There is no impacted cerumen.     Nose: Nose normal. No congestion or rhinorrhea.     Mouth/Throat:     Mouth: Mucous membranes are moist.     Pharynx: Oropharynx is clear. No oropharyngeal exudate or posterior oropharyngeal erythema.  Eyes:     General:        Right eye:  No discharge.        Left eye: No discharge.     Extraocular Movements: Extraocular movements intact.     Conjunctiva/sclera: Conjunctivae normal.     Pupils: Pupils are equal, round, and reactive to light.  Neck:     Thyroid: No thyromegaly.     Vascular: No carotid bruit.     Trachea: No tracheal deviation.  Cardiovascular:     Rate and Rhythm: Normal rate and regular rhythm.     Pulses: Normal pulses.     Heart sounds: Normal heart sounds. No murmur heard.  No friction rub. No gallop.   Pulmonary:     Effort: Pulmonary effort is normal. No respiratory distress.     Breath sounds: Normal breath sounds. No stridor. No wheezing, rhonchi or rales.  Chest:     Chest wall: No tenderness.  Abdominal:     General: Abdomen is flat. Bowel sounds are normal. There is no distension.     Palpations: Abdomen is soft. There is no mass.     Tenderness: There is no abdominal tenderness. There is no right CVA tenderness, left CVA tenderness, guarding or rebound.     Hernia: No hernia is present.  Musculoskeletal:        General: No swelling, tenderness, deformity or signs of injury. Normal range of motion.     Cervical back: Normal range of motion and neck supple.     Right lower leg: No edema.     Left lower leg: No edema.  Lymphadenopathy:     Cervical: No cervical adenopathy.  Skin:    General: Skin is  warm and dry.     Coloration: Skin is not jaundiced or pale.     Findings: No bruising, erythema, lesion or rash.  Neurological:     General: No focal deficit present.     Mental Status: She is alert and oriented to person, place, and time.     Cranial Nerves: No cranial nerve deficit.     Sensory: No sensory deficit.     Motor: No weakness.     Coordination: Coordination normal.     Gait: Gait normal.     Deep Tendon Reflexes: Reflexes normal.  Psychiatric:        Mood and Affect: Mood normal.        Behavior: Behavior normal.        Thought Content: Thought content normal.        Judgment: Judgment normal.           Assessment & Plan:  1. Routine general medical examination at a health care facility - Continue with weight loss management  - Follow up in one year or sooner if needed - CBC with Differential/Platelet; Future - Hemoglobin A1c; Future - Lipid panel; Future - TSH; Future - CMP with eGFR([facility]); Future   2. Depression, major, single episode, moderate ([place]) - Improved greatly over the last three weeks.  - Continue with Wellbutrin 150 mg XR daily.   3. Prediabetes  - CBC with Differential/Platelet; Future - Hemoglobin A1c; Future - Lipid panel; Future - TSH; Future - CMP with eGFR([facility]); Future   4. Vitamin D deficiency  - VITAMIN D 25 Hydroxy (Vit-D Deficiency, Fractures); Future  Dorothyann Peng, NP

## 2020-02-28 ENCOUNTER — Encounter: Payer: Self-pay | Admitting: Adult Health

## 2020-02-28 LAB — CBC WITH DIFFERENTIAL/PLATELET
Absolute Monocytes: 400 cells/uL (ref 200–950)
Basophils Absolute: 60 cells/uL (ref 0–200)
Basophils Relative: 0.7 %
Eosinophils Absolute: 162 cells/uL (ref 15–500)
Eosinophils Relative: 1.9 %
HCT: 37.4 % (ref 35.0–45.0)
Hemoglobin: 11.9 g/dL (ref 11.7–15.5)
Lymphs Abs: 2244 cells/uL (ref 850–3900)
MCH: 25.1 pg — ABNORMAL LOW (ref 27.0–33.0)
MCHC: 31.8 g/dL — ABNORMAL LOW (ref 32.0–36.0)
MCV: 78.9 fL — ABNORMAL LOW (ref 80.0–100.0)
MPV: 9.7 fL (ref 7.5–12.5)
Monocytes Relative: 4.7 %
Neutro Abs: 5636 cells/uL (ref 1500–7800)
Neutrophils Relative %: 66.3 %
Platelets: 362 10*3/uL (ref 140–400)
RBC: 4.74 10*6/uL (ref 3.80–5.10)
RDW: 13.6 % (ref 11.0–15.0)
Total Lymphocyte: 26.4 %
WBC: 8.5 10*3/uL (ref 3.8–10.8)

## 2020-02-28 LAB — COMPLETE METABOLIC PANEL WITH GFR
AG Ratio: 1.4 (calc) (ref 1.0–2.5)
ALT: 9 U/L (ref 6–29)
AST: 11 U/L (ref 10–30)
Albumin: 4 g/dL (ref 3.6–5.1)
Alkaline phosphatase (APISO): 72 U/L (ref 31–125)
BUN: 9 mg/dL (ref 7–25)
CO2: 29 mmol/L (ref 20–32)
Calcium: 9.3 mg/dL (ref 8.6–10.2)
Chloride: 104 mmol/L (ref 98–110)
Creat: 1.08 mg/dL (ref 0.50–1.10)
GFR, Est African American: 77 mL/min/{1.73_m2} (ref >=60)
GFR, Est Non African American: 66 mL/min/{1.73_m2} (ref >=60)
Globulin: 2.9 g/dL (calc) (ref 1.9–3.7)
Glucose, Bld: 86 mg/dL (ref 65–99)
Potassium: 4 mmol/L (ref 3.5–5.3)
Sodium: 141 mmol/L (ref 135–146)
Total Bilirubin: 0.4 mg/dL (ref 0.2–1.2)
Total Protein: 6.9 g/dL (ref 6.1–8.1)

## 2020-02-28 LAB — LIPID PANEL
Cholesterol: 153 mg/dL (ref <200)
HDL: 49 mg/dL — ABNORMAL LOW (ref >=50)
LDL Cholesterol (Calc): 83 mg/dL (calc)
Non-HDL Cholesterol (Calc): 104 mg/dL (calc) (ref <130)
Total CHOL/HDL Ratio: 3.1 (calc) (ref <5.0)
Triglycerides: 109 mg/dL (ref <150)

## 2020-02-28 LAB — HEMOGLOBIN A1C
Hgb A1c MFr Bld: 5.8 % of total Hgb — ABNORMAL HIGH (ref <5.7)
Mean Plasma Glucose: 120 (calc)
eAG (mmol/L): 6.6 (calc)

## 2020-02-28 LAB — TSH: TSH: 1.23 mIU/L

## 2020-02-28 LAB — VITAMIN D 25 HYDROXY (VIT D DEFICIENCY, FRACTURES): Vit D, 25-Hydroxy: 26 ng/mL — ABNORMAL LOW (ref 30–100)

## 2020-03-05 ENCOUNTER — Ambulatory Visit (INDEPENDENT_AMBULATORY_CARE_PROVIDER_SITE_OTHER): Payer: BC Managed Care – PPO | Admitting: Family Medicine

## 2020-03-05 ENCOUNTER — Encounter (INDEPENDENT_AMBULATORY_CARE_PROVIDER_SITE_OTHER): Payer: Self-pay | Admitting: Family Medicine

## 2020-03-05 ENCOUNTER — Other Ambulatory Visit: Payer: Self-pay

## 2020-03-05 VITALS — BP 130/77 | HR 85 | Temp 98.0°F | Ht 65.0 in | Wt 279.0 lb

## 2020-03-05 DIAGNOSIS — Z6841 Body Mass Index (BMI) 40.0 and over, adult: Secondary | ICD-10-CM

## 2020-03-05 DIAGNOSIS — E559 Vitamin D deficiency, unspecified: Secondary | ICD-10-CM | POA: Diagnosis not present

## 2020-03-05 DIAGNOSIS — Z9189 Other specified personal risk factors, not elsewhere classified: Secondary | ICD-10-CM | POA: Diagnosis not present

## 2020-03-05 DIAGNOSIS — R7303 Prediabetes: Secondary | ICD-10-CM | POA: Diagnosis not present

## 2020-03-06 MED ORDER — VITAMIN D (ERGOCALCIFEROL) 1.25 MG (50000 UNIT) PO CAPS: 50000.0000 [IU] | ORAL_CAPSULE | ORAL | 0 refills | Status: DC

## 2020-03-06 NOTE — Progress Notes (Signed)
Chief Complaint:   OBESITY Lauren Powell is here to discuss her progress with her obesity treatment plan along with follow-up of her obesity related diagnoses. Lauren Powell is on the Category 3 Plan and states she is following her eating plan approximately 70% of the time. Lauren Powell states she is doing boot camp for 45 minutes . times per week.  Today's visit was #: 8 Starting weight: 276 lbs Starting date: 08/07/2019 Today's weight: 279 lbs Today's date: 03/05/2020 Total lbs lost to date: 0 Total lbs lost since last in-office visit: 0  Interim History: Lauren Powell says she is still exercising.  She is not drinking enough water.  Plan:  She will plan meals.  Discussed [id number].  Assessment/Plan:   1. Vitamin D deficiency Current vitamin D is 10, tested on 02/27/2020. Not at goal. Optimal goal > 50 ng/dL. There is also evidence to support a goal of >70 ng/dL in patients with cancer and heart disease. Plan: Continue Vitamin D @50 ,000 IU every week with follow-up for routine testing of Vitamin D at least 2-3 times per year to avoid over-replacement.  -Refill Vitamin D, Ergocalciferol, (DRISDOL) 1.25 MG (50000 UNIT) CAPS capsule; Take 1 capsule (50,000 Units total) by mouth every 7 (seven) days.  Dispense: 12 capsule; Refill: 0  2. Prediabetes Not optimized. Goal is HgbA1c < 5.7 and insulin level closer to 5. She will continue to focus on protein-rich, low simple carbohydrate foods. We reviewed the importance of hydration, regular exercise for stress reduction, and restorative sleep.   Lab Results  Component Value Date   HGBA1C 5.8 (H) 02/27/2020   Lab Results  Component Value Date   INSULIN 13.2 08/07/2019   3. At risk for heart disease Lauren Powell was given approximately 15 minutes of coronary artery disease prevention counseling today. She is 34 y.o. female and has risk factors for heart disease including obesity and insulin resistance. We discussed intensive lifestyle modifications today with an emphasis on  specific weight loss instructions and strategies.   During insulin resistance, several metabolic alterations induce the development of cardiovascular disease. For instance, insulin resistance can induce an imbalance in glucose metabolism that generates chronic hyperglycemia, which in turn triggers oxidative stress and causes an inflammatory response that leads to cell damage. Insulin resistance can also alter systemic lipid metabolism which then leads to the development of dyslipidemia and the well-known lipid triad: (1) high levels of plasma triglycerides, (2) low levels of high-density lipoprotein, and (3) the appearance of small dense low-density lipoproteins. This triad, along with endothelial dysfunction, which can also be induced by aberrant insulin signaling, contribute to atherosclerotic plaque formation.   4. Class 3 severe obesity with serious comorbidity and body mass index (BMI) of 45.0 to 49.9 in adult, unspecified obesity type (HCC)  Lauren Powell is currently in the action stage of change. As such, her goal is to continue with weight loss efforts. She has agreed to the Category 3 Plan.   Exercise goals: For substantial health benefits, adults should do at least 150 minutes (2 hours and 30 minutes) a week of moderate-intensity, or 75 minutes (1 hour and 15 minutes) a week of vigorous-intensity aerobic physical activity, or an equivalent combination of moderate- and vigorous-intensity aerobic activity. Aerobic activity should be performed in episodes of at least 10 minutes, and preferably, it should be spread throughout the week.  Behavioral modification strategies: increasing lean protein intake, decreasing simple carbohydrates, increasing vegetables, increasing water intake, decreasing sodium intake, increasing high fiber foods and meal planning and  cooking strategies.  Lauren Powell has agreed to follow-up with our clinic in 2-3 weeks. She was informed of the importance of frequent follow-up visits to  maximize her success with intensive lifestyle modifications for her multiple health conditions.   Objective:   Blood pressure 130/77, pulse 85, temperature 98 F (36.7 C), temperature source Oral, height 5\' 5"  (1.651 m), weight 279 lb (126.6 kg), SpO2 99 %. Body mass index is 46.43 kg/m.  General: Cooperative, alert, well developed, in no acute distress. HEENT: Conjunctivae and lids unremarkable. Cardiovascular: Regular rhythm.  Lungs: Normal work of breathing. Neurologic: No focal deficits.   Lab Results  Component Value Date   CREATININE 1.08 02/27/2020   BUN 9 02/27/2020   NA 141 02/27/2020   K 4.0 02/27/2020   CL 104 02/27/2020   CO2 29 02/27/2020   Lab Results  Component Value Date   ALT 9 02/27/2020   AST 11 02/27/2020   ALKPHOS 78 08/07/2019   BILITOT 0.4 02/27/2020   Lab Results  Component Value Date   HGBA1C 5.8 (H) 02/27/2020   HGBA1C 5.6 08/07/2019   HGBA1C 5.7 12/21/2018   HGBA1C 5.8 12/15/2017   HGBA1C 5.8 12/29/2016   Lab Results  Component Value Date   INSULIN 13.2 08/07/2019   Lab Results  Component Value Date   TSH 1.23 02/27/2020   Lab Results  Component Value Date   CHOL 153 02/27/2020   HDL 49 (L) 02/27/2020   LDLCALC 83 02/27/2020   TRIG 109 02/27/2020   CHOLHDL 3.1 02/27/2020   Lab Results  Component Value Date   WBC 8.5 02/27/2020   HGB 11.9 02/27/2020   HCT 37.4 02/27/2020   MCV 78.9 (L) 02/27/2020   PLT 362 02/27/2020   Lab Results  Component Value Date   IRON 43 08/07/2019   TIBC 316 08/07/2019   FERRITIN 25 08/07/2019   Attestation Statements:   Reviewed by clinician on day of visit: allergies, medications, problem list, medical history, surgical history, family history, social history, and previous encounter notes.  I, 10/07/2019, CMA, am acting as transcriptionist for Insurance claims handler, DO  I have reviewed the above documentation for accuracy and completeness, and I agree with the above. Helane Rima, DO

## 2020-03-25 ENCOUNTER — Other Ambulatory Visit: Payer: Self-pay

## 2020-03-25 ENCOUNTER — Ambulatory Visit (INDEPENDENT_AMBULATORY_CARE_PROVIDER_SITE_OTHER): Payer: BC Managed Care – PPO | Admitting: Family Medicine

## 2020-03-25 ENCOUNTER — Encounter (INDEPENDENT_AMBULATORY_CARE_PROVIDER_SITE_OTHER): Payer: Self-pay | Admitting: Family Medicine

## 2020-03-25 VITALS — BP 131/83 | HR 74 | Temp 98.1°F | Ht 65.0 in | Wt 279.0 lb

## 2020-03-25 DIAGNOSIS — J309 Allergic rhinitis, unspecified: Secondary | ICD-10-CM | POA: Diagnosis not present

## 2020-03-25 DIAGNOSIS — R7303 Prediabetes: Secondary | ICD-10-CM

## 2020-03-25 DIAGNOSIS — Z6841 Body Mass Index (BMI) 40.0 and over, adult: Secondary | ICD-10-CM

## 2020-03-25 DIAGNOSIS — E559 Vitamin D deficiency, unspecified: Secondary | ICD-10-CM

## 2020-03-25 MED ORDER — METFORMIN HCL 500 MG PO TABS: 500.0000 mg | ORAL_TABLET | Freq: Every day | ORAL | 0 refills | Status: DC

## 2020-03-25 MED ORDER — VITAMIN D (ERGOCALCIFEROL) 1.25 MG (50000 UNIT) PO CAPS: 50000.0000 [IU] | ORAL_CAPSULE | ORAL | 0 refills | Status: DC

## 2020-03-25 NOTE — Progress Notes (Signed)
Chief Complaint:   OBESITY Aricela is here to discuss her progress with her obesity treatment plan along with follow-up of her obesity related diagnoses.   Today's visit was #: 9 Starting weight: 276 lbs Starting date: 08/07/2019 Today's weight: 279 lbs Today's date: 03/26/2020 Total lbs lost to date: +3 lbs Body mass index is 46.43 kg/m.   Interim History: Avarae has an appointment with the allergist on November 17.  She has maintained her weight and is happy about that.  She says that work is going well.  Nutrition Plan: Category 3 for 50% of the time. Anti-obesity medications: Wellbutrin, Metformin. Reported side effects: None. Hunger is moderately controlled controlled. Cravings are moderately controlled controlled.  Activity: None. Sleep: Sleep is less restful.  Barriers: Allergic rhinitis, worsening quality of sleep and decreasing exercise.  Assessment/Plan:   1. Vitamin D deficiency Current vitamin D is 26.0, tested on 02/27/2020. Not at goal. Optimal goal > 50 ng/dL.  Plan: Continue Vitamin D @50 ,000 IU every week with follow-up for routine testing of Vitamin D at least 2-3 times per year to avoid over-replacement.  - Refill Vitamin D, Ergocalciferol, (DRISDOL) 1.25 MG (50000 UNIT) CAPS capsule; Take 1 capsule (50,000 Units total) by mouth every 7 (seven) days.  Dispense: 12 capsule; Refill: 0  2. Prediabetes Not at goal. Goal is HgbA1c < 5.7 and insulin level closer to 5. She will continue to focus on protein-rich, low simple carbohydrate foods. We reviewed the importance of hydration, regular exercise for stress reduction, and restorative sleep.   Lab Results  Component Value Date   HGBA1C 5.8 (H) 02/27/2020   Lab Results  Component Value Date   INSULIN 13.2 08/07/2019   - Refill metFORMIN (GLUCOPHAGE) 500 MG tablet; Take 1 tablet (500 mg total) by mouth daily.  Dispense: 90 tablet; Refill: 0  3. Allergic rhinitis She says this has been worse lately.     Plan:  Florene will see the allergist next month.  4. Class 3 severe obesity with serious comorbidity and body mass index (BMI) of 45.0 to 49.9 in adult, unspecified obesity type (HCC)  Virlee is currently in the action stage of change. As such, her goal is to continue with weight loss efforts.   Nutrition goals: She has agreed to the Category 3 Plan.   Exercise goals: For substantial health benefits, adults should do at least 150 minutes (2 hours and 30 minutes) a week of moderate-intensity, or 75 minutes (1 hour and 15 minutes) a week of vigorous-intensity aerobic physical activity, or an equivalent combination of moderate- and vigorous-intensity aerobic activity. Aerobic activity should be performed in episodes of at least 10 minutes, and preferably, it should be spread throughout the week.  Behavioral modification strategies: increasing lean protein intake, decreasing simple carbohydrates, increasing vegetables and increasing water intake.  Anvi has agreed to follow-up with our clinic in 3 weeks. She was informed of the importance of frequent follow-up visits to maximize her success with intensive lifestyle modifications for her multiple health conditions.   Objective:   Blood pressure 131/83, pulse 74, temperature 98.1 F (36.7 C), temperature source Oral, height 5\' 5"  (1.651 m), weight 279 lb (126.6 kg), SpO2 100 %. Body mass index is 46.43 kg/m.  General: Cooperative, alert, well developed, in no acute distress. HEENT: Conjunctivae and lids unremarkable. Cardiovascular: Regular rhythm.  Lungs: Normal work of breathing. Neurologic: No focal deficits.   Lab Results  Component Value Date   CREATININE 1.08 02/27/2020   BUN  9 02/27/2020   NA 141 02/27/2020   K 4.0 02/27/2020   CL 104 02/27/2020   CO2 29 02/27/2020   Lab Results  Component Value Date   ALT 9 02/27/2020   AST 11 02/27/2020   ALKPHOS 78 08/07/2019   BILITOT 0.4 02/27/2020   Lab Results  Component Value  Date   HGBA1C 5.8 (H) 02/27/2020   HGBA1C 5.6 08/07/2019   HGBA1C 5.7 12/21/2018   HGBA1C 5.8 12/15/2017   HGBA1C 5.8 12/29/2016   Lab Results  Component Value Date   INSULIN 13.2 08/07/2019   Lab Results  Component Value Date   TSH 1.23 02/27/2020   Lab Results  Component Value Date   CHOL 153 02/27/2020   HDL 49 (L) 02/27/2020   LDLCALC 83 02/27/2020   TRIG 109 02/27/2020   CHOLHDL 3.1 02/27/2020   Lab Results  Component Value Date   WBC 8.5 02/27/2020   HGB 11.9 02/27/2020   HCT 37.4 02/27/2020   MCV 78.9 (L) 02/27/2020   PLT 362 02/27/2020   Lab Results  Component Value Date   IRON 43 08/07/2019   TIBC 316 08/07/2019   FERRITIN 25 08/07/2019   Attestation Statements:   Reviewed by clinician on day of visit: allergies, medications, problem list, medical history, surgical history, family history, social history, and previous encounter notes.  I, Insurance claims handler, CMA, am acting as transcriptionist for Helane Rima, DO  I have reviewed the above documentation for accuracy and completeness, and I agree with the above. Helane Rima, DO

## 2020-04-02 ENCOUNTER — Other Ambulatory Visit: Payer: Self-pay | Admitting: Family Medicine

## 2020-04-15 ENCOUNTER — Ambulatory Visit (INDEPENDENT_AMBULATORY_CARE_PROVIDER_SITE_OTHER): Payer: BC Managed Care – PPO | Admitting: Family Medicine

## 2020-04-23 ENCOUNTER — Ambulatory Visit (INDEPENDENT_AMBULATORY_CARE_PROVIDER_SITE_OTHER): Payer: BC Managed Care – PPO | Admitting: Family Medicine

## 2020-04-23 ENCOUNTER — Encounter (INDEPENDENT_AMBULATORY_CARE_PROVIDER_SITE_OTHER): Payer: Self-pay | Admitting: Family Medicine

## 2020-04-23 ENCOUNTER — Other Ambulatory Visit: Payer: Self-pay

## 2020-04-23 VITALS — BP 110/76 | HR 96 | Temp 98.6°F | Ht 65.0 in | Wt 280.0 lb

## 2020-04-23 DIAGNOSIS — Z9189 Other specified personal risk factors, not elsewhere classified: Secondary | ICD-10-CM | POA: Diagnosis not present

## 2020-04-23 DIAGNOSIS — R7303 Prediabetes: Secondary | ICD-10-CM

## 2020-04-23 DIAGNOSIS — T781XXA Other adverse food reactions, not elsewhere classified, initial encounter: Secondary | ICD-10-CM

## 2020-04-23 DIAGNOSIS — T781XXS Other adverse food reactions, not elsewhere classified, sequela: Secondary | ICD-10-CM

## 2020-04-23 DIAGNOSIS — J45909 Unspecified asthma, uncomplicated: Secondary | ICD-10-CM | POA: Diagnosis not present

## 2020-04-23 DIAGNOSIS — Z6841 Body Mass Index (BMI) 40.0 and over, adult: Secondary | ICD-10-CM

## 2020-04-23 DIAGNOSIS — E559 Vitamin D deficiency, unspecified: Secondary | ICD-10-CM

## 2020-04-24 ENCOUNTER — Encounter (INDEPENDENT_AMBULATORY_CARE_PROVIDER_SITE_OTHER): Payer: Self-pay | Admitting: Family Medicine

## 2020-04-24 MED ORDER — METFORMIN HCL 500 MG PO TABS: 500.0000 mg | ORAL_TABLET | Freq: Every day | ORAL | 0 refills | Status: DC

## 2020-04-24 MED ORDER — VITAMIN D (ERGOCALCIFEROL) 1.25 MG (50000 UNIT) PO CAPS: 50000.0000 [IU] | ORAL_CAPSULE | ORAL | 0 refills | Status: DC

## 2020-04-28 NOTE — Progress Notes (Signed)
Chief Complaint:   OBESITY Lauren Powell is here to discuss her progress with her obesity treatment plan along with follow-up of her obesity related diagnoses.   Today's visit was #: 10 Starting weight: 276 lbs Starting date: 08/07/2019 Today's weight: 280 lbs Today's date: 04/23/2020 Total lbs lost to date: +4 lbs Body mass index is 46.59 kg/m.   Interim History: Lauren Powell had an allergy test yesterday.  She says it "lit up" on her back.  Will start shots, Epipen.  She is asthmatic as well and is getting an inhaler.  Food allergies are orange, tree nuts, milk, and pineapple.    Nutrition Plan: the Category 3 Plan for 80% of the time.  Anti-obesity medications: metformin. Reported side effects: none. Hunger is moderately controlled controlled. Cravings are moderately controlled controlled.  Activity: None. Sleep: Sleep is not restful.   Assessment/Plan:   1. Prediabetes Improving, but not optimized. Goal is HgbA1c < 5.7.  Medication: metformin 500 mg daily.  She will continue to focus on protein-rich, low simple carbohydrate foods. We reviewed the importance of hydration, regular exercise for stress reduction, and restorative sleep.   Lab Results  Component Value Date   HGBA1C 5.8 (H) 02/27/2020   Lab Results  Component Value Date   INSULIN 13.2 08/07/2019   - Refill metFORMIN (GLUCOPHAGE) 500 MG tablet; Take 1 tablet (500 mg total) by mouth daily.  Dispense: 90 tablet; Refill: 0  2. Vitamin D deficiency Not at goal. Current vitamin D is 26.0, tested on 02/27/2020. Optimal goal > 50 ng/dL.   Plan:  [x]   Continue Vitamin D @50 ,000 IU every week. []   Continue home supplement daily. [x]   Follow-up for routine testing of Vitamin D at least 2-3 times per year to avoid over-replacement.  - Refill Vitamin D, Ergocalciferol, (DRISDOL) 1.25 MG (50000 UNIT) CAPS capsule; Take 1 capsule (50,000 Units total) by mouth every 7 (seven) days.  Dispense: 4 capsule; Refill: 0  3. Oral allergy  syndrome, sequela Will avoid foods she is allergic to. We will continue to monitor symptoms as they relate to her weight loss journey.  4. Asthma, newly diagnosed by Allergy Lauren Powell will be starting an inhaler. We will continue to monitor symptoms as they relate to her weight loss journey.  5. At risk for activity intolerance Lauren Powell was given approximately 15 minutes of exercise intolerance counseling today. She is 34 y.o. female and has risk factors exercise intolerance including obesity, allergies, and asthma. We discussed intensive lifestyle modifications today with an emphasis on specific weight loss instructions and strategies. Lauren Powell will slowly increase activity as tolerated.  6. Class 3 severe obesity with serious comorbidity and body mass index (BMI) of 45.0 to 49.9 in adult, unspecified obesity type (HCC)  Course: Lauren Powell is currently in the action stage of change. As such, her goal is to continue with weight loss efforts.   Nutrition goals: She has agreed to the Category 3 Plan.   Exercise goals: For substantial health benefits, adults should do at least 150 minutes (2 hours and 30 minutes) a week of moderate-intensity, or 75 minutes (1 hour and 15 minutes) a week of vigorous-intensity aerobic physical activity, or an equivalent combination of moderate- and vigorous-intensity aerobic activity. Aerobic activity should be performed in episodes of at least 10 minutes, and preferably, it should be spread throughout the week.  Behavioral modification strategies: increasing lean protein intake, decreasing simple carbohydrates, increasing vegetables and increasing water intake.  Lauren Powell has agreed to follow-up with our  clinic in 3-4 weeks. She was informed of the importance of frequent follow-up visits to maximize her success with intensive lifestyle modifications for her multiple health conditions.   Objective:   Blood pressure 110/76, pulse 96, temperature 98.6 F (37 C), height 5\' 5"  (1.651  m), weight 280 lb (127 kg), SpO2 99 %. Body mass index is 46.59 kg/m.  General: Cooperative, alert, well developed, in no acute distress. HEENT: Conjunctivae and lids unremarkable. Cardiovascular: Regular rhythm.  Lungs: Normal work of breathing. Neurologic: No focal deficits.   Lab Results  Component Value Date   CREATININE 1.08 02/27/2020   BUN 9 02/27/2020   NA 141 02/27/2020   K 4.0 02/27/2020   CL 104 02/27/2020   CO2 29 02/27/2020   Lab Results  Component Value Date   ALT 9 02/27/2020   AST 11 02/27/2020   ALKPHOS 78 08/07/2019   BILITOT 0.4 02/27/2020   Lab Results  Component Value Date   HGBA1C 5.8 (H) 02/27/2020   HGBA1C 5.6 08/07/2019   HGBA1C 5.7 12/21/2018   HGBA1C 5.8 12/15/2017   HGBA1C 5.8 12/29/2016   Lab Results  Component Value Date   INSULIN 13.2 08/07/2019   Lab Results  Component Value Date   TSH 1.23 02/27/2020   Lab Results  Component Value Date   CHOL 153 02/27/2020   HDL 49 (L) 02/27/2020   LDLCALC 83 02/27/2020   TRIG 109 02/27/2020   CHOLHDL 3.1 02/27/2020   Lab Results  Component Value Date   WBC 8.5 02/27/2020   HGB 11.9 02/27/2020   HCT 37.4 02/27/2020   MCV 78.9 (L) 02/27/2020   PLT 362 02/27/2020   Lab Results  Component Value Date   IRON 43 08/07/2019   TIBC 316 08/07/2019   FERRITIN 25 08/07/2019   Attestation Statements:   Reviewed by clinician on day of visit: allergies, medications, problem list, medical history, surgical history, family history, social history, and previous encounter notes.  I, 10/07/2019, CMA, am acting as transcriptionist for Insurance claims handler, DO  I have reviewed the above documentation for accuracy and completeness, and I agree with the above. Helane Rima, DO

## 2020-05-06 ENCOUNTER — Ambulatory Visit (INDEPENDENT_AMBULATORY_CARE_PROVIDER_SITE_OTHER): Payer: BC Managed Care – PPO | Admitting: Family Medicine

## 2020-05-06 ENCOUNTER — Other Ambulatory Visit: Payer: Self-pay

## 2020-05-06 ENCOUNTER — Encounter (INDEPENDENT_AMBULATORY_CARE_PROVIDER_SITE_OTHER): Payer: Self-pay | Admitting: Family Medicine

## 2020-05-06 VITALS — BP 125/82 | HR 83 | Temp 98.1°F | Ht 65.0 in | Wt 279.0 lb

## 2020-05-06 DIAGNOSIS — R7303 Prediabetes: Secondary | ICD-10-CM

## 2020-05-06 DIAGNOSIS — F3289 Other specified depressive episodes: Secondary | ICD-10-CM | POA: Diagnosis not present

## 2020-05-06 DIAGNOSIS — E559 Vitamin D deficiency, unspecified: Secondary | ICD-10-CM

## 2020-05-06 DIAGNOSIS — Z6841 Body Mass Index (BMI) 40.0 and over, adult: Secondary | ICD-10-CM

## 2020-05-06 NOTE — Progress Notes (Signed)
Chief Complaint:   OBESITY Lauren Powell is here to discuss her progress with her obesity treatment plan along with follow-up of her obesity related diagnoses.   Today's visit was #: 11 Starting weight: 276 lbs Starting date: 08/07/2019 Today's weight: 279 lbs Today's date: 05/06/2020 Total lbs lost to date: +3 lbs Body mass index is 46.43 kg/m.   Interim History: Doing well - allergies now controlled, able to exercise more with inhaler use, sleep has improved, mood is good. Lauren Powell is ready to work on meal prepping, but would like more varied meal offerings. 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 Nutrition Plan: the Category 3 Plan for 75% of the time.  Anti-obesity medications: metformin 500 mg daily.  Activity: Burn [facility] for 45 minutes 3 times per week.  Assessment/Plan:   1. Prediabetes Improving, but not optimized. Goal is HgbA1c < 5.7.  Medication: metformin 500 mg daily.  She will continue to focus on protein-rich, low simple carbohydrate foods. We reviewed the importance of hydration, regular exercise for stress reduction, and restorative sleep.   Plan:  Continue metformin at current dose.  Lab Results  Component Value Date   HGBA1C 5.8 (H) 02/27/2020   Lab Results  Component Value Date   INSULIN 13.2 08/07/2019   2. Vitamin D deficiency Not at goal. Current vitamin D is 26.0, tested on 02/27/2020. Optimal goal > 50 ng/dL.   Plan:  [x]   Continue Vitamin D @50 ,000 IU every week. []   Continue home supplement daily. [x]   Follow-up for routine testing of Vitamin D at least 2-3 times per year to avoid over-replacement.  3. Other depression In remission.  Taking Wellbutrin XL 150 mg daily. The current medical regimen is effective;  continue present plan and medications.  4. Class 3 severe obesity with serious comorbidity and body mass index (BMI) of 45.0 to 49.9 in adult, unspecified obesity type (HCC)  Course: Lauren Powell is currently in the action stage of change. As  such, her goal is to continue with weight loss efforts.   Nutrition goals: She has agreed to the Category 3 Plan.   Exercise goals: For substantial health benefits, adults should do at least 150 minutes (2 hours and 30 minutes) a week of moderate-intensity, or 75 minutes (1 hour and 15 minutes) a week of vigorous-intensity aerobic physical activity, or an equivalent combination of moderate- and vigorous-intensity aerobic activity. Aerobic activity should be performed in episodes of at least 10 minutes, and preferably, it should be spread throughout the week.  Behavioral modification strategies: increasing lean protein intake, decreasing simple carbohydrates, increasing vegetables and increasing water intake.  Lauren Powell has agreed to follow-up with our clinic in 2-3 weeks. She was informed of the importance of frequent follow-up visits to maximize her success with intensive lifestyle modifications for her multiple health conditions.   Objective:   Blood pressure 125/82, pulse 83, temperature 98.1 F (36.7 C), temperature source Oral, height 5\' 5"  (1.651 m), weight 279 lb (126.6 kg), SpO2 97 %. Body mass index is 46.43 kg/m.  General: Cooperative, alert, well developed, in no acute distress. HEENT: Conjunctivae and lids unremarkable. Cardiovascular: Regular rhythm.  Lungs: Normal work of breathing. Neurologic: No focal deficits.   Lab Results  Component Value Date   CREATININE 1.08 02/27/2020   BUN 9 02/27/2020   NA 141 02/27/2020   K 4.0 02/27/2020   CL 104 02/27/2020   CO2 29 02/27/2020   Lab Results  Component Value Date   ALT 9  02/27/2020   AST 11 02/27/2020   ALKPHOS 78 08/07/2019   BILITOT 0.4 02/27/2020   Lab Results  Component Value Date   HGBA1C 5.8 (H) 02/27/2020   HGBA1C 5.6 08/07/2019   HGBA1C 5.7 12/21/2018   HGBA1C 5.8 12/15/2017   HGBA1C 5.8 12/29/2016   Lab Results  Component Value Date   INSULIN 13.2 08/07/2019   Lab Results  Component Value Date   TSH  1.23 02/27/2020   Lab Results  Component Value Date   CHOL 153 02/27/2020   HDL 49 (L) 02/27/2020   LDLCALC 83 02/27/2020   TRIG 109 02/27/2020   CHOLHDL 3.1 02/27/2020   Lab Results  Component Value Date   WBC 8.5 02/27/2020   HGB 11.9 02/27/2020   HCT 37.4 02/27/2020   MCV 78.9 (L) 02/27/2020   PLT 362 02/27/2020   Lab Results  Component Value Date   IRON 43 08/07/2019   TIBC 316 08/07/2019   FERRITIN 25 08/07/2019   Attestation Statements:   Reviewed by clinician on day of visit: allergies, medications, problem list, medical history, surgical history, family history, social history, and previous encounter notes.  I, Insurance claims handler, CMA, am acting as transcriptionist for Helane Rima, DO  I have reviewed the above documentation for accuracy and completeness, and I agree with the above. Helane Rima, DO

## 2020-05-26 ENCOUNTER — Ambulatory Visit (INDEPENDENT_AMBULATORY_CARE_PROVIDER_SITE_OTHER): Payer: BC Managed Care – PPO | Admitting: Family Medicine

## 2020-05-26 ENCOUNTER — Encounter (INDEPENDENT_AMBULATORY_CARE_PROVIDER_SITE_OTHER): Payer: Self-pay | Admitting: Family Medicine

## 2020-05-26 ENCOUNTER — Other Ambulatory Visit: Payer: Self-pay

## 2020-05-26 VITALS — BP 127/83 | HR 57 | Temp 98.2°F | Ht 65.0 in | Wt 276.0 lb

## 2020-05-26 DIAGNOSIS — R7303 Prediabetes: Secondary | ICD-10-CM

## 2020-05-26 DIAGNOSIS — E559 Vitamin D deficiency, unspecified: Secondary | ICD-10-CM | POA: Diagnosis not present

## 2020-05-26 DIAGNOSIS — Z6841 Body Mass Index (BMI) 40.0 and over, adult: Secondary | ICD-10-CM

## 2020-05-26 DIAGNOSIS — Z9189 Other specified personal risk factors, not elsewhere classified: Secondary | ICD-10-CM

## 2020-05-26 DIAGNOSIS — F3289 Other specified depressive episodes: Secondary | ICD-10-CM

## 2020-05-26 MED ORDER — VITAMIN D (ERGOCALCIFEROL) 1.25 MG (50000 UNIT) PO CAPS: 50000.0000 [IU] | ORAL_CAPSULE | ORAL | 0 refills | Status: DC

## 2020-05-26 NOTE — Progress Notes (Signed)
Chief Complaint:   OBESITY Lauren Powell is here to discuss her progress with her obesity treatment plan along with follow-up of her obesity related diagnoses.   Today's visit was #: 12 Starting weight: 276 lbs Starting date: 276 lbs Today's weight: 276 lbs Today's date: 05/26/2020 Total lbs lost to date: 0 Body mass index is 45.93 kg/m.   Interim History: Lauren Powell says she is able to meal plan more using [place], Rd, PhD method.  She will be going to [place] for 1 week after Christmas. Nutrition Plan: the Category 3 Plan for 75% of the time.  Anti-obesity medications: metformin 500 mg mg daily. Reported side effects: None. Activity: [employer] for 45 minutes 3 times per week.  Assessment/Plan:   1. Vitamin D deficiency Not at goal. Current vitamin D is 26.0, tested on 02/27/2020. Optimal goal > 50 ng/dL.   Plan:  [x]   Continue Vitamin D @50 ,000 IU every week. []   Continue home supplement daily. [x]   Follow-up for routine testing of Vitamin D at least 2-3 times per year to avoid over-replacement.  2. Prediabetes Improving, but not optimized. Goal is HgbA1c < 5.7.  Medication: metformin 500 mg daily.  She will continue to focus on protein-rich, low simple carbohydrate foods. We reviewed the importance of hydration, regular exercise for stress reduction, and restorative sleep.   Lab Results  Component Value Date   HGBA1C 5.8 (H) 02/27/2020   Lab Results  Component Value Date   INSULIN 13.2 08/07/2019   3. Other depression, with emotional eating In remission.  Jody is taking Wellbutrin XL 150 mg daily.  Behavior modification techniques were discussed today to help Lauren Powell deal with her emotional/non-hunger eating behaviors.    4. At risk for heart disease Lauren Powell was given approximately 9 minutes of coronary artery disease prevention counseling today. She is 34 y.o. female and has risk factors for heart disease including obesity and prediabetes. We discussed intensive lifestyle  modifications today with an emphasis on specific weight loss instructions and strategies. Repetitive spaced learning was employed today to elicit superior memory formation and behavioral change.  During insulin resistance, several metabolic alterations induce the development of cardiovascular disease. For instance, insulin resistance can induce an imbalance in glucose metabolism that generates chronic hyperglycemia, which in turn triggers oxidative stress and causes an inflammatory response that leads to cell damage. Insulin resistance can also alter systemic lipid metabolism which then leads to the development of dyslipidemia and the well-known lipid triad: (1) high levels of plasma triglycerides, (2) low levels of high-density lipoprotein, and (3) the appearance of small dense low-density lipoproteins. This triad, along with endothelial dysfunction, which can also be induced by aberrant insulin signaling, contribute to atherosclerotic plaque formation.   5. Class 3 severe obesity with serious comorbidity and body mass index (BMI) of 45.0 to 49.9 in adult, unspecified obesity type (HCC)  Course: Rielly is currently in the action stage of change. As such, her goal is to continue with weight loss efforts.   Nutrition goals: She has agreed to the Category 3 Plan.   Exercise goals: For substantial health benefits, adults should do at least 150 minutes (2 hours and 30 minutes) a week of moderate-intensity, or 75 minutes (1 hour and 15 minutes) a week of vigorous-intensity aerobic physical activity, or an equivalent combination of moderate- and vigorous-intensity aerobic activity. Aerobic activity should be performed in episodes of at least 10 minutes, and preferably, it should be spread throughout the week.  Behavioral modification  strategies: increasing lean protein intake, decreasing simple carbohydrates, increasing vegetables, dealing with family or coworker sabotage, travel eating strategies and holiday  eating strategies .  Lauren Powell has agreed to follow-up with our clinic in 3 weeks. She was informed of the importance of frequent follow-up visits to maximize her success with intensive lifestyle modifications for her multiple health conditions.   Objective:   Blood pressure 127/83, pulse (!) 57, temperature 98.2 F (36.8 C), temperature source Oral, height 5\' 5"  (1.651 m), weight 276 lb (125.2 kg), SpO2 97 %. Body mass index is 45.93 kg/m.  General: Cooperative, alert, well developed, in no acute distress. HEENT: Conjunctivae and lids unremarkable. Cardiovascular: Regular rhythm.  Lungs: Normal work of breathing. Neurologic: No focal deficits.   Lab Results  Component Value Date   CREATININE 1.08 02/27/2020   BUN 9 02/27/2020   NA 141 02/27/2020   K 4.0 02/27/2020   CL 104 02/27/2020   CO2 29 02/27/2020   Lab Results  Component Value Date   ALT 9 02/27/2020   AST 11 02/27/2020   ALKPHOS 78 08/07/2019   BILITOT 0.4 02/27/2020   Lab Results  Component Value Date   HGBA1C 5.8 (H) 02/27/2020   HGBA1C 5.6 08/07/2019   HGBA1C 5.7 12/21/2018   HGBA1C 5.8 12/15/2017   HGBA1C 5.8 12/29/2016   Lab Results  Component Value Date   INSULIN 13.2 08/07/2019   Lab Results  Component Value Date   TSH 1.23 02/27/2020   Lab Results  Component Value Date   CHOL 153 02/27/2020   HDL 49 (L) 02/27/2020   LDLCALC 83 02/27/2020   TRIG 109 02/27/2020   CHOLHDL 3.1 02/27/2020   Lab Results  Component Value Date   WBC 8.5 02/27/2020   HGB 11.9 02/27/2020   HCT 37.4 02/27/2020   MCV 78.9 (L) 02/27/2020   PLT 362 02/27/2020   Lab Results  Component Value Date   IRON 43 08/07/2019   TIBC 316 08/07/2019   FERRITIN 25 08/07/2019   Attestation Statements:   Reviewed by clinician on day of visit: allergies, medications, problem list, medical history, surgical history, family history, social history, and previous encounter notes.  I, 10/07/2019, CMA, am acting as transcriptionist  for Insurance claims handler, DO  I have reviewed the above documentation for accuracy and completeness, and I agree with the above. Helane Rima, DO

## 2020-05-28 ENCOUNTER — Encounter: Payer: Self-pay | Admitting: Adult Health

## 2020-06-16 ENCOUNTER — Ambulatory Visit (INDEPENDENT_AMBULATORY_CARE_PROVIDER_SITE_OTHER): Payer: BC Managed Care – PPO | Admitting: Family Medicine

## 2020-06-16 ENCOUNTER — Other Ambulatory Visit: Payer: Self-pay

## 2020-06-16 VITALS — BP 126/83 | HR 85 | Temp 98.2°F | Ht 65.0 in | Wt 280.0 lb

## 2020-06-16 DIAGNOSIS — E559 Vitamin D deficiency, unspecified: Secondary | ICD-10-CM | POA: Diagnosis not present

## 2020-06-16 DIAGNOSIS — F5081 Binge eating disorder: Secondary | ICD-10-CM

## 2020-06-16 DIAGNOSIS — R7303 Prediabetes: Secondary | ICD-10-CM

## 2020-06-16 DIAGNOSIS — Z9189 Other specified personal risk factors, not elsewhere classified: Secondary | ICD-10-CM

## 2020-06-16 DIAGNOSIS — Z6841 Body Mass Index (BMI) 40.0 and over, adult: Secondary | ICD-10-CM

## 2020-06-16 MED ORDER — LISDEXAMFETAMINE DIMESYLATE 20 MG PO CAPS: 20.0000 mg | ORAL_CAPSULE | Freq: Every day | ORAL | 0 refills | Status: DC

## 2020-06-18 NOTE — Progress Notes (Signed)
Chief Complaint:   OBESITY Lauren Powell is here to discuss her progress with her obesity treatment plan along with follow-up of her obesity related diagnoses.   Today's visit was #: 13 Starting weight: 276 lbs Starting date: 08/07/2019 Today's weight: 280 lbs Today's date: 06/16/2020 Total lbs lost to date: 0 Body mass index is 46.59 kg/m.   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  She says she is under a lot of stress at work.  She reports doing more emotional eating and binging. Nutrition Plan: the Category 3 Plan for 50% of the time.  Anti-obesity medications: metformin 500 mg mg daily. Reported side effects: None. Activity: Walking/boot camp for 45 minutes 3-4 times per week. Stress: Elevated.  Assessment/Plan:   1. Prediabetes/Insulin Resistance Improving, but not optimized. Goal is HgbA1c < 5.7.  Medication: metformin 500 mg daily.  She will continue to focus on protein-rich, low simple carbohydrate foods. We reviewed the importance of hydration, regular exercise for stress reduction, and restorative sleep.   Lab Results  Component Value Date   HGBA1C 5.8 (H) 02/27/2020   Lab Results  Component Value Date   INSULIN 13.2 08/07/2019   2. Vitamin D deficiency Not at goal. Current vitamin D is 26.0, tested on 02/27/2020. Optimal goal > 50 ng/dL.   Plan:  [x]   Continue Vitamin D @50 ,000 IU every week. []   Continue home supplement daily. [x]   Follow-up for routine testing of Vitamin D at least 2-3 times per year to avoid over-replacement.  3. Binge eating disorder Toshie meets binge eating disorder (BED) requirements, including: binging 4-13 times a week , eating until feeling uncomfortably full, eating large amounts of food when not feeling physically full and depressed or guilty about binge eating.   People who binge eat feel as if they don't have control over how much they eat and have feelings of guilt or self-loathing after a binge eating episode. [employer] estimates  that about 30 percent of adults with binge eating disorder also have a history of ADHD. The FDA has approved Vyvanse as a treatment option for both ADHD and binge eating. Vyvanse targets the brain's reward center by increasing the levels of dopamine and norepinephrine, the chemicals of the brain responsible for feelings of pleasure. Mindful eating is the recommended nutritional approach to treating BED.   Plan:  Start Vyvanse 20 mg daily, as per below.  - Start lisdexamfetamine (VYVANSE) 20 MG capsule; Take 1 capsule (20 mg total) by mouth daily.  Dispense: 30 capsule; Refill: 0  Risk versus benefits of medication reviewed. The patient understands monitoring parameters and red flags. I have consulted the [place] Controlled Substances Registry for this patient, and feel the risk/benefit ratio today is favorable for proceeding with this prescription for a controlled substance. The patient understands monitoring parameters and red flags.   4. At risk for constipation Jaimi was given approximately 15 minutes of counseling today regarding prevention of constipation. She was encouraged to increase water and fiber intake.   5. Class 3 severe obesity with serious comorbidity and body mass index (BMI) of 45.0 to 49.9 in adult, unspecified obesity type (HCC)  Course: Lauren Powell is currently in the action stage of change. As such, her goal is to continue with weight loss efforts.   Nutrition goals: She has agreed to the Category 3 Plan.   Exercise goals: For substantial health benefits, adults should do at least 150 minutes (2 hours and 30 minutes) a week of moderate-intensity, or 75 minutes (  1 hour and 15 minutes) a week of vigorous-intensity aerobic physical activity, or an equivalent combination of moderate- and vigorous-intensity aerobic activity. Aerobic activity should be performed in episodes of at least 10 minutes, and preferably, it should be spread throughout the week.  Behavioral modification strategies:  increasing lean protein intake, decreasing simple carbohydrates, increasing vegetables, increasing water intake and decreasing liquid calories.  Lauren Powell has agreed to follow-up with our clinic in 2-3 weeks. She was informed of the importance of frequent follow-up visits to maximize her success with intensive lifestyle modifications for her multiple health conditions.   Objective:   Blood pressure 126/83, pulse 85, temperature 98.2 F (36.8 C), temperature source Oral, height 5\' 5"  (1.651 m), weight 280 lb (127 kg), SpO2 96 %. Body mass index is 46.59 kg/m.  General: Cooperative, alert, well developed, in no acute distress. HEENT: Conjunctivae and lids unremarkable. Cardiovascular: Regular rhythm.  Lungs: Normal work of breathing. Neurologic: No focal deficits.   Lab Results  Component Value Date   CREATININE 1.08 02/27/2020   BUN 9 02/27/2020   NA 141 02/27/2020   K 4.0 02/27/2020   CL 104 02/27/2020   CO2 29 02/27/2020   Lab Results  Component Value Date   ALT 9 02/27/2020   AST 11 02/27/2020   ALKPHOS 78 08/07/2019   BILITOT 0.4 02/27/2020   Lab Results  Component Value Date   HGBA1C 5.8 (H) 02/27/2020   HGBA1C 5.6 08/07/2019   HGBA1C 5.7 12/21/2018   HGBA1C 5.8 12/15/2017   HGBA1C 5.8 12/29/2016   Lab Results  Component Value Date   INSULIN 13.2 08/07/2019   Lab Results  Component Value Date   TSH 1.23 02/27/2020   Lab Results  Component Value Date   CHOL 153 02/27/2020   HDL 49 (L) 02/27/2020   LDLCALC 83 02/27/2020   TRIG 109 02/27/2020   CHOLHDL 3.1 02/27/2020   Lab Results  Component Value Date   WBC 8.5 02/27/2020   HGB 11.9 02/27/2020   HCT 37.4 02/27/2020   MCV 78.9 (L) 02/27/2020   PLT 362 02/27/2020   Lab Results  Component Value Date   IRON 43 08/07/2019   TIBC 316 08/07/2019   FERRITIN 25 08/07/2019   Attestation Statements:   Reviewed by clinician on day of visit: allergies, medications, problem list, medical history, surgical  history, family history, social history, and previous encounter notes.  I, 10/07/2019, CMA, am acting as transcriptionist for Insurance claims handler, DO  I have reviewed the above documentation for accuracy and completeness, and I agree with the above. Helane Rima, DO

## 2020-06-20 ENCOUNTER — Encounter (INDEPENDENT_AMBULATORY_CARE_PROVIDER_SITE_OTHER): Payer: Self-pay | Admitting: Family Medicine

## 2020-06-29 ENCOUNTER — Encounter (INDEPENDENT_AMBULATORY_CARE_PROVIDER_SITE_OTHER): Payer: Self-pay | Admitting: Family Medicine

## 2020-06-30 ENCOUNTER — Ambulatory Visit (INDEPENDENT_AMBULATORY_CARE_PROVIDER_SITE_OTHER): Payer: BC Managed Care – PPO | Admitting: Family Medicine

## 2020-06-30 ENCOUNTER — Other Ambulatory Visit: Payer: Self-pay

## 2020-06-30 VITALS — BP 130/80 | HR 103 | Temp 97.8°F | Ht 65.0 in | Wt 279.0 lb

## 2020-06-30 DIAGNOSIS — R748 Abnormal levels of other serum enzymes: Secondary | ICD-10-CM

## 2020-06-30 DIAGNOSIS — R7303 Prediabetes: Secondary | ICD-10-CM | POA: Diagnosis not present

## 2020-06-30 DIAGNOSIS — Z9189 Other specified personal risk factors, not elsewhere classified: Secondary | ICD-10-CM | POA: Diagnosis not present

## 2020-06-30 DIAGNOSIS — E559 Vitamin D deficiency, unspecified: Secondary | ICD-10-CM | POA: Insufficient documentation

## 2020-06-30 DIAGNOSIS — R718 Other abnormality of red blood cells: Secondary | ICD-10-CM

## 2020-06-30 DIAGNOSIS — Z6841 Body Mass Index (BMI) 40.0 and over, adult: Secondary | ICD-10-CM

## 2020-06-30 DIAGNOSIS — F5081 Binge eating disorder: Secondary | ICD-10-CM | POA: Insufficient documentation

## 2020-06-30 DIAGNOSIS — E65 Localized adiposity: Secondary | ICD-10-CM | POA: Diagnosis not present

## 2020-06-30 DIAGNOSIS — F50819 Binge eating disorder, unspecified: Secondary | ICD-10-CM | POA: Insufficient documentation

## 2020-07-01 NOTE — Progress Notes (Signed)
Chief Complaint:   OBESITY Lauren Powell is here to discuss her progress with her obesity treatment plan along with follow-up of her obesity related diagnoses.   Today's visit was #: 14 Starting weight: 276 lbs Starting date: 08/07/2019 Today's weight: 279 lbs Today's date: 06/30/2020 Total lbs lost to date: +3 Body mass index is 46.43 kg/m.   Interim History: Lauren Powell says that the medication is helping and she is better able to portion control.  She says she stayed with her sister and family last week since it snowed.  Nutrition Plan: the Category 3 Plan for 50% of the time.  Anti-obesity medications: metformin 500 mg daily. Reported side effects: None. Activity: [employer] for 45 minutes 3 times per week.  Assessment/Plan:   1. Vitamin D deficiency Not at goal. Current vitamin D is 26.0, tested on 02/27/2020. Optimal goal > 50 ng/dL.   Plan:  [x]   Continue Vitamin D @50 ,000 IU every week. []   Continue home supplement daily. [x]   Will check vitamin D level at next visit.  - VITAMIN D 25 Hydroxy (Vit-D Deficiency, Fractures)  2. Visceral obesity Current visceral fat rating: 16. Visceral fat rating should be < 13. Visceral adipose tissue is a hormonally active component of total body fat. This body composition phenotype is associated with medical disorders such as metabolic syndrome, cardiovascular disease and several malignancies including prostate, breast, and colorectal cancers. Starting goal: Lose 7-10% of starting weight.   3. Prediabetes Not at goal. Goal is HgbA1c < 5.7.  Medication: metformin 500 mg daily.  She will continue to focus on protein-rich, low simple carbohydrate foods. We reviewed the importance of hydration, regular exercise for stress reduction, and restorative sleep.  Will check labs at next visit.  Lab Results  Component Value Date   HGBA1C 5.8 (H) 02/27/2020   Lab Results  Component Value Date   INSULIN 13.2 08/07/2019   - Comprehensive metabolic  panel - Hemoglobin A1c  4. Microcytic red blood cells Will check anemia panel and CBC at next visit.  - Anemia panel - CBC with Differential/Platelet  5. Low serum high density lipoprotein (HDL) HDL was low at 49 at last check on 02/27/2020.  Will check lipid panel at next office visit.  Lab Results  Component Value Date   CHOL 153 02/27/2020   HDL 49 (L) 02/27/2020   LDLCALC 83 02/27/2020   TRIG 109 02/27/2020   CHOLHDL 3.1 02/27/2020   Lab Results  Component Value Date   ALT 9 02/27/2020   AST 11 02/27/2020   ALKPHOS 78 08/07/2019   BILITOT 0.4 02/27/2020   - Lipid panel  6. Binge eating disorder Lauren Powell meets binge eating disorder (BED) requirements, including: binging 4-13 times a week , eating until feeling uncomfortably full, eating large amounts of food when not feeling physically full and depressed or guilty about binge eating.  Lauren Powell is taking Vyvanse 20 mg daily.    People who binge eat feel as if they don't have control over how much they eat and have feelings of guilt or self-loathing after a binge eating episode. [employer] estimates that about 30 percent of adults with binge eating disorder also have a history of ADHD. The FDA has approved Vyvanse as a treatment option for both ADHD and binge eating. Vyvanse targets the brain's reward center by increasing the levels of dopamine and norepinephrine, the chemicals of the brain responsible for feelings of pleasure. Mindful eating is the recommended nutritional approach to treating BED.  7. At risk for constipation Lauren Powell was given approximately 8 minutes of counseling today regarding prevention of constipation. She was encouraged to increase water and fiber intake.   8. Class 3 severe obesity with serious comorbidity and body mass index (BMI) of 45.0 to 49.9 in adult, unspecified obesity type (HCC)  Course: Lauren Powell is currently in the action stage of change. As such, her goal is to continue with weight loss efforts.    Nutrition goals: She has agreed to the Category 3 Plan.   Exercise goals: For substantial health benefits, adults should do at least 150 minutes (2 hours and 30 minutes) a week of moderate-intensity, or 75 minutes (1 hour and 15 minutes) a week of vigorous-intensity aerobic physical activity, or an equivalent combination of moderate- and vigorous-intensity aerobic activity. Aerobic activity should be performed in episodes of at least 10 minutes, and preferably, it should be spread throughout the week.  Behavioral modification strategies: increasing lean protein intake, decreasing simple carbohydrates, increasing vegetables, increasing water intake and decreasing liquid calories.  Lauren Powell has agreed to follow-up with our clinic in 2 weeks. She was informed of the importance of frequent follow-up visits to maximize her success with intensive lifestyle modifications for her multiple health conditions.   Objective:   Blood pressure 130/80, pulse (!) 103, temperature 97.8 F (36.6 C), temperature source Oral, height 5\' 5"  (1.651 m), weight 279 lb (126.6 kg), SpO2 96 %. Body mass index is 46.43 kg/m.  General: Cooperative, alert, well developed, in no acute distress. HEENT: Conjunctivae and lids unremarkable. Cardiovascular: Regular rhythm.  Lungs: Normal work of breathing. Neurologic: No focal deficits.   Lab Results  Component Value Date   CREATININE 1.08 02/27/2020   BUN 9 02/27/2020   NA 141 02/27/2020   K 4.0 02/27/2020   CL 104 02/27/2020   CO2 29 02/27/2020   Lab Results  Component Value Date   ALT 9 02/27/2020   AST 11 02/27/2020   ALKPHOS 78 08/07/2019   BILITOT 0.4 02/27/2020   Lab Results  Component Value Date   HGBA1C 5.8 (H) 02/27/2020   HGBA1C 5.6 08/07/2019   HGBA1C 5.7 12/21/2018   HGBA1C 5.8 12/15/2017   HGBA1C 5.8 12/29/2016   Lab Results  Component Value Date   INSULIN 13.2 08/07/2019   Lab Results  Component Value Date   TSH 1.23 02/27/2020   Lab  Results  Component Value Date   CHOL 153 02/27/2020   HDL 49 (L) 02/27/2020   LDLCALC 83 02/27/2020   TRIG 109 02/27/2020   CHOLHDL 3.1 02/27/2020   Lab Results  Component Value Date   WBC 8.5 02/27/2020   HGB 11.9 02/27/2020   HCT 37.4 02/27/2020   MCV 78.9 (L) 02/27/2020   PLT 362 02/27/2020   Lab Results  Component Value Date   IRON 43 08/07/2019   TIBC 316 08/07/2019   FERRITIN 25 08/07/2019   Attestation Statements:   Reviewed by clinician on day of visit: allergies, medications, problem list, medical history, surgical history, family history, social history, and previous encounter notes.  I, 10/07/2019, CMA, am acting as transcriptionist for Insurance claims handler, DO  I have reviewed the above documentation for accuracy and completeness, and I agree with the above. Helane Rima, DO

## 2020-07-05 ENCOUNTER — Encounter (INDEPENDENT_AMBULATORY_CARE_PROVIDER_SITE_OTHER): Payer: Self-pay | Admitting: Family Medicine

## 2020-07-14 ENCOUNTER — Other Ambulatory Visit: Payer: Self-pay

## 2020-07-14 ENCOUNTER — Encounter (INDEPENDENT_AMBULATORY_CARE_PROVIDER_SITE_OTHER): Payer: Self-pay | Admitting: Family Medicine

## 2020-07-14 ENCOUNTER — Ambulatory Visit (INDEPENDENT_AMBULATORY_CARE_PROVIDER_SITE_OTHER): Payer: BC Managed Care – PPO | Admitting: Family Medicine

## 2020-07-14 VITALS — BP 119/82 | HR 75 | Temp 79.9°F | Ht 65.0 in | Wt 278.0 lb

## 2020-07-14 DIAGNOSIS — Z6841 Body Mass Index (BMI) 40.0 and over, adult: Secondary | ICD-10-CM

## 2020-07-14 DIAGNOSIS — E65 Localized adiposity: Secondary | ICD-10-CM

## 2020-07-14 DIAGNOSIS — F5081 Binge eating disorder: Secondary | ICD-10-CM | POA: Diagnosis not present

## 2020-07-14 DIAGNOSIS — E559 Vitamin D deficiency, unspecified: Secondary | ICD-10-CM | POA: Diagnosis not present

## 2020-07-14 DIAGNOSIS — Z9189 Other specified personal risk factors, not elsewhere classified: Secondary | ICD-10-CM

## 2020-07-14 MED ORDER — VITAMIN D (ERGOCALCIFEROL) 1.25 MG (50000 UNIT) PO CAPS
50000.0000 [IU] | ORAL_CAPSULE | ORAL | 0 refills | Status: DC
Start: 2020-07-14 — End: 2020-07-29

## 2020-07-14 MED ORDER — LISDEXAMFETAMINE DIMESYLATE 30 MG PO CAPS: 30.0000 mg | ORAL_CAPSULE | Freq: Every day | ORAL | 0 refills | Status: DC

## 2020-07-15 NOTE — Progress Notes (Signed)
Chief Complaint:   OBESITY Lauren Powell is here to discuss her progress with her obesity treatment plan along with follow-up of her obesity related diagnoses.   Today's visit was #: 15 Starting weight: 276 lbs Starting date: 08/07/2019 Today's weight: 278 lbs Today's date: 07/14/2020 Total lbs lost to date: +2 lbs Body mass index is 46.26 kg/m.   Interim History: Lauren Powell says she has had a difficult past 2 weeks at school.  Vyvanse helps her until after work. Bioimpedance shows increased muscle and decreased fat. Nutrition Plan: Category 3 Plan for 50% of the time. Activity: Walking for 20-25 minutes 4 times per week.  Assessment/Plan:   1. Visceral obesity Current visceral fat rating: 15. Visceral fat rating should be < 13. Visceral adipose tissue is a hormonally active component of total body fat. This body composition phenotype is associated with medical disorders such as metabolic syndrome, cardiovascular disease and several malignancies including prostate, breast, and colorectal cancers. Starting goal: Lose 7-10% of starting weight.   2. Vitamin D deficiency Not at goal. Current vitamin D is 26.0, tested on 02/27/2020. Optimal goal > 50 ng/dL.   Plan: Continue to take prescription Vitamin D @50 ,000 IU every week as prescribed.  Follow-up for routine testing of Vitamin D, at least 2-3 times per year to avoid over-replacement.  - Refill Vitamin D, Ergocalciferol, (DRISDOL) 1.25 MG (50000 UNIT) CAPS capsule; Take 1 capsule (50,000 Units total) by mouth every 7 (seven) days.  Dispense: 4 capsule; Refill: 0  3. Binge eating disorder People who binge eat feel as if they don't have control over how much they eat and have feelings of guilt or self-loathing after a binge eating episode. [employer] estimates that about 30 percent of adults with binge eating disorder also have a history of ADHD. The FDA has approved Vyvanse as a treatment option for both ADHD and binge eating. Vyvanse targets  the brain's reward center by increasing the levels of dopamine and norepinephrine, the chemicals of the brain responsible for feelings of pleasure. Mindful eating is the recommended nutritional approach to treating BED.   Plan:  Will increase Vyvanse dose to 30 mg daily, as per below.  - Increase/Refill lisdexamfetamine (VYVANSE) 30 MG capsule; Take 1 capsule (30 mg total) by mouth daily.  Dispense: 30 capsule; Refill: 0  I have consulted the [place] Controlled Substances Registry for this patient, and feel the risk/benefit ratio today is favorable for proceeding with this prescription for a controlled substance. The patient understands monitoring parameters and red flags.   4. At risk for deficient intake of food Charlie was given extensive education and counseling today of more than 8 minutes on risks associated with deficient food intake.  She is at risk due to increasing her Vyvanse dose.  Counseled her on the importance of following our prescribed meal plan and eating adequate amounts of protein.  Discussed that inadequate food intake over longer periods of time can slow metabolism down significantly.   5. Class 3 severe obesity with serious comorbidity and body mass index (BMI) of 45.0 to 49.9 in adult, unspecified obesity type (HCC)  Course: [name] is currently in the action stage of change. As such, her goal is to continue with weight loss efforts.   Nutrition goals: She has agreed to the Category 3 Plan.   Exercise goals: For substantial health benefits, adults should do at least 150 minutes (2 hours and 30 minutes) a week of moderate-intensity, or 75 minutes (1 hour and 15  minutes) a week of vigorous-intensity aerobic physical activity, or an equivalent combination of moderate- and vigorous-intensity aerobic activity. Aerobic activity should be performed in episodes of at least 10 minutes, and preferably, it should be spread throughout the week.  Behavioral modification strategies: increasing  lean protein intake, decreasing simple carbohydrates, increasing vegetables and increasing water intake.  Lauren Powell has agreed to follow-up with our clinic in 2-3 weeks. She was informed of the importance of frequent follow-up visits to maximize her success with intensive lifestyle modifications for her multiple health conditions.   Objective:   Blood pressure 119/82, pulse 75, temperature (!) 79.9 F (26.6 C), temperature source Oral, height 5\' 5"  (1.651 m), weight 278 lb (126.1 kg), SpO2 97 %. Body mass index is 46.26 kg/m.  General: Cooperative, alert, well developed, in no acute distress. HEENT: Conjunctivae and lids unremarkable. Cardiovascular: Regular rhythm.  Lungs: Normal work of breathing. Neurologic: No focal deficits.   Lab Results  Component Value Date   CREATININE 1.08 02/27/2020   BUN 9 02/27/2020   NA 141 02/27/2020   K 4.0 02/27/2020   CL 104 02/27/2020   CO2 29 02/27/2020   Lab Results  Component Value Date   ALT 9 02/27/2020   AST 11 02/27/2020   ALKPHOS 78 08/07/2019   BILITOT 0.4 02/27/2020   Lab Results  Component Value Date   HGBA1C 5.8 (H) 02/27/2020   HGBA1C 5.6 08/07/2019   HGBA1C 5.7 12/21/2018   HGBA1C 5.8 12/15/2017   HGBA1C 5.8 12/29/2016   Lab Results  Component Value Date   INSULIN 13.2 08/07/2019   Lab Results  Component Value Date   TSH 1.23 02/27/2020   Lab Results  Component Value Date   CHOL 153 02/27/2020   HDL 49 (L) 02/27/2020   LDLCALC 83 02/27/2020   TRIG 109 02/27/2020   CHOLHDL 3.1 02/27/2020   Lab Results  Component Value Date   WBC 8.5 02/27/2020   HGB 11.9 02/27/2020   HCT 37.4 02/27/2020   MCV 78.9 (L) 02/27/2020   PLT 362 02/27/2020   Lab Results  Component Value Date   IRON 43 08/07/2019   TIBC 316 08/07/2019   FERRITIN 25 08/07/2019   Attestation Statements:   Reviewed by clinician on day of visit: allergies, medications, problem list, medical history, surgical history, family history, social  history, and previous encounter notes.  I, 10/07/2019, CMA, am acting as transcriptionist for Insurance claims handler, DO  I have reviewed the above documentation for accuracy and completeness, and I agree with the above. Helane Rima, DO

## 2020-07-21 ENCOUNTER — Encounter (INDEPENDENT_AMBULATORY_CARE_PROVIDER_SITE_OTHER): Payer: Self-pay | Admitting: Family Medicine

## 2020-07-22 NOTE — Telephone Encounter (Signed)
Last OV with Dr Wallace 

## 2020-07-29 ENCOUNTER — Encounter (INDEPENDENT_AMBULATORY_CARE_PROVIDER_SITE_OTHER): Payer: Self-pay | Admitting: Family Medicine

## 2020-07-29 ENCOUNTER — Other Ambulatory Visit: Payer: Self-pay

## 2020-07-29 ENCOUNTER — Ambulatory Visit (INDEPENDENT_AMBULATORY_CARE_PROVIDER_SITE_OTHER): Payer: BC Managed Care – PPO | Admitting: Family Medicine

## 2020-07-29 VITALS — BP 119/76 | HR 68 | Temp 98.3°F | Ht 65.0 in | Wt 278.0 lb

## 2020-07-29 DIAGNOSIS — F5081 Binge eating disorder: Secondary | ICD-10-CM

## 2020-07-29 DIAGNOSIS — Z9189 Other specified personal risk factors, not elsewhere classified: Secondary | ICD-10-CM

## 2020-07-29 DIAGNOSIS — Z20822 Contact with and (suspected) exposure to covid-19: Secondary | ICD-10-CM

## 2020-07-29 DIAGNOSIS — E559 Vitamin D deficiency, unspecified: Secondary | ICD-10-CM | POA: Diagnosis not present

## 2020-07-29 DIAGNOSIS — E8881 Metabolic syndrome: Secondary | ICD-10-CM

## 2020-07-29 DIAGNOSIS — R7303 Prediabetes: Secondary | ICD-10-CM

## 2020-07-29 DIAGNOSIS — Z6841 Body Mass Index (BMI) 40.0 and over, adult: Secondary | ICD-10-CM

## 2020-07-29 MED ORDER — VITAMIN D (ERGOCALCIFEROL) 1.25 MG (50000 UNIT) PO CAPS: 50000.0000 [IU] | ORAL_CAPSULE | ORAL | 0 refills | Status: DC

## 2020-07-29 MED ORDER — METFORMIN HCL 500 MG PO TABS: 500.0000 mg | ORAL_TABLET | Freq: Every day | ORAL | 0 refills | Status: DC

## 2020-07-29 MED ORDER — RYBELSUS 3 MG PO TABS: 1.0000 | ORAL_TABLET | Freq: Every day | ORAL | 0 refills | Status: DC

## 2020-07-29 NOTE — Progress Notes (Signed)
Chief Complaint:   OBESITY Lauren Powell is here to discuss her progress with her obesity treatment plan along with follow-up of her obesity related diagnoses.   Today's visit was #: 16 Starting weight: 276 lbs Starting date: 08/07/2019 Today's weight: 278 lbs Today's date: 07/29/2020 Total lbs lost to date: 0  Interim History: [name] will increase her weight training to 2 days per week.  She will come fasting for labs at her next visit.  Nutrition Plan: Category 3 Plan for 50% of the time. Activity: Walking for 30 minutes 3 times per week.  Assessment/Plan:   1. Vitamin D deficiency Not at goal. Current vitamin D is 26.0, tested on 02/27/2020. Optimal goal > 50 ng/dL.   Plan: Continue to take prescription Vitamin D @50 ,000 IU every week as prescribed.  Follow-up for routine testing of Vitamin D, at least 2-3 times per year to avoid over-replacement.  - Refill Vitamin D, Ergocalciferol, (DRISDOL) 1.25 MG (50000 UNIT) CAPS capsule; Take 1 capsule (50,000 Units total) by mouth every 7 (seven) days.  Dispense: 4 capsule; Refill: 0  2. Prediabetes Not at goal. Goal is HgbA1c < 5.7.  Medication: metformin 500 mg daily.    Plan:  She will continue to focus on protein-rich, low simple carbohydrate foods. We reviewed the importance of hydration, regular exercise for stress reduction, and restorative sleep.   Lab Results  Component Value Date   HGBA1C 5.8 (H) 02/27/2020   Lab Results  Component Value Date   INSULIN 13.2 08/07/2019   - Refill metFORMIN (GLUCOPHAGE) 500 MG tablet; Take 1 tablet (500 mg total) by mouth daily.  Dispense: 90 tablet; Refill: 0  3. Metabolic syndrome Starting goal: Lose 7-10% of starting weight. She will continue to focus on protein-rich, low simple carbohydrate foods. We reviewed the importance of hydration, regular exercise for stress reduction, and restorative sleep.  We will continue to check lab work every 3 months, with 10% weight loss, or should any other  concerns arise.  Will start Rybelsus 3 mg daily, as per below.  - Start Semaglutide (RYBELSUS) 3 MG TABS; Take 1 tablet by mouth daily.  Dispense: 30 tablet; Refill: 0  4. Exposure to COVID-19 virus We will continue to monitor.   5. Binge eating disorder People who binge eat feel as if they don't have control over how much they eat and have feelings of guilt or self-loathing after a binge eating episode. [employer] estimates that about 30 percent of adults with binge eating disorder also have a history of ADHD. The FDA has approved Vyvanse as a treatment option for both ADHD and binge eating. Vyvanse targets the brain's reward center by increasing the levels of dopamine and norepinephrine, the chemicals of the brain responsible for feelings of pleasure. Mindful eating is the recommended nutritional approach to treating BED.   Plan:  Continue Vyvanse 30 mg daily.  I have consulted the [place] Controlled Substances Registry for this patient, and feel the risk/benefit ratio today is favorable for proceeding with this prescription for a controlled substance. The patient understands monitoring parameters and red flags.   6. At risk for diabetes mellitus [name] was given diabetes prevention education and counseling today of more than 8 minutes. She will continue to focus on protein-rich, low simple carbohydrate foods. We reviewed the importance of hydration, regular exercise for stress reduction, and restorative sleep.   7. Class 3 severe obesity with serious comorbidity and body mass index (BMI) of 45.0 to 49.9 in adult,  unspecified obesity type [facility])  Course: [name] is currently in the action stage of change. As such, her goal is to continue with weight loss efforts.   Nutrition goals: She has agreed to the Category 3 Plan.   Exercise goals: For substantial health benefits, adults should do at least 150 minutes (2 hours and 30 minutes) a week of moderate-intensity, or 75 minutes (1 hour and 15  minutes) a week of vigorous-intensity aerobic physical activity, or an equivalent combination of moderate- and vigorous-intensity aerobic activity. Aerobic activity should be performed in episodes of at least 10 minutes, and preferably, it should be spread throughout the week.  Behavioral modification strategies: increasing lean protein intake, decreasing simple carbohydrates, increasing vegetables and increasing water intake.  [name] has agreed to follow-up with our clinic in 3 weeks. She was informed of the importance of frequent follow-up visits to maximize her success with intensive lifestyle modifications for her multiple health conditions.   Objective:   There were no vitals taken for this visit. There is no height or weight on file to calculate BMI.  General: Cooperative, alert, well developed, in no acute distress. HEENT: Conjunctivae and lids unremarkable. Cardiovascular: Regular rhythm.  Lungs: Normal work of breathing. Neurologic: No focal deficits.   Lab Results  Component Value Date   CREATININE 1.08 02/27/2020   BUN 9 02/27/2020   NA 141 02/27/2020   K 4.0 02/27/2020   CL 104 02/27/2020   CO2 29 02/27/2020   Lab Results  Component Value Date   ALT 9 02/27/2020   AST 11 02/27/2020   ALKPHOS 78 08/07/2019   BILITOT 0.4 02/27/2020   Lab Results  Component Value Date   HGBA1C 5.8 (H) 02/27/2020   HGBA1C 5.6 08/07/2019   HGBA1C 5.7 12/21/2018   HGBA1C 5.8 12/15/2017   HGBA1C 5.8 12/29/2016   Lab Results  Component Value Date   INSULIN 13.2 08/07/2019   Lab Results  Component Value Date   TSH 1.23 02/27/2020   Lab Results  Component Value Date   CHOL 153 02/27/2020   HDL 49 (L) 02/27/2020   LDLCALC 83 02/27/2020   TRIG 109 02/27/2020   CHOLHDL 3.1 02/27/2020   Lab Results  Component Value Date   WBC 8.5 02/27/2020   HGB 11.9 02/27/2020   HCT 37.4 02/27/2020   MCV 78.9 (L) 02/27/2020   PLT 362 02/27/2020   Lab Results  Component Value Date    IRON 43 08/07/2019   TIBC 316 08/07/2019   FERRITIN 25 08/07/2019   Attestation Statements:   Reviewed by clinician on day of visit: allergies, medications, problem list, medical history, surgical history, family history, social history, and previous encounter notes.  I, Insurance claims handler, CMA, am acting as transcriptionist for Helane Rima, DO  I have reviewed the above documentation for accuracy and completeness, and I agree with the above. Helane Rima, DO

## 2020-08-12 ENCOUNTER — Ambulatory Visit (INDEPENDENT_AMBULATORY_CARE_PROVIDER_SITE_OTHER): Payer: BC Managed Care – PPO | Admitting: Family Medicine

## 2020-08-18 ENCOUNTER — Encounter (INDEPENDENT_AMBULATORY_CARE_PROVIDER_SITE_OTHER): Payer: Self-pay | Admitting: Family Medicine

## 2020-08-18 ENCOUNTER — Other Ambulatory Visit: Payer: Self-pay

## 2020-08-18 ENCOUNTER — Ambulatory Visit (INDEPENDENT_AMBULATORY_CARE_PROVIDER_SITE_OTHER): Payer: BC Managed Care – PPO | Admitting: Family Medicine

## 2020-08-18 VITALS — BP 118/74 | HR 73 | Temp 98.0°F | Ht 65.0 in | Wt 277.0 lb

## 2020-08-18 DIAGNOSIS — E559 Vitamin D deficiency, unspecified: Secondary | ICD-10-CM

## 2020-08-18 DIAGNOSIS — R7303 Prediabetes: Secondary | ICD-10-CM | POA: Diagnosis not present

## 2020-08-18 DIAGNOSIS — E8881 Metabolic syndrome: Secondary | ICD-10-CM | POA: Diagnosis not present

## 2020-08-18 DIAGNOSIS — Z9189 Other specified personal risk factors, not elsewhere classified: Secondary | ICD-10-CM

## 2020-08-18 DIAGNOSIS — Z6841 Body Mass Index (BMI) 40.0 and over, adult: Secondary | ICD-10-CM

## 2020-08-18 DIAGNOSIS — F439 Reaction to severe stress, unspecified: Secondary | ICD-10-CM

## 2020-08-18 DIAGNOSIS — F5081 Binge eating disorder: Secondary | ICD-10-CM

## 2020-08-19 ENCOUNTER — Ambulatory Visit: Payer: BC Managed Care – PPO | Admitting: Adult Health

## 2020-08-19 ENCOUNTER — Encounter: Payer: Self-pay | Admitting: Adult Health

## 2020-08-19 ENCOUNTER — Encounter (INDEPENDENT_AMBULATORY_CARE_PROVIDER_SITE_OTHER): Payer: Self-pay | Admitting: Family Medicine

## 2020-08-19 VITALS — BP 120/90 | HR 79 | Temp 98.3°F

## 2020-08-19 DIAGNOSIS — E559 Vitamin D deficiency, unspecified: Secondary | ICD-10-CM

## 2020-08-19 DIAGNOSIS — L309 Dermatitis, unspecified: Secondary | ICD-10-CM | POA: Diagnosis not present

## 2020-08-19 MED ORDER — TRIAMCINOLONE ACETONIDE 0.1 % EX CREA: 1.0000 "application " | TOPICAL_CREAM | Freq: Two times a day (BID) | CUTANEOUS | 1 refills | Status: DC

## 2020-08-19 NOTE — Progress Notes (Signed)
Subjective:    Patient ID: Lauren Powell, female    DOB: Oct 21, 1985, 35 y.o.   MRN: [id number]  HPI   35 year old female who  has a past medical history of Allergy, Eczema, Female infertility, History of anemia, Obesity, and Vitamin D deficiency.  She presents to the office today for an acute issue.  Symptoms have been present for the last 2 to 4 weeks.  Symptoms include itching ( sometimes intense)  under bilateral breasts as well as on her sternum.  Itching started under the right breast and then spread to sternal area as well as left breast.  She denies any drainage.  Has been using over-the-counter cortisone cream with some improvement in itching.  She does have a history of eczema  Review of Systems See HPI   Past Medical History:  Diagnosis Date  . Allergy   . Eczema   . Female infertility   . History of anemia   . Obesity   . Vitamin D deficiency     Social History   Socioeconomic History  . Marital status: Divorced    Spouse name: Not on file  . Number of children: Not on file  . Years of education: Not on file  . Highest education level: Not on file  Occupational History  . Occupation: Midwife  Tobacco Use  . Smoking status: Never Smoker  . Smokeless tobacco: Never Used  Vaping Use  . Vaping Use: Never used  Substance and Sexual Activity  . Alcohol use: No  . Drug use: No  . Sexual activity: Yes    Partners: Male    Birth control/protection: None  Other Topics Concern  . Not on file  Social History Narrative   Patient lives with son   Teaches pre school and kindergarten.    Social Determinants of Health   Financial Resource Strain: Not on file  Food Insecurity: Not on file  Transportation Needs: Not on file  Physical Activity: Not on file  Stress: Not on file  Social Connections: Not on file  Intimate Partner Violence: Not on file    Past Surgical History:  Procedure Laterality Date  . CESAREAN SECTION    . DILATATION &  CURETTAGE/HYSTEROSCOPY WITH MYOSURE N/A 08/06/2019   Procedure: DILATATION & CURETTAGE/HYSTEROSCOPY WITH MYOSURE;  Surgeon: Maxie Better, MD;  Location: [facility];  Service: Gynecology;  Laterality: N/A;  . TONSILLECTOMY  04/30/2019  . TOOTH EXTRACTION N/A     Family History  Problem Relation Age of Onset  . Alcohol abuse Mother   . Drug abuse Mother   . Bipolar disorder Mother   . Depression Mother   . Hypertension Paternal Aunt   . Hypertension Paternal Grandmother   . Prostate cancer Paternal Grandfather     No Known Allergies  Current Outpatient Medications on File Prior to Visit  Medication Sig Dispense Refill  . azelastine (ASTELIN) 0.1 % nasal spray Place 2 sprays into both nostrils 2 (two) times daily. Use in each nostril as directed 30 mL 3  . buPROPion (WELLBUTRIN XL) 150 MG 24 hr tablet Take 1 tablet (150 mg total) by mouth daily. 90 tablet 0  . lisdexamfetamine (VYVANSE) 30 MG capsule Take 1 capsule (30 mg total) by mouth daily. 30 capsule 0  . meloxicam (MOBIC) 15 MG tablet Take 1 tablet (15 mg total) by mouth daily. 30 tablet 1  . metFORMIN (GLUCOPHAGE) 500 MG tablet Take 1 tablet (500 mg total) by mouth daily.  90 tablet 0  . montelukast (SINGULAIR) 10 MG tablet TAKE 1 TABLET BY MOUTH EVERYDAY AT BEDTIME 30 tablet 1  . Olopatadine HCl 0.2 % SOLN INSTILL 1 DROP INTO AFFECTED EYE EVERY DAY    . Semaglutide (RYBELSUS) 3 MG TABS Take 1 tablet by mouth daily. 30 tablet 0  . Vitamin D, Ergocalciferol, (DRISDOL) 1.25 MG (50000 UNIT) CAPS capsule Take 1 capsule (50,000 Units total) by mouth every 7 (seven) days. 4 capsule 0   No current facility-administered medications on file prior to visit.    BP 120/90 (BP Location: Right Arm, Patient Position: Sitting, Cuff Size: Normal)   Pulse 79   Temp 98.3 F (36.8 C) (Oral)   SpO2 99%       Objective:   Physical Exam Vitals and nursing note reviewed.  Constitutional:      Appearance: Normal  appearance.  Skin:    General: Skin is warm and dry.     Findings: Erythema present. No rash.     Comments: No apparent rash noted under either breast.  She has some mild erythema located on her sternum with a few pinpoint blisters.   Neurological:     General: No focal deficit present.     Mental Status: She is alert and oriented to person, place, and time.  Psychiatric:        Mood and Affect: Mood normal.        Behavior: Behavior normal.        Thought Content: Thought content normal.        Judgment: Judgment normal.       Assessment & Plan:  1. Eczema, unspecified type -Send in Kenalog 0.1% to use twice daily over the next week.  Also apply good lotion to the areas. - triamcinolone (KENALOG) 0.1 %; Apply 1 application topically 2 (two) times daily.  Dispense: 30 g; Refill: 1 - Follow up if not resolves   Shirline Frees, NP

## 2020-08-20 MED ORDER — VITAMIN D (ERGOCALCIFEROL) 1.25 MG (50000 UNIT) PO CAPS: 50000.0000 [IU] | ORAL_CAPSULE | ORAL | 0 refills | Status: DC

## 2020-08-21 NOTE — Progress Notes (Signed)
Chief Complaint:   OBESITY Lauren Powell is here to discuss her progress with her obesity treatment plan along with follow-up of her obesity related diagnoses.   Today's visit was #: 17 Starting weight: 276 lbs Starting date: 08/07/2019 Today's weight: 277 lbs Today's date: 08/18/2020 Total lbs lost to date: +1 pound Body mass index is 46.1 kg/m.   Interim History:  Braxton's stress levels have continued to increase. She has one child in her class with consistent behavioral problems.  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  She denies polyphagia.  She is not eating during the day (feels that she does not have time).  She eats breakfast and a late dinner.  Current Meal Plan: the Category 3 Plan for 50% of the time.  Current Exercise Plan: Walking for 20-25 minutes 2 times per week. Current Anti-Obesity Medications: Rybelsus 3 mg daily. Side effects: None.  Assessment/Plan:   1. Metabolic syndrome Lauren Powell is taking Rybelsus 3 mg daily.  Goal will be to increase the dosage once she is eating more regularly.  Starting goal: Lose 7-10% of starting weight. She will continue to focus on protein-rich, low simple carbohydrate foods. We reviewed the importance of hydration, regular exercise for stress reduction, and restorative sleep.  We will continue to check lab work every 3 months, with 10% weight loss, or should any other concerns arise.  2. Prediabetes Not at goal. Goal is HgbA1c < 5.7.  Medication: metformin 500 mg daily.    Plan:  She will continue to focus on protein-rich, low simple carbohydrate foods. We reviewed the importance of hydration, regular exercise for stress reduction, and restorative sleep.   Lab Results  Component Value Date   HGBA1C 5.8 (H) 02/27/2020   3. Vitamin D deficiency Not at goal. Current vitamin D is 26.0, tested on 02/26/2021. Optimal goal > 50 ng/dL.   Plan: Continue to take prescription Vitamin D @50 ,000 IU every week as  prescribed.  Follow-up for routine testing of Vitamin D, at least 2-3 times per year to avoid over-replacement.  4. Situational stress Shakura is under increased stress at work.Discussed cues and consequences, how thoughts affect eating, model of thoughts, feelings, and behaviors, and strategies for change by focusing on the cue. Discussed cognitive distortions, coping thoughts, and how to change your thoughts.  5. Binge eating disorder Lauren Powell takes Vyvanse 30 mg daily and Wellbutrin XL 150 mg daily for BED. Plan:  Continue Vyvanse.  People who binge eat feel as if they don't have control over how much they eat and have feelings of guilt or self-loathing after a binge eating episode. [employer] estimates that about 30 percent of adults with binge eating disorder also have a history of ADHD. The FDA has approved Vyvanse as a treatment option for both ADHD and binge eating. Vyvanse targets the brain's reward center by increasing the levels of dopamine and norepinephrine, the chemicals of the brain responsible for feelings of pleasure. Mindful eating is the recommended nutritional approach to treating BED.   I have consulted the [place] Controlled Substances Registry for this patient, and feel the risk/benefit ratio today is favorable for proceeding with this prescription for a controlled substance. The patient understands monitoring parameters and red flags.   6. At risk for anxiety Lauren Powell was given approximately 10 minutes of anxiety risk counseling today. The patient has risk factors for this condition.  We discussed the importance of a healthy work/life balance, a  healthy relationship with food, and a good support system.  We discussed various strategies to help cope with these emotions as well.  I recommended counseling, meditation / prayer, healthy eating habits, sleep hygiene, and exercising to help manage these feelings.   7. Class 3 severe obesity with serious comorbidity and body  mass index (BMI) of 45.0 to 49.9 in adult, unspecified obesity type (HCC)  Course: Lauren Powell is currently in the action stage of change. As such, her goal is to continue with weight loss efforts.   Nutrition goals: She has agreed to the Category 3 Plan.   Exercise goals: For substantial health benefits, adults should do at least 150 minutes (2 hours and 30 minutes) a week of moderate-intensity, or 75 minutes (1 hour and 15 minutes) a week of vigorous-intensity aerobic physical activity, or an equivalent combination of moderate- and vigorous-intensity aerobic activity. Aerobic activity should be performed in episodes of at least 10 minutes, and preferably, it should be spread throughout the week.  Behavioral modification strategies: increasing lean protein intake, decreasing simple carbohydrates, increasing vegetables, increasing water intake and no skipping meals.  Lauren Powell has agreed to follow-up with our clinic in 2-3 weeks. She was informed of the importance of frequent follow-up visits to maximize her success with intensive lifestyle modifications for her multiple health conditions.   Objective:   Blood pressure 118/74, pulse 73, temperature 98 F (36.7 C), temperature source Oral, height 5\' 5"  (1.651 m), weight 277 lb (125.6 kg), SpO2 99 %. Body mass index is 46.1 kg/m.  General: Cooperative, alert, well developed, in no acute distress. HEENT: Conjunctivae and lids unremarkable. Cardiovascular: Regular rhythm.  Lungs: Normal work of breathing. Neurologic: No focal deficits.   Lab Results  Component Value Date   HGBA1C 5.8 (H) 02/27/2020   HGBA1C 5.6 08/07/2019   HGBA1C 5.7 12/21/2018   HGBA1C 5.8 12/15/2017   Lab Results  Component Value Date   INSULIN 13.2 08/07/2019   Lab Results  Component Value Date   TSH 1.23 02/27/2020   Lab Results  Component Value Date   HGB 11.9 02/27/2020   MCV 78.9 (L) 02/27/2020   PLT 362 02/27/2020   Attestation Statements:   Reviewed by  clinician on day of visit: allergies, medications, problem list, medical history, surgical history, family history, social history, and previous encounter notes.  I, 02/29/2020, CMA, am acting as transcriptionist for Insurance claims handler, DO  I have reviewed the above documentation for accuracy and completeness, and I agree with the above. Helane Rima, DO

## 2020-08-22 LAB — ANEMIA PANEL
Ferritin: 37 ng/mL (ref 15–150)
Folate, Hemolysate: 391 ng/mL
Iron Saturation: 11 % — ABNORMAL LOW (ref 15–55)
Iron: 33 ug/dL (ref 27–159)
Total Iron Binding Capacity: 310 ug/dL (ref 250–450)
UIBC: 277 ug/dL (ref 131–425)
Vitamin B-12: 573 pg/mL (ref 232–1245)

## 2020-08-22 LAB — COMPREHENSIVE METABOLIC PANEL
ALT: 11 IU/L (ref 0–32)
AST: 13 IU/L (ref 0–40)
Albumin/Globulin Ratio: 1.5 (ref 1.2–2.2)
Albumin: 4.2 g/dL (ref 3.8–4.8)
Alkaline Phosphatase: 74 IU/L (ref 44–121)
BUN/Creatinine Ratio: 9 (ref 9–23)
BUN: 7 mg/dL (ref 6–20)
Bilirubin Total: 0.5 mg/dL (ref 0.0–1.2)
CO2: 23 mmol/L (ref 20–29)
Calcium: 9 mg/dL (ref 8.7–10.2)
Chloride: 103 mmol/L (ref 96–106)
Creatinine, Ser: 0.82 mg/dL (ref 0.57–1.00)
Globulin, Total: 2.8 g/dL (ref 1.5–4.5)
Glucose: 86 mg/dL (ref 65–99)
Potassium: 4.1 mmol/L (ref 3.5–5.2)
Sodium: 140 mmol/L (ref 134–144)
Total Protein: 7 g/dL (ref 6.0–8.5)
eGFR: 96 mL/min/{1.73_m2} (ref >59)

## 2020-08-22 LAB — VITAMIN D 25 HYDROXY (VIT D DEFICIENCY, FRACTURES): Vit D, 25-Hydroxy: 20.3 ng/mL — ABNORMAL LOW (ref 30.0–100.0)

## 2020-08-22 LAB — HEMOGLOBIN A1C
Est. average glucose Bld gHb Est-mCnc: 114 mg/dL
Hgb A1c MFr Bld: 5.6 % (ref 4.8–5.6)

## 2020-08-22 LAB — LIPID PANEL
Chol/HDL Ratio: 3 ratio (ref 0.0–4.4)
Cholesterol, Total: 164 mg/dL (ref 100–199)
HDL: 55 mg/dL (ref >39)
LDL Chol Calc (NIH): 95 mg/dL (ref 0–99)
Triglycerides: 71 mg/dL (ref 0–149)
VLDL Cholesterol Cal: 14 mg/dL (ref 5–40)

## 2020-08-22 LAB — CBC WITH DIFFERENTIAL/PLATELET

## 2020-09-01 ENCOUNTER — Encounter (INDEPENDENT_AMBULATORY_CARE_PROVIDER_SITE_OTHER): Payer: Self-pay | Admitting: Family Medicine

## 2020-09-01 ENCOUNTER — Ambulatory Visit (INDEPENDENT_AMBULATORY_CARE_PROVIDER_SITE_OTHER): Payer: BC Managed Care – PPO | Admitting: Family Medicine

## 2020-09-01 VITALS — BP 120/87 | HR 91 | Temp 98.0°F | Ht 65.0 in | Wt 277.0 lb

## 2020-09-01 DIAGNOSIS — E8881 Metabolic syndrome: Secondary | ICD-10-CM

## 2020-09-01 DIAGNOSIS — F5081 Binge eating disorder: Secondary | ICD-10-CM | POA: Diagnosis not present

## 2020-09-01 DIAGNOSIS — Z6841 Body Mass Index (BMI) 40.0 and over, adult: Secondary | ICD-10-CM

## 2020-09-01 DIAGNOSIS — Z9189 Other specified personal risk factors, not elsewhere classified: Secondary | ICD-10-CM

## 2020-09-01 DIAGNOSIS — F439 Reaction to severe stress, unspecified: Secondary | ICD-10-CM

## 2020-09-08 MED ORDER — RYBELSUS 7 MG PO TABS: 7.0000 mg | ORAL_TABLET | Freq: Every day | ORAL | 0 refills | Status: DC

## 2020-09-08 MED ORDER — RYBELSUS 3 MG PO TABS
1.0000 | ORAL_TABLET | Freq: Every day | ORAL | 0 refills | Status: DC
Start: 2020-09-08 — End: 2020-09-08

## 2020-09-08 MED ORDER — LISDEXAMFETAMINE DIMESYLATE 30 MG PO CAPS: 30.0000 mg | ORAL_CAPSULE | Freq: Every day | ORAL | 0 refills | Status: DC

## 2020-09-08 NOTE — Progress Notes (Signed)
Chief Complaint:   OBESITY Lauren Powell is here to discuss her progress with her obesity treatment plan along with follow-up of her obesity related diagnoses.   Today's visit was #: 18 Starting weight: 276 lbs Starting date: 08/07/2019 Today's weight: 277 lbs Today's date: 09/01/2020 Total lbs lost to date: +1 Body mass index is 46.1 kg/m.   Interim History:  Lauren Powell says she "partied for 4 days".  "My baby turned 89." Current Meal Plan: the Category 3 Plan for 50% of the time.  Current Exercise Plan: Strength training/cardio for 45 minutes 2 times per week. Current Anti-Obesity Medications: Rybelsus 3 mg daily, metformin 500 mg daily. Side effects: None.  Assessment/Plan:   1. Metabolic syndrome [name] is taking Rybelsus 3 mg daily.  Starting goal: Lose 7-10% of starting weight. She will continue to focus on protein-rich, low simple carbohydrate foods. We reviewed the importance of hydration, regular exercise for stress reduction, and restorative sleep.  We will continue to check lab work every 3 months, with 10% weight loss, or should any other concerns arise.  Plan:  Increase Rybelsus to 7 mg daily.  Continue metformin 500 mg daily.  2. Situational stress Lauren Powell is under increased stress at work.  Discussed cues and consequences, how thoughts affect eating, model of thoughts, feelings, and behaviors, and strategies for change by focusing on the cue.  Discussed cognitive distortions, coping thoughts, and how to change your thoughts.  3. Binge eating disorder Lauren Powell is taking Vyvanse 30 mg daily and Wellbutrin XL 150 mg daily for BED.  Plan:  Continue Vyvanse 30 mg daily and Wellbutrin XL 150 mg daily.  Will refill Vyvanse today, as per below.  People who binge eat feel as if they don't have control over how much they eat and have feelings of guilt or self-loathing after a binge eating episode. [employer] estimates that about 30 percent of adults with binge eating disorder also have  a history of ADHD. The FDA has approved Vyvanse as a treatment option for both ADHD and binge eating. Vyvanse targets the brain's reward center by increasing the levels of dopamine and norepinephrine, the chemicals of the brain responsible for feelings of pleasure. Mindful eating is the recommended nutritional approach to treating BED.   I have consulted the [place] Controlled Substances Registry for this patient, and feel the risk/benefit ratio today is favorable for proceeding with this prescription for a controlled substance. The patient understands monitoring parameters and red flags.   - Refill lisdexamfetamine (VYVANSE) 30 MG capsule; Take 1 capsule (30 mg total) by mouth daily.  Dispense: 30 capsule; Refill: 0  4. At risk for nausea Lauren Powell is at risk for nausea due to increasing Rybelsus. We reviewed ways to decrease side effect of nausea through eating small meals, avoiding high sugar or fat foods, and ultimately going back on the dose if not tolerating. Time, 9 min.  5. Obesity, current BMI 46.2  Course: Lauren Powell is currently in the action stage of change. As such, her goal is to continue with weight loss efforts.   Nutrition goals: She has agreed to the Category 3 Plan.   Exercise goals: For substantial health benefits, adults should do at least 150 minutes (2 hours and 30 minutes) a week of moderate-intensity, or 75 minutes (1 hour and 15 minutes) a week of vigorous-intensity aerobic physical activity, or an equivalent combination of moderate- and vigorous-intensity aerobic activity. Aerobic activity should be performed in episodes of at least 10 minutes, and preferably,  it should be spread throughout the week.  Behavioral modification strategies: increasing lean protein intake, decreasing simple carbohydrates, increasing vegetables, increasing water intake and decreasing liquid calories.  Lauren Powell has agreed to follow-up with our clinic in 3 weeks. She was informed of the importance of frequent  follow-up visits to maximize her success with intensive lifestyle modifications for her multiple health conditions.   Objective:   Blood pressure 120/87, pulse 91, temperature 98 F (36.7 C), temperature source Oral, height 5\' 5"  (1.651 m), weight 277 lb (125.6 kg), SpO2 97 %. Body mass index is 46.1 kg/m.  General: Cooperative, alert, well developed, in no acute distress. HEENT: Conjunctivae and lids unremarkable. Cardiovascular: Regular rhythm.  Lungs: Normal work of breathing. Neurologic: No focal deficits.   Lab Results  Component Value Date   CREATININE 0.82 08/18/2020   BUN 7 08/18/2020   NA 140 08/18/2020   K 4.1 08/18/2020   CL 103 08/18/2020   CO2 23 08/18/2020   Lab Results  Component Value Date   ALT 11 08/18/2020   AST 13 08/18/2020   ALKPHOS 74 08/18/2020   BILITOT 0.5 08/18/2020   Lab Results  Component Value Date   HGBA1C 5.6 08/18/2020   HGBA1C 5.8 (H) 02/27/2020   HGBA1C 5.6 08/07/2019   HGBA1C 5.7 12/21/2018   HGBA1C 5.8 12/15/2017   Lab Results  Component Value Date   INSULIN 13.2 08/07/2019   Lab Results  Component Value Date   TSH 1.23 02/27/2020   Lab Results  Component Value Date   CHOL 164 08/18/2020   HDL 55 08/18/2020   LDLCALC 95 08/18/2020   TRIG 71 08/18/2020   CHOLHDL 3.0 08/18/2020   Lab Results  Component Value Date   WBC CANCELED 08/18/2020   HGB 11.9 02/27/2020   HCT CANCELED 08/18/2020   MCV 78.9 (L) 02/27/2020   PLT 362 02/27/2020   Lab Results  Component Value Date   IRON 33 08/18/2020   TIBC 310 08/18/2020   FERRITIN 37 08/18/2020   Attestation Statements:   Reviewed by clinician on day of visit: allergies, medications, problem list, medical history, surgical history, family history, social history, and previous encounter notes.  I, 08/20/2020, CMA, am acting as transcriptionist for Insurance claims handler, DO  I have reviewed the above documentation for accuracy and completeness, and I agree with the above. Helane Rima, DO

## 2020-09-17 ENCOUNTER — Other Ambulatory Visit: Payer: Self-pay | Admitting: Adult Health

## 2020-09-17 DIAGNOSIS — F321 Major depressive disorder, single episode, moderate: Secondary | ICD-10-CM

## 2020-09-22 ENCOUNTER — Ambulatory Visit (INDEPENDENT_AMBULATORY_CARE_PROVIDER_SITE_OTHER): Payer: BC Managed Care – PPO | Admitting: Family Medicine

## 2020-09-29 ENCOUNTER — Ambulatory Visit (INDEPENDENT_AMBULATORY_CARE_PROVIDER_SITE_OTHER): Payer: BC Managed Care – PPO | Admitting: Family Medicine

## 2020-09-29 ENCOUNTER — Encounter (INDEPENDENT_AMBULATORY_CARE_PROVIDER_SITE_OTHER): Payer: Self-pay | Admitting: Family Medicine

## 2020-09-29 ENCOUNTER — Other Ambulatory Visit: Payer: Self-pay

## 2020-09-29 VITALS — BP 120/83 | HR 80 | Temp 97.9°F | Ht 65.0 in | Wt 282.0 lb

## 2020-09-29 DIAGNOSIS — E8881 Metabolic syndrome: Secondary | ICD-10-CM

## 2020-09-29 DIAGNOSIS — Z9189 Other specified personal risk factors, not elsewhere classified: Secondary | ICD-10-CM | POA: Diagnosis not present

## 2020-09-29 DIAGNOSIS — F5081 Binge eating disorder: Secondary | ICD-10-CM | POA: Diagnosis not present

## 2020-09-29 DIAGNOSIS — E65 Localized adiposity: Secondary | ICD-10-CM

## 2020-09-29 DIAGNOSIS — Z6841 Body Mass Index (BMI) 40.0 and over, adult: Secondary | ICD-10-CM

## 2020-09-30 NOTE — Progress Notes (Signed)
Chief Complaint:   OBESITY Nyra is here to discuss her progress with her obesity treatment plan along with follow-up of her obesity related diagnoses.   Today's visit was #: 19 Starting weight: 276 lbs Starting date: 08/07/2019 Today's weight: 282 lbs Today's date: 09/29/2020 Total lbs lost to date: +6 Body mass index is 46.93 kg/m.   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 She will be working this summer.   Current Meal Plan: practicing portion control and making smarter food choices, such as increasing vegetables and decreasing simple carbohydrates.  Current Exercise Plan: Swimming/walking for 30 minutes 3 times per week. Current Anti-Obesity Medications: Rybelsus 7 mg daily. Side effects: None.  Assessment/Plan:   1. Metabolic syndrome Starting goal: Lose 7-10% of starting weight. She will continue to focus on protein-rich, low simple carbohydrate foods. We reviewed the importance of hydration, regular exercise for stress reduction, and restorative sleep.  We will continue to check lab work every 3 months, with 10% weight loss, or should any other concerns arise.  2. Visceral obesity Current visceral fat rating: 14. Visceral fat rating should be < 13. Visceral adipose tissue is a hormonally active component of total body fat. This body composition phenotype is associated with medical disorders such as metabolic syndrome, cardiovascular disease and several malignancies including prostate, breast, and colorectal cancers. Starting goal: Lose 7-10% of starting weight.   3. Binge eating disorder Improving. She will continue to focus on protein-rich, low simple carbohydrate foods. We reviewed the importance of hydration, regular exercise for stress reduction, and restorative sleep.   People who binge eat feel as if they don't have control over how much they eat and have feelings of guilt or  self-loathing after a binge eating episode. [employer] estimates that about 30 percent of adults with binge eating disorder also have a history of ADHD. The FDA has approved Vyvanse as a treatment option for both ADHD and binge eating. Vyvanse targets the brain's reward center by increasing the levels of dopamine and norepinephrine, the chemicals of the brain responsible for feelings of pleasure. Mindful eating is the recommended nutritional approach to treating BED.   4. At risk for heart disease Due to Avaree's current state of health and medical condition(s), she is at a higher risk for heart disease.  This puts the patient at much greater risk to subsequently develop cardiopulmonary conditions that can significantly affect patient's quality of life in a negative manner.    At least 8 minutes were spent on counseling Romanda about these concerns today, and I stressed the importance of reversing risks factors of obesity, especially truncal and visceral fat, hypertension, hyperlipidemia, and pre-diabetes.  The initial goal is to lose at least 5-10% of starting weight to help reduce these risk factors. Evidence-based interventions for health behavior change were utilized today including the discussion of self monitoring techniques, problem-solving barriers, and SMART goal setting techniques.  Specifically, regarding patient's less desirable eating habits and patterns, we employed the technique of small changes when Peighton has not been able to fully commit to her prudent nutritional plan.  5. Obesity, current BMI 46.9  Course: Quaneshia is currently in the action stage of change. As such, her goal is to continue with weight loss efforts.   Nutrition goals: She has agreed to practicing portion control and making smarter food choices, such as increasing vegetables and decreasing simple carbohydrates.   Exercise goals:  For substantial health benefits, adults should do at least 150 minutes (2 hours and 30  minutes) a week of moderate-intensity, or 75 minutes (1 hour and 15 minutes) a week of vigorous-intensity aerobic physical activity, or an equivalent combination of moderate- and vigorous-intensity aerobic activity. Aerobic activity should be performed in episodes of at least 10 minutes, and preferably, it should be spread throughout the week. Adults should also include muscle-strengthening activities that involve all major muscle groups on 2 or more days a week.  Behavioral modification strategies: increasing lean protein intake, decreasing simple carbohydrates, increasing vegetables and increasing water intake.  Sherran has agreed to follow-up with our clinic in 2 weeks. She was informed of the importance of frequent follow-up visits to maximize her success with intensive lifestyle modifications for her multiple health conditions.   Objective:   Blood pressure 120/83, pulse 80, temperature 97.9 F (36.6 C), temperature source Oral, height 5\' 5"  (1.651 m), weight 282 lb (127.9 kg), SpO2 98 %. Body mass index is 46.93 kg/m.  General: Cooperative, alert, well developed, in no acute distress. HEENT: Conjunctivae and lids unremarkable. Cardiovascular: Regular rhythm.  Lungs: Normal work of breathing. Neurologic: No focal deficits.   Lab Results  Component Value Date   CREATININE 0.82 08/18/2020   BUN 7 08/18/2020   NA 140 08/18/2020   K 4.1 08/18/2020   CL 103 08/18/2020   CO2 23 08/18/2020   Lab Results  Component Value Date   ALT 11 08/18/2020   AST 13 08/18/2020   ALKPHOS 74 08/18/2020   BILITOT 0.5 08/18/2020   Lab Results  Component Value Date   HGBA1C 5.6 08/18/2020   HGBA1C 5.8 (H) 02/27/2020   HGBA1C 5.6 08/07/2019   HGBA1C 5.7 12/21/2018   HGBA1C 5.8 12/15/2017   Lab Results  Component Value Date   INSULIN 13.2 08/07/2019   Lab Results  Component Value Date   TSH 1.23 02/27/2020   Lab Results  Component Value Date   CHOL 164 08/18/2020   HDL 55 08/18/2020    LDLCALC 95 08/18/2020   TRIG 71 08/18/2020   CHOLHDL 3.0 08/18/2020   Lab Results  Component Value Date   WBC CANCELED 08/18/2020   HGB 11.9 02/27/2020   HCT CANCELED 08/18/2020   MCV 78.9 (L) 02/27/2020   PLT 362 02/27/2020   Lab Results  Component Value Date   IRON 33 08/18/2020   TIBC 310 08/18/2020   FERRITIN 37 08/18/2020   Attestation Statements:   Reviewed by clinician on day of visit: allergies, medications, problem list, medical history, surgical history, family history, social history, and previous encounter notes.  I, 08/20/2020, CMA, am acting as transcriptionist for Insurance claims handler, DO  I have reviewed the above documentation for accuracy and completeness, and I agree with the above. Helane Rima, DO

## 2020-10-13 ENCOUNTER — Other Ambulatory Visit: Payer: Self-pay

## 2020-10-13 ENCOUNTER — Encounter (INDEPENDENT_AMBULATORY_CARE_PROVIDER_SITE_OTHER): Payer: Self-pay | Admitting: Family Medicine

## 2020-10-13 ENCOUNTER — Ambulatory Visit (INDEPENDENT_AMBULATORY_CARE_PROVIDER_SITE_OTHER): Payer: BC Managed Care – PPO | Admitting: Family Medicine

## 2020-10-13 VITALS — BP 121/84 | HR 97 | Temp 98.2°F | Ht 65.0 in | Wt 282.0 lb

## 2020-10-13 DIAGNOSIS — Z6841 Body Mass Index (BMI) 40.0 and over, adult: Secondary | ICD-10-CM

## 2020-10-13 DIAGNOSIS — E65 Localized adiposity: Secondary | ICD-10-CM

## 2020-10-13 DIAGNOSIS — E8881 Metabolic syndrome: Secondary | ICD-10-CM

## 2020-10-13 DIAGNOSIS — F5081 Binge eating disorder: Secondary | ICD-10-CM

## 2020-10-13 DIAGNOSIS — Z9189 Other specified personal risk factors, not elsewhere classified: Secondary | ICD-10-CM

## 2020-10-14 NOTE — Progress Notes (Signed)
Chief Complaint:   OBESITY Lauren Powell is here to discuss her progress with her obesity treatment plan along with follow-up of her obesity related diagnoses.   Today's visit was #: 20 Starting weight: 276 lbs Starting date: 08/07/2019 Today's weight: 282 lbs Today's date: 10/13/2020 Total lbs lost to date: +6 lbs Body mass index is 46.93 kg/m.   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  Current Meal Plan: practicing portion control and making smarter food choices, such as increasing vegetables and decreasing simple carbohydrates for 100% of the time.  Current Exercise Plan: Cardio/strength training for 30 minutes 2 times per week. Current Anti-Obesity Medications: Rybelsus 7 mg daily. Side effects: None.  Assessment/Plan:   1. Metabolic syndrome Starting goal: Lose 7-10% of starting weight. She will continue to focus on protein-rich, low simple carbohydrate foods. We reviewed the importance of hydration, regular exercise for stress reduction, and restorative sleep.  We will continue to check lab work every 3 months, with 10% weight loss, or should any other concerns arise.  2. Visceral obesity Current visceral fat rating: 15. Visceral fat rating should be < 13. Visceral adipose tissue is a hormonally active component of total body fat. This body composition phenotype is associated with medical disorders such as metabolic syndrome, cardiovascular disease and several malignancies including prostate, breast, and colorectal cancers. Starting goal: Lose 7-10% of starting weight.   3. Binge eating disorder Lauren Powell is taking Vyvanse 30 mg daily for BED.  Plan:  Continue taking Vyvanse at current dose.   People who binge eat feel as if they don't have control over how much they eat and have feelings of guilt or self-loathing after a binge eating episode. [employer] estimates that about 30 percent of adults with binge eating disorder also have a history of ADHD. The FDA has approved Vyvanse as a treatment option for both ADHD and binge eating. Vyvanse targets the brain's reward center by increasing the levels of dopamine and norepinephrine, the chemicals of the brain responsible for feelings of pleasure. Mindful eating is the recommended nutritional approach to treating BED.   4. At risk for heart disease Due to Lauren Powell's current state of health and medical condition(s), she is at a higher risk for heart disease.  This puts the patient at much greater risk to subsequently develop cardiopulmonary conditions that can significantly affect patient's quality of life in a negative manner.    At least 8 minutes were spent on counseling Lauren Powell about these concerns today. Evidence-based interventions for health behavior change were utilized today including the discussion of self monitoring techniques, problem-solving barriers, and SMART goal setting techniques.  Specifically, regarding patient's less desirable eating habits and patterns, we employed the technique of small changes when Lauren Powell has not been able to fully commit to her prudent nutritional plan.  5. Obesity, current BMI 47.1  Course: Lauren Powell is currently in the action stage of change. As such, her goal is to continue with weight loss efforts.   Nutrition goals: She has agreed to practicing portion control and making smarter food choices, such as increasing vegetables and decreasing simple carbohydrates.   Exercise goals: For substantial health benefits, adults should do at least 150 minutes (2 hours and 30 minutes) a week of moderate-intensity, or 75 minutes (1 hour and 15 minutes) a week of vigorous-intensity aerobic physical activity, or an  equivalent combination of moderate- and vigorous-intensity aerobic activity. Aerobic activity should be performed in episodes of at least 10 minutes, and preferably,  it should be spread throughout the week.  Behavioral modification strategies: increasing lean protein intake, decreasing simple carbohydrates, increasing vegetables and increasing water intake.  Lauren Powell has agreed to follow-up with our clinic in 2-3 weeks. She was informed of the importance of frequent follow-up visits to maximize her success with intensive lifestyle modifications for her multiple health conditions.   Objective:   Blood pressure 121/84, pulse 97, temperature 98.2 F (36.8 C), temperature source Oral, height 5\' 5"  (1.651 m), weight 282 lb (127.9 kg), SpO2 96 %. Body mass index is 46.93 kg/m.  General: Cooperative, alert, well developed, in no acute distress. HEENT: Conjunctivae and lids unremarkable. Cardiovascular: Regular rhythm.  Lungs: Normal work of breathing. Neurologic: No focal deficits.   Lab Results  Component Value Date   CREATININE 0.82 08/18/2020   BUN 7 08/18/2020   NA 140 08/18/2020   K 4.1 08/18/2020   CL 103 08/18/2020   CO2 23 08/18/2020   Lab Results  Component Value Date   ALT 11 08/18/2020   AST 13 08/18/2020   ALKPHOS 74 08/18/2020   BILITOT 0.5 08/18/2020   Lab Results  Component Value Date   HGBA1C 5.6 08/18/2020   HGBA1C 5.8 (H) 02/27/2020   HGBA1C 5.6 08/07/2019   HGBA1C 5.7 12/21/2018   HGBA1C 5.8 12/15/2017   Lab Results  Component Value Date   INSULIN 13.2 08/07/2019   Lab Results  Component Value Date   TSH 1.23 02/27/2020   Lab Results  Component Value Date   CHOL 164 08/18/2020   HDL 55 08/18/2020   LDLCALC 95 08/18/2020   TRIG 71 08/18/2020   CHOLHDL 3.0 08/18/2020   Lab Results  Component Value Date   WBC CANCELED 08/18/2020   HGB 11.9 02/27/2020   HCT CANCELED 08/18/2020   MCV 78.9 (L) 02/27/2020   PLT 362 02/27/2020   Lab Results  Component Value Date   IRON 33 08/18/2020   TIBC 310 08/18/2020   FERRITIN 37 08/18/2020   Attestation Statements:   Reviewed by clinician on day of visit:  allergies, medications, problem list, medical history, surgical history, family history, social history, and previous encounter notes.  I, 08/20/2020, CMA, am acting as transcriptionist for Insurance claims handler, DO  I have reviewed the above documentation for accuracy and completeness, and I agree with the above. Helane Rima, DO

## 2020-11-04 ENCOUNTER — Encounter (INDEPENDENT_AMBULATORY_CARE_PROVIDER_SITE_OTHER): Payer: Self-pay | Admitting: Family Medicine

## 2020-11-04 ENCOUNTER — Other Ambulatory Visit: Payer: Self-pay

## 2020-11-04 ENCOUNTER — Ambulatory Visit (INDEPENDENT_AMBULATORY_CARE_PROVIDER_SITE_OTHER): Payer: BC Managed Care – PPO | Admitting: Family Medicine

## 2020-11-04 VITALS — BP 113/80 | HR 96 | Temp 98.5°F | Ht 65.0 in | Wt 280.0 lb

## 2020-11-04 DIAGNOSIS — E559 Vitamin D deficiency, unspecified: Secondary | ICD-10-CM

## 2020-11-04 DIAGNOSIS — E8881 Metabolic syndrome: Secondary | ICD-10-CM | POA: Diagnosis not present

## 2020-11-04 DIAGNOSIS — Z6841 Body Mass Index (BMI) 40.0 and over, adult: Secondary | ICD-10-CM

## 2020-11-04 DIAGNOSIS — Z9189 Other specified personal risk factors, not elsewhere classified: Secondary | ICD-10-CM | POA: Diagnosis not present

## 2020-11-04 DIAGNOSIS — R7303 Prediabetes: Secondary | ICD-10-CM | POA: Diagnosis not present

## 2020-11-05 MED ORDER — VITAMIN D (ERGOCALCIFEROL) 1.25 MG (50000 UNIT) PO CAPS: 50000.0000 [IU] | ORAL_CAPSULE | ORAL | 0 refills | Status: DC

## 2020-11-05 MED ORDER — MONTELUKAST SODIUM 10 MG PO TABS: ORAL_TABLET | ORAL | 1 refills | Status: DC

## 2020-11-05 MED ORDER — METFORMIN HCL 500 MG PO TABS: 500.0000 mg | ORAL_TABLET | Freq: Every day | ORAL | 0 refills | Status: DC

## 2020-11-05 MED ORDER — RYBELSUS 14 MG PO TABS: 14.0000 mg | ORAL_TABLET | Freq: Every day | ORAL | 1 refills | Status: DC

## 2020-11-05 NOTE — Progress Notes (Signed)
Chief Complaint:   OBESITY Lauren Powell is here to discuss her progress with her obesity treatment plan along with follow-up of her obesity related diagnoses. See Medical Weight Management Flowsheet for bioelectrical impedance results.  Today's visit was #: 21 Starting weight: 276 lbs Starting date: 08/07/2019 Today's weight: 280 lbs Today's date: 11/04/2020 Weight change since last visit: 2 lbs Total lbs lost to date: +4 lbs Body mass index is 46.59 kg/m.   Interim History: Lauren Powell says that school is out in 3 days.  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  She says she is ready to focus on self-care.  She has been drinking more water.  Nutrition Plan: practicing portion control and making smarter food choices, such as increasing vegetables and decreasing simple carbohydrates for 80% of the time. Activity: Walking/cardio for 30 minutes 3 times per week. Anti-obesity medications: Rybelsus 7 mg daily. Reported side effects: None.  Assessment/Plan:   1. Metabolic syndrome Starting goal: Lose 7-10% of starting weight. She will continue to focus on protein-rich, low simple carbohydrate foods. We reviewed the importance of hydration, regular exercise for stress reduction, and restorative sleep.  We will continue to check lab work every 3 months, with 10% weight loss, or should any other concerns arise. Plan:  Will increase Rybelsus to 14 mg daily, as per below.  - Increase Semaglutide (RYBELSUS) 14 MG TABS; Take 14 mg by mouth daily.  Dispense: 30 tablet; Refill: 1  2. Prediabetes At goal. Goal is HgbA1c < 5.7.  Medication: metformin 500 mg dialy, Rybelsus 7 mg daily.    Plan:  Continue metformin and increase Rybelsus to 14 mg daily.  She will continue to focus on protein-rich, low simple carbohydrate foods. We reviewed the importance of hydration, regular exercise for stress reduction, and restorative sleep.   Lab Results  Component Value Date    HGBA1C 5.6 08/18/2020   Lab Results  Component Value Date   INSULIN 13.2 08/07/2019   - Refill metFORMIN (GLUCOPHAGE) 500 MG tablet; Take 1 tablet (500 mg total) by mouth daily.  Dispense: 90 tablet; Refill: 0  3. Vitamin D deficiency Not at goal. Current vitamin D is 20.3, tested on 08/18/2020. Optimal goal > 50 ng/dL. She is taking vitamin D 50,000 IU twice weekly.  Plan: Continue to take prescription Vitamin D @50 ,000 IU twice weekly as prescribed.  Follow-up for routine testing of Vitamin D, at least 2-3 times per year to avoid over-replacement.  - Refill Vitamin D, Ergocalciferol, (DRISDOL) 1.25 MG (50000 UNIT) CAPS capsule; Take 1 capsule (50,000 Units total) by mouth 2 (two) times a week.  Dispense: 8 capsule; Refill: 0  4. At risk for heart disease Due to Lauren Powell's current state of health and medical condition(s), she is at a higher risk for heart disease.  This puts the patient at much greater risk to subsequently develop cardiopulmonary conditions that can significantly affect patient's quality of life in a negative manner.    At least 8 minutes were spent on counseling Lauren Powell about these concerns today. Evidence-based interventions for health behavior change were utilized today including the discussion of self monitoring techniques, problem-solving barriers, and SMART goal setting techniques.  Specifically, regarding patient's less desirable eating habits and patterns, we employed the technique of small changes when Lauren Powell has not been able to fully commit to her prudent nutritional plan.  5. Obesity, current BMI 46.7  Course: Lauren Powell is currently in the action stage of change.  As such, her goal is to continue with weight loss efforts.   Nutrition goals: She has agreed to practicing portion control and making smarter food choices, such as increasing vegetables and decreasing simple carbohydrates.   Exercise goals: For substantial health benefits, adults should do at least 150 minutes (2  hours and 30 minutes) a week of moderate-intensity, or 75 minutes (1 hour and 15 minutes) a week of vigorous-intensity aerobic physical activity, or an equivalent combination of moderate- and vigorous-intensity aerobic activity. Aerobic activity should be performed in episodes of at least 10 minutes, and preferably, it should be spread throughout the week.  Behavioral modification strategies: increasing lean protein intake, decreasing simple carbohydrates, increasing vegetables and increasing water intake.  Lauren Powell has agreed to follow-up with our clinic in 3-4 weeks. She was informed of the importance of frequent follow-up visits to maximize her success with intensive lifestyle modifications for her multiple health conditions.   Objective:   Blood pressure 113/80, pulse 96, temperature 98.5 F (36.9 C), temperature source Oral, height 5\' 5"  (1.651 m), weight 280 lb (127 kg), SpO2 98 %. Body mass index is 46.59 kg/m.  General: Cooperative, alert, well developed, in no acute distress. HEENT: Conjunctivae and lids unremarkable. Cardiovascular: Regular rhythm.  Lungs: Normal work of breathing. Neurologic: No focal deficits.   Lab Results  Component Value Date   CREATININE 0.82 08/18/2020   BUN 7 08/18/2020   NA 140 08/18/2020   K 4.1 08/18/2020   CL 103 08/18/2020   CO2 23 08/18/2020   Lab Results  Component Value Date   ALT 11 08/18/2020   AST 13 08/18/2020   ALKPHOS 74 08/18/2020   BILITOT 0.5 08/18/2020   Lab Results  Component Value Date   HGBA1C 5.6 08/18/2020   HGBA1C 5.8 (H) 02/27/2020   HGBA1C 5.6 08/07/2019   HGBA1C 5.7 12/21/2018   HGBA1C 5.8 12/15/2017   Lab Results  Component Value Date   INSULIN 13.2 08/07/2019   Lab Results  Component Value Date   TSH 1.23 02/27/2020   Lab Results  Component Value Date   CHOL 164 08/18/2020   HDL 55 08/18/2020   LDLCALC 95 08/18/2020   TRIG 71 08/18/2020   CHOLHDL 3.0 08/18/2020   Lab Results  Component Value Date    WBC CANCELED 08/18/2020   HGB 11.9 02/27/2020   HCT CANCELED 08/18/2020   MCV 78.9 (L) 02/27/2020   PLT 362 02/27/2020   Lab Results  Component Value Date   IRON 33 08/18/2020   TIBC 310 08/18/2020   FERRITIN 37 08/18/2020   Attestation Statements:   Reviewed by clinician on day of visit: allergies, medications, problem list, medical history, surgical history, family history, social history, and previous encounter notes.  I, 08/20/2020, CMA, am acting as transcriptionist for Insurance claims handler, DO  I have reviewed the above documentation for accuracy and completeness, and I agree with the above. Helane Rima, DO

## 2020-11-18 ENCOUNTER — Ambulatory Visit (INDEPENDENT_AMBULATORY_CARE_PROVIDER_SITE_OTHER): Payer: BC Managed Care – PPO | Admitting: Family Medicine

## 2020-12-09 ENCOUNTER — Other Ambulatory Visit: Payer: Self-pay

## 2020-12-09 ENCOUNTER — Encounter (INDEPENDENT_AMBULATORY_CARE_PROVIDER_SITE_OTHER): Payer: Self-pay | Admitting: Family Medicine

## 2020-12-09 ENCOUNTER — Ambulatory Visit (INDEPENDENT_AMBULATORY_CARE_PROVIDER_SITE_OTHER): Payer: BC Managed Care – PPO | Admitting: Family Medicine

## 2020-12-09 VITALS — BP 117/80 | HR 75 | Temp 98.0°F | Ht 65.0 in | Wt 279.0 lb

## 2020-12-09 DIAGNOSIS — E8881 Metabolic syndrome: Secondary | ICD-10-CM

## 2020-12-09 DIAGNOSIS — F5081 Binge eating disorder: Secondary | ICD-10-CM | POA: Diagnosis not present

## 2020-12-09 DIAGNOSIS — R7303 Prediabetes: Secondary | ICD-10-CM

## 2020-12-09 DIAGNOSIS — E559 Vitamin D deficiency, unspecified: Secondary | ICD-10-CM

## 2020-12-09 DIAGNOSIS — Z9189 Other specified personal risk factors, not elsewhere classified: Secondary | ICD-10-CM | POA: Diagnosis not present

## 2020-12-09 DIAGNOSIS — Z6841 Body Mass Index (BMI) 40.0 and over, adult: Secondary | ICD-10-CM

## 2020-12-09 MED ORDER — LISDEXAMFETAMINE DIMESYLATE 30 MG PO CAPS: 30.0000 mg | ORAL_CAPSULE | Freq: Every day | ORAL | 0 refills | Status: DC

## 2020-12-09 MED ORDER — VITAMIN D (ERGOCALCIFEROL) 1.25 MG (50000 UNIT) PO CAPS: 50000.0000 [IU] | ORAL_CAPSULE | ORAL | 0 refills | Status: DC

## 2020-12-09 MED ORDER — RYBELSUS 14 MG PO TABS: 14.0000 mg | ORAL_TABLET | Freq: Every day | ORAL | 1 refills | Status: DC

## 2020-12-09 MED ORDER — METFORMIN HCL 500 MG PO TABS: 500.0000 mg | ORAL_TABLET | Freq: Every day | ORAL | 0 refills | Status: DC

## 2020-12-10 LAB — HEMOGLOBIN A1C
Est. average glucose Bld gHb Est-mCnc: 120 mg/dL
Hgb A1c MFr Bld: 5.8 % — ABNORMAL HIGH (ref 4.8–5.6)

## 2020-12-10 LAB — VITAMIN D 25 HYDROXY (VIT D DEFICIENCY, FRACTURES): Vit D, 25-Hydroxy: 27.4 ng/mL — ABNORMAL LOW (ref 30.0–100.0)

## 2020-12-17 NOTE — Progress Notes (Signed)
Chief Complaint:   OBESITY Lauren Powell is here to discuss her progress with her obesity treatment plan along with follow-up of her obesity related diagnoses.   Today's visit was #: 22 Starting weight: 276 lbs Starting date: 08/07/2019 Today's weight: 279 lbs Today's date: 12/09/2020 Weight change since last visit: 1 lb Total lbs lost to date: +3 lbs Body mass index is 46.43 kg/m.   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  She has been swimming a lot.  Current Meal Plan: practicing portion control and making smarter food choices, such as increasing vegetables and decreasing simple carbohydrates for 70% of the time.  Current Exercise Plan: Swimming for 30+ minutes 5 times per week. Current Anti-Obesity Medications: Rybelsus 14 mg daily. Side effects: None.  Assessment/Plan:   Orders Placed This Encounter  Procedures   VITAMIN D 25 Hydroxy (Vit-D Deficiency, Fractures)   Hemoglobin A1c   Meds ordered this encounter  Medications   Vitamin D, Ergocalciferol, (DRISDOL) 1.25 MG (50000 UNIT) CAPS capsule    Sig: Take 1 capsule (50,000 Units total) by mouth 2 (two) times a week.    Dispense:  8 capsule    Refill:  0   Semaglutide (RYBELSUS) 14 MG TABS    Sig: Take 14 mg by mouth daily.    Dispense:  30 tablet    Refill:  1   metFORMIN (GLUCOPHAGE) 500 MG tablet    Sig: Take 1 tablet (500 mg total) by mouth daily.    Dispense:  90 tablet    Refill:  0   lisdexamfetamine (VYVANSE) 30 MG capsule    Sig: Take 1 capsule (30 mg total) by mouth daily.    Dispense:  30 capsule    Refill:  0    1. Vitamin D deficiency Not at goal.  She is taking vitamin D 50,000 IU weekly.  Plan: Continue to take prescription Vitamin D @50 ,000 IU every week as prescribed.  Will check vitamin D level today.  Lab Results  Component Value Date   VD25OH 27.4 (L) 12/09/2020    VD25OH 20.3 (L) 08/18/2020   VD25OH 26 (L) 02/27/2020   - Refill Vitamin D, Ergocalciferol, (DRISDOL) 1.25 MG (50000 UNIT) CAPS capsule; Take 1 capsule (50,000 Units total) by mouth 2 (two) times a week.  Dispense: 8 capsule; Refill: 0 - VITAMIN D 25 Hydroxy (Vit-D Deficiency, Fractures)  2. Metabolic syndrome Starting goal: Lose 7-10% of starting weight. She will continue to focus on protein-rich, low simple carbohydrate foods. We reviewed the importance of hydration, regular exercise for stress reduction, and restorative sleep.  We will continue to check lab work every 3 months, with 10% weight loss, or should any other concerns arise.  - Refill Semaglutide (RYBELSUS) 14 MG TABS; Take 14 mg by mouth daily.  Dispense: 30 tablet; Refill: 1  3. Prediabetes Not at goal. Goal is HgbA1c < 5.7.  Medication: metformin 500 mg daily and Rybelsus 14 mg daily.    Plan:  Continue medications.  She will continue to focus on protein-rich, low simple carbohydrate foods. We reviewed the importance of hydration, regular exercise for stress reduction, and restorative sleep.  Will check A1c today.  Lab Results  Component Value Date   HGBA1C 5.8 (H) 12/09/2020   Lab Results  Component Value Date   INSULIN 13.2 08/07/2019   - Refill metFORMIN (GLUCOPHAGE) 500  MG tablet; Take 1 tablet (500 mg total) by mouth daily.  Dispense: 90 tablet; Refill: 0 - Hemoglobin A1c  4. Binge eating disorder Lauren Powell is taking Vyvanse 30 mg daily for BED.  Will refill today.  People who binge eat feel as if they don't have control over how much they eat and have feelings of guilt or self-loathing after a binge eating episode. [employer] estimates that about 30 percent of adults with binge eating disorder also have a history of ADHD. The FDA has approved Vyvanse as a treatment option for both ADHD and binge eating. Vyvanse targets the brain's reward center by increasing the levels of dopamine and norepinephrine, the  chemicals of the brain responsible for feelings of pleasure. Mindful eating is the recommended nutritional approach to treating BED.   I have consulted the [place] Controlled Substances Registry for this patient, and feel the risk/benefit ratio today is favorable for proceeding with this prescription for a controlled substance. The patient understands monitoring parameters and red flags.   - Refill lisdexamfetamine (VYVANSE) 30 MG capsule; Take 1 capsule (30 mg total) by mouth daily.  Dispense: 30 capsule; Refill: 0  5. At risk for diabetes mellitus Breeze was given diabetes prevention education and counseling today of more than 8 minutes. During insulin resistance, several metabolic alterations induce the development of cardiovascular disease. For instance, insulin resistance can induce an imbalance in glucose metabolism that generates chronic hyperglycemia, which in turn triggers oxidative stress and causes an inflammatory response that leads to cell damage. Insulin resistance can also alter systemic lipid metabolism which then leads to the development of dyslipidemia and the well-known lipid triad: (1) high levels of plasma triglycerides, (2) low levels of high-density lipoprotein, and (3) the appearance of small dense low-density lipoproteins. This triad, along with endothelial dysfunction, which can also be induced by aberrant insulin signaling, contribute to atherosclerotic plaque formation.   6. Obesity, current BMI 46.6  Course: Lauren Powell is currently in the action stage of change. As such, her goal is to continue with weight loss efforts.   Nutrition goals: She has agreed to practicing portion control and making smarter food choices, such as increasing vegetables and decreasing simple carbohydrates.   Exercise goals: For substantial health benefits, adults should do at least 150 minutes (2 hours and 30 minutes) a week of moderate-intensity, or 75 minutes (1 hour and 15 minutes) a week of  vigorous-intensity aerobic physical activity, or an equivalent combination of moderate- and vigorous-intensity aerobic activity. Aerobic activity should be performed in episodes of at least 10 minutes, and preferably, it should be spread throughout the week.  Behavioral modification strategies: increasing lean protein intake, decreasing simple carbohydrates, increasing vegetables, and increasing water intake.  Netha has agreed to follow-up with our clinic in 4 weeks. She was informed of the importance of frequent follow-up visits to maximize her success with intensive lifestyle modifications for her multiple health conditions.   Objective:   Blood pressure 117/80, pulse 75, temperature 98 F (36.7 C), temperature source Oral, height 5\' 5"  (1.651 m), weight 279 lb (126.6 kg), SpO2 98 %. Body mass index is 46.43 kg/m.  General: Cooperative, alert, well developed, in no acute distress. HEENT: Conjunctivae and lids unremarkable. Cardiovascular: Regular rhythm.  Lungs: Normal work of breathing. Neurologic: No focal deficits.   Lab Results  Component Value Date   CREATININE 0.82 08/18/2020   BUN 7 08/18/2020   NA 140 08/18/2020   K 4.1 08/18/2020   CL 103 08/18/2020  CO2 23 08/18/2020   Lab Results  Component Value Date   ALT 11 08/18/2020   AST 13 08/18/2020   ALKPHOS 74 08/18/2020   BILITOT 0.5 08/18/2020   Lab Results  Component Value Date   HGBA1C 5.8 (H) 12/09/2020   HGBA1C 5.6 08/18/2020   HGBA1C 5.8 (H) 02/27/2020   HGBA1C 5.6 08/07/2019   HGBA1C 5.7 12/21/2018   Lab Results  Component Value Date   INSULIN 13.2 08/07/2019   Lab Results  Component Value Date   TSH 1.23 02/27/2020   Lab Results  Component Value Date   CHOL 164 08/18/2020   HDL 55 08/18/2020   LDLCALC 95 08/18/2020   TRIG 71 08/18/2020   CHOLHDL 3.0 08/18/2020   Lab Results  Component Value Date   VD25OH 27.4 (L) 12/09/2020   VD25OH 20.3 (L) 08/18/2020   VD25OH 26 (L) 02/27/2020   Lab  Results  Component Value Date   WBC CANCELED 08/18/2020   HGB 11.9 02/27/2020   HCT CANCELED 08/18/2020   MCV 78.9 (L) 02/27/2020   PLT 362 02/27/2020   Lab Results  Component Value Date   IRON 33 08/18/2020   TIBC 310 08/18/2020   FERRITIN 37 08/18/2020   Attestation Statements:   Reviewed by clinician on day of visit: allergies, medications, problem list, medical history, surgical history, family history, social history, and previous encounter notes.  I, Insurance claims handler, CMA, am acting as transcriptionist for Helane Rima, DO  I have reviewed the above documentation for accuracy and completeness, and I agree with the above. Helane Rima, DO

## 2020-12-29 ENCOUNTER — Encounter (INDEPENDENT_AMBULATORY_CARE_PROVIDER_SITE_OTHER): Payer: Self-pay | Admitting: Family Medicine

## 2020-12-30 ENCOUNTER — Ambulatory Visit (INDEPENDENT_AMBULATORY_CARE_PROVIDER_SITE_OTHER): Payer: BC Managed Care – PPO | Admitting: Family Medicine

## 2021-01-01 ENCOUNTER — Encounter (INDEPENDENT_AMBULATORY_CARE_PROVIDER_SITE_OTHER): Payer: Self-pay | Admitting: Adult Health

## 2021-01-01 ENCOUNTER — Ambulatory Visit (INDEPENDENT_AMBULATORY_CARE_PROVIDER_SITE_OTHER): Payer: BC Managed Care – PPO | Admitting: Adult Health

## 2021-01-01 ENCOUNTER — Other Ambulatory Visit: Payer: Self-pay

## 2021-01-01 VITALS — BP 105/75 | HR 81 | Temp 98.0°F | Ht 65.0 in | Wt 279.0 lb

## 2021-01-01 DIAGNOSIS — Z9189 Other specified personal risk factors, not elsewhere classified: Secondary | ICD-10-CM | POA: Diagnosis not present

## 2021-01-01 DIAGNOSIS — R7303 Prediabetes: Secondary | ICD-10-CM | POA: Diagnosis not present

## 2021-01-01 DIAGNOSIS — E8881 Metabolic syndrome: Secondary | ICD-10-CM | POA: Diagnosis not present

## 2021-01-01 DIAGNOSIS — F321 Major depressive disorder, single episode, moderate: Secondary | ICD-10-CM | POA: Diagnosis not present

## 2021-01-01 DIAGNOSIS — E559 Vitamin D deficiency, unspecified: Secondary | ICD-10-CM

## 2021-01-01 DIAGNOSIS — Z6841 Body Mass Index (BMI) 40.0 and over, adult: Secondary | ICD-10-CM

## 2021-01-01 MED ORDER — RYBELSUS 14 MG PO TABS: 14.0000 mg | ORAL_TABLET | Freq: Every day | ORAL | 1 refills | Status: DC

## 2021-01-06 DIAGNOSIS — F321 Major depressive disorder, single episode, moderate: Secondary | ICD-10-CM | POA: Insufficient documentation

## 2021-01-06 DIAGNOSIS — E8881 Metabolic syndrome: Secondary | ICD-10-CM | POA: Insufficient documentation

## 2021-01-06 NOTE — Progress Notes (Signed)
Chief Complaint:   OBESITY Lauren Powell is here to discuss her progress with her obesity treatment plan along with follow-up of her obesity related diagnoses. Lauren Powell is on practicing portion control and making smarter food choices, such as increasing vegetables and decreasing simple carbohydrates and states she is following her eating plan approximately 100% of the time. Lauren Powell states she is doing cardio for 30 minutes 3-4 times per week.  Today's visit was #: 23 Starting weight: 276 lbs Starting date: 08/07/2019 Today's weight: 285 lbs Today's date: 01/01/2021 Total lbs lost to date: 0 Total lbs lost since last in-office visit: 0  Interim History: Lauren Powell recently traveled to [facility] for 7 days.  She feels that Lauren Powell is the best meal plan for her.  Subjective:   1. Metabolic syndrome Started on Rybelsus on 07/29/2020.  Titrated up to 14 mg daily.   She denies mass in neck, dysphagia, dyspepsia, and persistent hoarness.  2. Vitamin D deficiency On 12/09/2020, vitamin D level was 27.4 - well below goal of 50 (but improving).   She is currently taking prescription vitamin D 50,000 IU twice weekly. She denies nausea, vomiting or muscle weakness.  Lab Results  Component Value Date   VD25OH 27.4 (L) 12/09/2020   VD25OH 20.3 (L) 08/18/2020   VD25OH 26 (L) 02/27/2020   3. Prediabetes On 12/09/2020, A1c was 5.8, which is up from 5.6 on 08/18/2020.  She is on metformin 500 mg daily.  Lab Results  Component Value Date   HGBA1C 5.8 (H) 12/09/2020   Lab Results  Component Value Date   INSULIN 13.2 08/07/2019   4. Depression, major, single episode, moderate (HCC) She reports stable mood.  Denies SI/HI.  She is on bupropion XL 150 mg daily - managed by PCP.  5. At risk for nausea Lauren Powell is at risk for nausea due to taking Rybelsus for metabolic syndrome.  Assessment/Plan:   1. Metabolic syndrome Refill Rybelsus 14 mg daiy, as per below.  - Refill Semaglutide (RYBELSUS) 14 MG TABS; Take  14 mg by mouth daily.  Dispense: 30 tablet; Refill: 1  2. Vitamin D deficiency Low Vitamin D level contributes to fatigue and are associated with obesity, breast, and colon cancer. She agrees to continue to take prescription Vitamin D @50 ,000 IU twice weekly and will follow-up for routine testing of Vitamin D, at least 2-3 times per year to avoid over-replacement.  3. Prediabetes Lauren Powell will continue to work on weight loss, exercise, and decreasing simple carbohydrates to help decrease the risk of diabetes.  Continue metformin.  No refill needed.  4. Depression, major, single episode, moderate (HCC) Behavior modification techniques were discussed today to help Lauren Powell deal with her emotional/non-hunger eating behaviors.  Orders and follow up as documented in patient record.   5. At risk for nausea Lauren Powell Lauren Powell was given approximately 15 minutes of nausea prevention counseling today. Lauren Powell is at risk for nausea due to her new or current medication. She was encouraged to titrate her medication slowly, make sure to stay hydrated, eat smaller portions throughout the day, and avoid high fat meals.    6. Obesity, current BMI 47.5  Lauren Powell is currently in the action stage of change. As such, her goal is to continue with weight loss efforts. She has agreed to practicing portion control and making smarter food choices, such as increasing vegetables and decreasing simple carbohydrates.   Handout:  [employer].  Recommend tracking intake with Lauren Powell.  Will check fasting  labs at next office visit.  Exercise goals:  As is.  Behavioral modification strategies: increasing lean protein intake, decreasing simple carbohydrates, meal planning and cooking strategies, keeping healthy foods in the home, and planning for success.  Lauren Powell has agreed to follow-up with our clinic in 3 weeks. She was informed of the importance of frequent follow-up visits to maximize her success with intensive lifestyle  modifications for her multiple health conditions.   Objective:   Blood pressure 105/75, pulse 81, temperature 98 F (36.7 C), height 5\' 5"  (1.651 m), weight 279 lb (126.6 kg), SpO2 95 %. Body mass index is 46.43 kg/m.  General: Cooperative, alert, well developed, in no acute distress. HEENT: Conjunctivae and lids unremarkable. Cardiovascular: Regular rhythm.  Lungs: Normal work of breathing. Neurologic: No focal deficits.   Lab Results  Component Value Date   CREATININE 0.82 08/18/2020   BUN 7 08/18/2020   NA 140 08/18/2020   K 4.1 08/18/2020   CL 103 08/18/2020   CO2 23 08/18/2020   Lab Results  Component Value Date   ALT 11 08/18/2020   AST 13 08/18/2020   ALKPHOS 74 08/18/2020   BILITOT 0.5 08/18/2020   Lab Results  Component Value Date   HGBA1C 5.8 (H) 12/09/2020   HGBA1C 5.6 08/18/2020   HGBA1C 5.8 (H) 02/27/2020   HGBA1C 5.6 08/07/2019   HGBA1C 5.7 12/21/2018   Lab Results  Component Value Date   INSULIN 13.2 08/07/2019   Lab Results  Component Value Date   TSH 1.23 02/27/2020   Lab Results  Component Value Date   CHOL 164 08/18/2020   HDL 55 08/18/2020   LDLCALC 95 08/18/2020   TRIG 71 08/18/2020   CHOLHDL 3.0 08/18/2020   Lab Results  Component Value Date   VD25OH 27.4 (L) 12/09/2020   VD25OH 20.3 (L) 08/18/2020   VD25OH 26 (L) 02/27/2020   Lab Results  Component Value Date   WBC CANCELED 08/18/2020   HGB 11.9 02/27/2020   HCT CANCELED 08/18/2020   MCV 78.9 (L) 02/27/2020   PLT 362 02/27/2020   Lab Results  Component Value Date   IRON 33 08/18/2020   TIBC 310 08/18/2020   FERRITIN 37 08/18/2020   Attestation Statements:   Reviewed by clinician on day of visit: allergies, medications, problem list, medical history, surgical history, family history, social history, and previous encounter notes.  I, 08/20/2020, CMA, am acting as Insurance claims handler for Energy manager, NP.  I have reviewed the above documentation for accuracy and  completeness, and I agree with the above. -  Penina Reisner d. Clanton Emanuelson, NP-C

## 2021-01-20 ENCOUNTER — Other Ambulatory Visit: Payer: Self-pay

## 2021-01-20 ENCOUNTER — Ambulatory Visit (INDEPENDENT_AMBULATORY_CARE_PROVIDER_SITE_OTHER): Payer: BC Managed Care – PPO | Admitting: Family Medicine

## 2021-01-20 ENCOUNTER — Encounter (INDEPENDENT_AMBULATORY_CARE_PROVIDER_SITE_OTHER): Payer: Self-pay | Admitting: Family Medicine

## 2021-01-20 VITALS — BP 121/83 | HR 79 | Temp 98.3°F | Ht 65.0 in | Wt 283.0 lb

## 2021-01-20 DIAGNOSIS — F5081 Binge eating disorder: Secondary | ICD-10-CM | POA: Diagnosis not present

## 2021-01-20 DIAGNOSIS — R7303 Prediabetes: Secondary | ICD-10-CM | POA: Diagnosis not present

## 2021-01-20 DIAGNOSIS — E8881 Metabolic syndrome: Secondary | ICD-10-CM

## 2021-01-20 DIAGNOSIS — Z9189 Other specified personal risk factors, not elsewhere classified: Secondary | ICD-10-CM | POA: Diagnosis not present

## 2021-01-20 DIAGNOSIS — Z6841 Body Mass Index (BMI) 40.0 and over, adult: Secondary | ICD-10-CM

## 2021-01-21 ENCOUNTER — Other Ambulatory Visit: Payer: Self-pay | Admitting: Adult Health

## 2021-01-21 DIAGNOSIS — F321 Major depressive disorder, single episode, moderate: Secondary | ICD-10-CM

## 2021-01-21 NOTE — Progress Notes (Signed)
Chief Complaint:   OBESITY Lauren Powell is here to discuss her progress with her obesity treatment plan along with follow-up of her obesity related diagnoses.   Today's visit was #: 24 Starting weight: 276 lbs Starting date: 08/07/2019 Today's weight: 283 lbs Today's date: 01/20/2020 Weight change since last visit: +4 lbs Total lbs lost to date: +13 lbs Body mass index is 47.09 kg/m.   Current Meal Plan: practicing portion control and making smarter food choices, such as increasing vegetables and decreasing simple carbohydrates for 60% of the time.  Current Exercise Plan: None at this time. Current Anti-Obesity Medications: Rybelsus 14 mg daily. Side effects: None.  Interim History: [name] says school starts soon and is already setting healthy boundaries. She is happy about the year. She would like to wait on starting GLP-1RA injection but will be okay at next visit if she gains weight.  Assessment/Plan:   1. Metabolic syndrome Starting goal: Lose 7-10% of starting weight. She will continue to focus on protein-rich, low simple carbohydrate foods. We reviewed the importance of hydration, regular exercise for stress reduction, and restorative sleep.  We will continue to check lab work every 3 months, with 10% weight loss, or should any other concerns arise.  2. Prediabetes Not at goal. Goal is HgbA1c < 5.7.  Medication: metformin 500 mg daily and Rybelsus 14 mg daily.    Plan:  She will continue to focus on protein-rich, low simple carbohydrate foods. We reviewed the importance of hydration, regular exercise for stress reduction, and restorative sleep.   Lab Results  Component Value Date   HGBA1C 5.8 (H) 12/09/2020   Lab Results  Component Value Date   INSULIN 13.2 08/07/2019   3. Binge eating disorder Improving. Lauren Powell is taking Vyvanse 30 mg daily for BED.    The current medical regimen is effective;  continue present plan and medications.  4. At risk for heart disease Due  to Lauren Powell's current state of health and medical condition(s), she is at a higher risk for heart disease.   This puts the patient at much greater risk to subsequently develop cardiopulmonary conditions that can significantly affect patient's quality of life in a negative manner as well.    At least 9 minutes was spent on counseling Lauren Powell about these concerns today. Initial goal is to lose at least 5-10% of starting weight to help reduce these risk factors.  We will continue to reassess these conditions on a fairly regular basis in an attempt to decrease patient's overall morbidity and mortality.  Evidence-based interventions for health behavior change were utilized today including the discussion of self monitoring techniques, problem-solving barriers and SMART goal setting techniques.  Specifically regarding patient's less desirable eating habits and patterns, we employed the technique of small changes when Lauren Powell has not been able to fully commit to her prudent nutritional plan.  5. Obesity, current BMI 47.2 Course: [name] is currently in the action stage of change. As such, her goal is to continue with weight loss efforts.   Nutrition goals: She has agreed to practicing portion control and making smarter food choices, such as increasing vegetables and decreasing simple carbohydrates.   Exercise goals: For substantial health benefits, adults should do at least 150 minutes (2 hours and 30 minutes) a week of moderate-intensity, or 75 minutes (1 hour and 15 minutes) a week of vigorous-intensity aerobic physical activity, or an equivalent combination of moderate- and vigorous-intensity aerobic activity. Aerobic activity should be performed in episodes of at least  10 minutes, and preferably, it should be spread throughout the week.  Behavioral modification strategies: increasing lean protein intake, decreasing simple carbohydrates, increasing vegetables, increasing water intake, and emotional eating  strategies.  Lauren Powell has agreed to follow-up with our clinic in 4 weeks. She was informed of the importance of frequent follow-up visits to maximize her success with intensive lifestyle modifications for her multiple health conditions.   Objective:   Blood pressure 121/83, pulse 79, temperature 98.3 F (36.8 C), height 5\' 5"  (1.651 m), weight 283 lb (128.4 kg), SpO2 97 %. Body mass index is 47.09 kg/m.  General: Cooperative, alert, well developed, in no acute distress. HEENT: Conjunctivae and lids unremarkable. Cardiovascular: Regular rhythm.  Lungs: Normal work of breathing. Neurologic: No focal deficits.   Lab Results  Component Value Date   CREATININE 0.82 08/18/2020   BUN 7 08/18/2020   NA 140 08/18/2020   K 4.1 08/18/2020   CL 103 08/18/2020   CO2 23 08/18/2020   Lab Results  Component Value Date   ALT 11 08/18/2020   AST 13 08/18/2020   ALKPHOS 74 08/18/2020   BILITOT 0.5 08/18/2020   Lab Results  Component Value Date   HGBA1C 5.8 (H) 12/09/2020   HGBA1C 5.6 08/18/2020   HGBA1C 5.8 (H) 02/27/2020   HGBA1C 5.6 08/07/2019   HGBA1C 5.7 12/21/2018   Lab Results  Component Value Date   INSULIN 13.2 08/07/2019   Lab Results  Component Value Date   TSH 1.23 02/27/2020   Lab Results  Component Value Date   CHOL 164 08/18/2020   HDL 55 08/18/2020   LDLCALC 95 08/18/2020   TRIG 71 08/18/2020   CHOLHDL 3.0 08/18/2020   Lab Results  Component Value Date   VD25OH 27.4 (L) 12/09/2020   VD25OH 20.3 (L) 08/18/2020   VD25OH 26 (L) 02/27/2020   Lab Results  Component Value Date   WBC CANCELED 08/18/2020   HGB 11.9 02/27/2020   HCT CANCELED 08/18/2020   MCV 78.9 (L) 02/27/2020   PLT 362 02/27/2020   Lab Results  Component Value Date   IRON 33 08/18/2020   TIBC 310 08/18/2020   FERRITIN 37 08/18/2020   Attestation Statements:   Reviewed by clinician on day of visit: allergies, medications, problem list, medical history, surgical history, family history,  social history, and previous encounter notes.  08/20/2020 Friedenbach, CMA, am acting as 11-11-1973 for Energy manager, DO.  I have reviewed the above documentation for accuracy and completeness, and I agree with the above. [employer], DO

## 2021-02-12 ENCOUNTER — Encounter (INDEPENDENT_AMBULATORY_CARE_PROVIDER_SITE_OTHER): Payer: Self-pay | Admitting: Family Medicine

## 2021-02-12 ENCOUNTER — Other Ambulatory Visit: Payer: Self-pay

## 2021-02-12 ENCOUNTER — Ambulatory Visit (INDEPENDENT_AMBULATORY_CARE_PROVIDER_SITE_OTHER): Payer: BC Managed Care – PPO | Admitting: Family Medicine

## 2021-02-12 VITALS — BP 116/81 | HR 85 | Temp 97.5°F | Ht 65.0 in | Wt 280.0 lb

## 2021-02-12 DIAGNOSIS — Z9189 Other specified personal risk factors, not elsewhere classified: Secondary | ICD-10-CM

## 2021-02-12 DIAGNOSIS — E8881 Metabolic syndrome: Secondary | ICD-10-CM

## 2021-02-12 DIAGNOSIS — F3289 Other specified depressive episodes: Secondary | ICD-10-CM | POA: Diagnosis not present

## 2021-02-12 DIAGNOSIS — F5081 Binge eating disorder: Secondary | ICD-10-CM | POA: Diagnosis not present

## 2021-02-12 DIAGNOSIS — Z6841 Body Mass Index (BMI) 40.0 and over, adult: Secondary | ICD-10-CM

## 2021-02-17 NOTE — Progress Notes (Signed)
Chief Complaint:   OBESITY Lauren Powell is here to discuss her progress with her obesity treatment plan along with follow-up of her obesity related diagnoses.   Today's visit was #: 25 Starting weight: 276 lbs Starting date: 08/07/2019 Today's weight: 280 lbs Today's date: 02/12/2021 Weight change since last visit: 3 lbs Total lbs lost to date: +4 lbs Body mass index is 46.59 kg/m.   Current Meal Plan: practicing portion control and making smarter food choices, such as increasing vegetables and decreasing simple carbohydrates for 80% of the time.  Current Exercise Plan: Walking for 30 minutes 7 times per week. Current Anti-Obesity Medications: Rybelsus 14 mg daily. Side effects: None.  Interim History:  Lauren Powell says she is back to work, so the schedule is helpful.  She is happy with her progress.  Assessment/Plan:   1. Metabolic syndrome Starting goal: Lose 7-10% of starting weight. She will continue to focus on protein-rich, low simple carbohydrate foods. We reviewed the importance of hydration, regular exercise for stress reduction, and restorative sleep.  We will continue to check lab work every 3 months, with 10% weight loss, or should any other concerns arise.  2. Binge eating disorder Lauren Powell is taking Vyvanse 30 mg daily for BED and will continue this dose.  People who binge eat feel as if they don't have control over how much they eat and have feelings of guilt or self-loathing after a binge eating episode. Lauren Powell estimates that about 30 percent of adults with binge eating disorder also have a history of ADHD. The FDA has approved Vyvanse as a treatment option for both ADHD and binge eating. Vyvanse targets the brain's reward center by increasing the levels of dopamine and norepinephrine, the chemicals of the brain responsible for feelings of pleasure. Mindful eating is the recommended nutritional approach to treating BED.   3. Other depression, with emotional  eating Controlled. Medication: Wellbutrin XL 150 mg daily.  Plan:  Discussed cues and consequences, how thoughts affect eating, model of thoughts, feelings, and behaviors, and strategies for change by focusing on the cue. Discussed cognitive distortions, coping thoughts, and how to change your thoughts.  4. At risk for heart disease Due to Lauren Powell's current state of health and medical condition(s), she is at a higher risk for heart disease.  This puts the patient at much greater risk to subsequently develop cardiopulmonary conditions that can significantly affect patient's quality of life in a negative manner.    At least 8 minutes were spent on counseling Lauren Powell about these concerns today. Evidence-based interventions for health behavior change were utilized today including the discussion of self monitoring techniques, problem-solving barriers, and SMART goal setting techniques.  Specifically, regarding patient's less desirable eating habits and patterns, we employed the technique of small changes when Lauren Powell has not been able to fully commit to her prudent nutritional plan.  5. Class 3 severe obesity with serious comorbidity and body mass index (BMI) of 45.0 to 49.9 in adult, unspecified obesity type (HCC)  Course: Lauren Powell is currently in the action stage of change. As such, her goal is to continue with weight loss efforts.   Nutrition goals: She has agreed to practicing portion control and making smarter food choices, such as increasing vegetables and decreasing simple carbohydrates.   Exercise goals:  As is.  Behavioral modification strategies: increasing lean protein intake, decreasing simple carbohydrates, increasing vegetables, and increasing water intake.  Lauren Powell has agreed to follow-up with our clinic in 4 weeks. She was informed  of the importance of frequent follow-up visits to maximize her success with intensive lifestyle modifications for her multiple health conditions.   Objective:    Blood pressure 116/81, pulse 85, temperature (!) 97.5 F (36.4 C), temperature source Oral, height 5\' 5"  (1.651 m), weight 280 lb (127 kg), SpO2 100 %. Body mass index is 46.59 kg/m.  General: Cooperative, alert, well developed, in no acute distress. HEENT: Conjunctivae and lids unremarkable. Cardiovascular: Regular rhythm.  Lungs: Normal work of breathing. Neurologic: No focal deficits.   Lab Results  Component Value Date   CREATININE 0.82 08/18/2020   BUN 7 08/18/2020   NA 140 08/18/2020   K 4.1 08/18/2020   CL 103 08/18/2020   CO2 23 08/18/2020   Lab Results  Component Value Date   ALT 11 08/18/2020   AST 13 08/18/2020   ALKPHOS 74 08/18/2020   BILITOT 0.5 08/18/2020   Lab Results  Component Value Date   HGBA1C 5.8 (H) 12/09/2020   HGBA1C 5.6 08/18/2020   HGBA1C 5.8 (H) 02/27/2020   HGBA1C 5.6 08/07/2019   HGBA1C 5.7 12/21/2018   Lab Results  Component Value Date   INSULIN 13.2 08/07/2019   Lab Results  Component Value Date   TSH 1.23 02/27/2020   Lab Results  Component Value Date   CHOL 164 08/18/2020   HDL 55 08/18/2020   LDLCALC 95 08/18/2020   TRIG 71 08/18/2020   CHOLHDL 3.0 08/18/2020   Lab Results  Component Value Date   VD25OH 27.4 (L) 12/09/2020   VD25OH 20.3 (L) 08/18/2020   VD25OH 26 (L) 02/27/2020   Lab Results  Component Value Date   WBC CANCELED 08/18/2020   HGB 11.9 02/27/2020   HCT CANCELED 08/18/2020   MCV 78.9 (L) 02/27/2020   PLT 362 02/27/2020   Lab Results  Component Value Date   IRON 33 08/18/2020   TIBC 310 08/18/2020   FERRITIN 37 08/18/2020   Attestation Statements:   Reviewed by clinician on day of visit: allergies, medications, problem list, medical history, surgical history, family history, social history, and previous encounter notes.  I, 08/20/2020, CMA, am acting as transcriptionist for Insurance claims handler, DO  I have reviewed the above documentation for accuracy and completeness, and I agree with the above.  Helane Rima, DO

## 2021-02-26 ENCOUNTER — Other Ambulatory Visit: Payer: Self-pay

## 2021-02-26 ENCOUNTER — Ambulatory Visit (INDEPENDENT_AMBULATORY_CARE_PROVIDER_SITE_OTHER): Payer: BC Managed Care – PPO | Admitting: Bariatrics

## 2021-02-26 ENCOUNTER — Encounter (INDEPENDENT_AMBULATORY_CARE_PROVIDER_SITE_OTHER): Payer: Self-pay | Admitting: Bariatrics

## 2021-02-26 VITALS — BP 126/86 | HR 85 | Temp 98.2°F | Ht 65.0 in | Wt 281.0 lb

## 2021-02-26 DIAGNOSIS — R7303 Prediabetes: Secondary | ICD-10-CM | POA: Diagnosis not present

## 2021-02-26 DIAGNOSIS — Z6841 Body Mass Index (BMI) 40.0 and over, adult: Secondary | ICD-10-CM | POA: Diagnosis not present

## 2021-02-26 DIAGNOSIS — F5081 Binge eating disorder: Secondary | ICD-10-CM

## 2021-02-26 NOTE — Progress Notes (Signed)
Chief Complaint:   OBESITY Lauren Powell is here to discuss her progress with her obesity treatment plan along with follow-up of her obesity related diagnoses. Lauren Powell is on practicing portion control and making smarter food choices, such as increasing vegetables and decreasing simple carbohydrates and states she is following her eating plan approximately 80% of the time. Lauren Powell states she is doing cardio and strength for 45 minutes 3 times per week.  Today's visit was #: 26 Starting weight: 276 lbs Starting date: 08/07/2019 Today's weight: 281 lbs Today's date: 02/26/2021 Total lbs lost to date: 0 Total lbs lost since last in-office visit: 0  Interim History: Lauren Powell is up 1 lb since her last visit. She has not struggled with H2O or protein.  Subjective:   1. Prediabetes Lauren Powell is currently taking Metformin and Rybelsus. She denies side effects.  2. Binge eating disorder Lauren Powell is currently taking Vyvanse and Wellbutrin.  Assessment/Plan:   1. Prediabetes Lauren Powell will continue her medications. She will continue to work on weight loss, exercise, and decreasing simple carbohydrates to help decrease the risk of diabetes.   2. Binge eating disorder Behavior modification techniques were discussed today to help Lauren Powell deal with her emotional/non-hunger eating behaviors.  Lauren Powell will continue medications. Orders and follow up as documented in patient record.    3. Obesity, current BMI 46.9 Lauren Powell is currently in the action stage of change. As such, her goal is to continue with weight loss efforts. She has agreed to practicing portion control and making smarter food choices, such as increasing vegetables and decreasing simple carbohydrates.   Lauren Powell will continue meal planning. She will decrease portion size and make good choices.  Exercise goals:  Lauren Powell will continue boot camp and she will continue cardio and resistance.  Behavioral modification strategies: increasing lean protein intake, decreasing  simple carbohydrates, increasing vegetables, increasing water intake, decreasing eating out, no skipping meals, meal planning and cooking strategies, keeping healthy foods in the home, and planning for success.  Lauren Powell has agreed to follow-up with our clinic in 3-4 weeks with Dr. Earlene Plater or Ayesha Mohair, NP. She was informed of the importance of frequent follow-up visits to maximize her success with intensive lifestyle modifications for her multiple health conditions.   Objective:   Blood pressure 126/86, pulse 85, temperature 98.2 F (36.8 C), height 5\' 5"  (1.651 m), weight 281 lb (127.5 kg), SpO2 100 %. Body mass index is 46.76 kg/m.  General: Cooperative, alert, well developed, in no acute distress. HEENT: Conjunctivae and lids unremarkable. Cardiovascular: Regular rhythm.  Lungs: Normal work of breathing. Neurologic: No focal deficits.   Lab Results  Component Value Date   CREATININE 0.82 08/18/2020   BUN 7 08/18/2020   NA 140 08/18/2020   K 4.1 08/18/2020   CL 103 08/18/2020   CO2 23 08/18/2020   Lab Results  Component Value Date   ALT 11 08/18/2020   AST 13 08/18/2020   ALKPHOS 74 08/18/2020   BILITOT 0.5 08/18/2020   Lab Results  Component Value Date   HGBA1C 5.8 (H) 12/09/2020   HGBA1C 5.6 08/18/2020   HGBA1C 5.8 (H) 02/27/2020   HGBA1C 5.6 08/07/2019   HGBA1C 5.7 12/21/2018   Lab Results  Component Value Date   INSULIN 13.2 08/07/2019   Lab Results  Component Value Date   TSH 1.23 02/27/2020   Lab Results  Component Value Date   CHOL 164 08/18/2020   HDL 55 08/18/2020   LDLCALC 95 08/18/2020   TRIG 71  08/18/2020   CHOLHDL 3.0 08/18/2020   Lab Results  Component Value Date   VD25OH 27.4 (L) 12/09/2020   VD25OH 20.3 (L) 08/18/2020   VD25OH 26 (L) 02/27/2020   Lab Results  Component Value Date   WBC CANCELED 08/18/2020   HGB 11.9 02/27/2020   HCT CANCELED 08/18/2020   MCV 78.9 (L) 02/27/2020   PLT 362 02/27/2020   Lab Results  Component  Value Date   IRON 33 08/18/2020   TIBC 310 08/18/2020   FERRITIN 37 08/18/2020   Attestation Statements:   Reviewed by clinician on day of visit: allergies, medications, problem list, medical history, surgical history, family history, social history, and previous encounter notes.  Time spent on visit including pre-visit chart review and post-visit care and charting was 20 minutes.   I, Jackson Latino, RMA, am acting as Energy manager for [employer], DO.   I have reviewed the above documentation for accuracy and completeness, and I agree with the above. Corinna Capra, DO

## 2021-02-27 ENCOUNTER — Encounter: Payer: BC Managed Care – PPO | Admitting: Adult Health

## 2021-03-03 ENCOUNTER — Other Ambulatory Visit (INDEPENDENT_AMBULATORY_CARE_PROVIDER_SITE_OTHER): Payer: Self-pay | Admitting: Family Medicine

## 2021-03-03 DIAGNOSIS — F5081 Binge eating disorder: Secondary | ICD-10-CM

## 2021-03-03 DIAGNOSIS — R7303 Prediabetes: Secondary | ICD-10-CM

## 2021-03-04 ENCOUNTER — Encounter: Payer: BC Managed Care – PPO | Admitting: Adult Health

## 2021-03-04 MED ORDER — METFORMIN HCL 500 MG PO TABS: 500.0000 mg | ORAL_TABLET | Freq: Every day | ORAL | 0 refills | Status: DC

## 2021-03-04 MED ORDER — LISDEXAMFETAMINE DIMESYLATE 30 MG PO CAPS: 30.0000 mg | ORAL_CAPSULE | Freq: Every day | ORAL | 0 refills | Status: DC

## 2021-03-04 NOTE — Telephone Encounter (Signed)
Last OV with Dr Manson Passey, Last prescribed by Dr Earlene Plater

## 2021-03-04 NOTE — Telephone Encounter (Signed)
LAST APPOINTMENT DATE: 02/26/21 NEXT APPOINTMENT DATE: 03/10/21   [id number] - [place], [place] - [address] [place] [address] Phone: [phone] Fax: [phone]  Patient is requesting a refill of the following medications: Pending Prescriptions:                       Disp   Refills   metFORMIN (GLUCOPHAGE) 500 MG tablet       90 tab*0       Sig: Take 1 tablet (500 mg total) by mouth daily.   lisdexamfetamine (VYVANSE) 30 MG capsule   30 cap*0       Sig: Take 1 capsule (30 mg total) by mouth daily.   Date last filled: 12/09/20 Previously prescribed by Dr. Earlene Plater  Lab Results      Component                Value               Date                      HGBA1C                   5.8 (H)             12/09/2020                HGBA1C                   5.6                 08/18/2020                HGBA1C                   5.8 (H)             02/27/2020           Lab Results      Component                Value               Date                      LDLCALC                  95                  08/18/2020                CREATININE               0.82                08/18/2020           Lab Results      Component                Value               Date                      VD25OH                   27.4 (L)            12/09/2020  VD25OH                   20.3 (L)            08/18/2020                VD25OH                   26 (L)              02/27/2020            BP Readings from Last 3 Encounters: 02/26/21 : 126/86 02/12/21 : 116/81 01/20/21 : 818/59

## 2021-03-10 ENCOUNTER — Ambulatory Visit (INDEPENDENT_AMBULATORY_CARE_PROVIDER_SITE_OTHER): Payer: BC Managed Care – PPO | Admitting: Family Medicine

## 2021-03-10 ENCOUNTER — Other Ambulatory Visit: Payer: Self-pay

## 2021-03-10 ENCOUNTER — Encounter (INDEPENDENT_AMBULATORY_CARE_PROVIDER_SITE_OTHER): Payer: Self-pay | Admitting: Family Medicine

## 2021-03-10 VITALS — BP 119/82 | HR 84 | Temp 98.1°F | Ht 65.0 in | Wt 278.0 lb

## 2021-03-10 DIAGNOSIS — Z6841 Body Mass Index (BMI) 40.0 and over, adult: Secondary | ICD-10-CM

## 2021-03-10 DIAGNOSIS — F5081 Binge eating disorder: Secondary | ICD-10-CM

## 2021-03-10 DIAGNOSIS — F3289 Other specified depressive episodes: Secondary | ICD-10-CM

## 2021-03-10 DIAGNOSIS — E65 Localized adiposity: Secondary | ICD-10-CM

## 2021-03-12 NOTE — Progress Notes (Signed)
Chief Complaint:   OBESITY Lauren Powell is here to discuss her progress with her obesity treatment plan along with follow-up of her obesity related diagnoses.   Today's visit was #: 27 Starting weight: 276 lbs Starting date: 08/07/2019 Today's weight: 278 lbs Today's date: 03/10/2021 Weight change since last visit: 3 lbs Total lbs lost to date: +2 lbs Body mass index is 46.26 kg/m.   Current Meal Plan: practicing portion control and making smarter food choices, such as increasing vegetables and decreasing simple carbohydrates for 80% of the time.  Current Exercise Plan: Walking for 20 minutes 4 times per week. Current Anti-Obesity Medications: Rybelsus 14 mg daily. Side effects: None.  Interim History:  Lauren Powell says she has been very busy.  Work is good.  She is setting boundaries.  She still says 'no' to injectable GLP1RA.  Assessment/Plan:   1. Visceral obesity Current visceral fat rating: 15. Visceral fat rating should be < 13. Visceral adipose tissue is a hormonally active component of total body fat. This body composition phenotype is associated with medical disorders such as metabolic syndrome, cardiovascular disease and several malignancies including prostate, breast, and colorectal cancers. Starting goal: Lose 7-10% of starting weight.   2. Binge eating disorder Lauren Powell is taking Vyvanse 30 mg daily for BED.  Plan:  The current medical regimen is effective;  continue present plan and medications.  I have consulted the [place] Controlled Substances Registry for this patient, and feel the risk/benefit ratio today is favorable for proceeding with this prescription for a controlled substance. The patient understands monitoring parameters and red flags.   3. Other depression, with emotional eating Controlled. Medication: Wellbutrin XL 150 mg daily.  Plan:  Continue Wellbutrin.  Discussed cues and consequences, how thoughts affect eating, model of thoughts, feelings, and behaviors, and  strategies for change by focusing on the cue. Discussed cognitive distortions, coping thoughts, and how to change your thoughts.  4. Obesity, current BMI 46.4  Course: Lauren Powell is currently in the action stage of change. As such, her goal is to continue with weight loss efforts.   Nutrition goals: She has agreed to practicing portion control and making smarter food choices, such as increasing vegetables and decreasing simple carbohydrates.   Exercise goals: For substantial health benefits, adults should do at least 150 minutes (2 hours and 30 minutes) a week of moderate-intensity, or 75 minutes (1 hour and 15 minutes) a week of vigorous-intensity aerobic physical activity, or an equivalent combination of moderate- and vigorous-intensity aerobic activity. Aerobic activity should be performed in episodes of at least 10 minutes, and preferably, it should be spread throughout the week.  Behavioral modification strategies: increasing lean protein intake, decreasing simple carbohydrates, increasing vegetables, and increasing water intake.  Lauren Powell has agreed to follow-up with our clinic in 4 weeks. She was informed of the importance of frequent follow-up visits to maximize her success with intensive lifestyle modifications for her multiple health conditions.   Objective:   Blood pressure 119/82, pulse 84, temperature 98.1 F (36.7 C), temperature source Oral, height 5\' 5"  (1.651 m), weight 278 lb (126.1 kg), SpO2 98 %. Body mass index is 46.26 kg/m.  General: Cooperative, alert, well developed, in no acute distress. HEENT: Conjunctivae and lids unremarkable. Cardiovascular: Regular rhythm.  Lungs: Normal work of breathing. Neurologic: No focal deficits.   Lab Results  Component Value Date   CREATININE 0.82 08/18/2020   BUN 7 08/18/2020   NA 140 08/18/2020   K 4.1 08/18/2020   CL  103 08/18/2020   CO2 23 08/18/2020   Lab Results  Component Value Date   ALT 11 08/18/2020   AST 13 08/18/2020    ALKPHOS 74 08/18/2020   BILITOT 0.5 08/18/2020   Lab Results  Component Value Date   HGBA1C 5.8 (H) 12/09/2020   HGBA1C 5.6 08/18/2020   HGBA1C 5.8 (H) 02/27/2020   HGBA1C 5.6 08/07/2019   HGBA1C 5.7 12/21/2018   Lab Results  Component Value Date   INSULIN 13.2 08/07/2019   Lab Results  Component Value Date   TSH 1.23 02/27/2020   Lab Results  Component Value Date   CHOL 164 08/18/2020   HDL 55 08/18/2020   LDLCALC 95 08/18/2020   TRIG 71 08/18/2020   CHOLHDL 3.0 08/18/2020   Lab Results  Component Value Date   VD25OH 27.4 (L) 12/09/2020   VD25OH 20.3 (L) 08/18/2020   VD25OH 26 (L) 02/27/2020   Lab Results  Component Value Date   WBC CANCELED 08/18/2020   HGB 11.9 02/27/2020   HCT CANCELED 08/18/2020   MCV 78.9 (L) 02/27/2020   PLT 362 02/27/2020   Lab Results  Component Value Date   IRON 33 08/18/2020   TIBC 310 08/18/2020   FERRITIN 37 08/18/2020   Attestation Statements:   Reviewed by clinician on day of visit: allergies, medications, problem list, medical history, surgical history, family history, social history, and previous encounter notes.  Time spent on visit including pre-visit chart review and post-visit care and charting was 33 minutes.   I, Insurance claims handler, CMA, am acting as transcriptionist for Helane Rima, DO  I have reviewed the above documentation for accuracy and completeness, and I agree with the above. -  Helane Rima, DO, MS, FAAFP, DABOM - Family and Bariatric Medicine.

## 2021-03-13 MED ORDER — LISDEXAMFETAMINE DIMESYLATE 30 MG PO CAPS: 30.0000 mg | ORAL_CAPSULE | Freq: Every day | ORAL | 0 refills | Status: DC

## 2021-03-19 ENCOUNTER — Encounter: Payer: BC Managed Care – PPO | Admitting: Adult Health

## 2021-03-23 ENCOUNTER — Encounter (INDEPENDENT_AMBULATORY_CARE_PROVIDER_SITE_OTHER): Payer: Self-pay | Admitting: Family Medicine

## 2021-03-24 ENCOUNTER — Ambulatory Visit (INDEPENDENT_AMBULATORY_CARE_PROVIDER_SITE_OTHER): Payer: BC Managed Care – PPO | Admitting: Family Medicine

## 2021-03-25 ENCOUNTER — Other Ambulatory Visit: Payer: Self-pay

## 2021-03-26 ENCOUNTER — Ambulatory Visit (INDEPENDENT_AMBULATORY_CARE_PROVIDER_SITE_OTHER): Payer: BC Managed Care – PPO | Admitting: Adult Health

## 2021-03-26 ENCOUNTER — Other Ambulatory Visit: Payer: Self-pay

## 2021-03-26 ENCOUNTER — Ambulatory Visit (INDEPENDENT_AMBULATORY_CARE_PROVIDER_SITE_OTHER): Payer: BC Managed Care – PPO

## 2021-03-26 ENCOUNTER — Encounter: Payer: Self-pay | Admitting: Adult Health

## 2021-03-26 VITALS — BP 130/86 | HR 109 | Temp 98.5°F | Ht 64.5 in | Wt 285.0 lb

## 2021-03-26 DIAGNOSIS — G8929 Other chronic pain: Secondary | ICD-10-CM

## 2021-03-26 DIAGNOSIS — M79672 Pain in left foot: Secondary | ICD-10-CM

## 2021-03-26 DIAGNOSIS — E65 Localized adiposity: Secondary | ICD-10-CM | POA: Diagnosis not present

## 2021-03-26 DIAGNOSIS — Z0000 Encounter for general adult medical examination without abnormal findings: Secondary | ICD-10-CM | POA: Diagnosis not present

## 2021-03-26 DIAGNOSIS — E559 Vitamin D deficiency, unspecified: Secondary | ICD-10-CM

## 2021-03-26 DIAGNOSIS — F3289 Other specified depressive episodes: Secondary | ICD-10-CM

## 2021-03-26 DIAGNOSIS — R7303 Prediabetes: Secondary | ICD-10-CM

## 2021-03-26 DIAGNOSIS — F5081 Binge eating disorder: Secondary | ICD-10-CM

## 2021-03-26 NOTE — Progress Notes (Signed)
Subjective:    Patient ID: Lauren Powell, female    DOB: [birth year], 35 y.o.   MRN: [id number]  HPI Patient presents for yearly preventative medicine examination. She is a pleasant 35 year old female who  has a past medical history of Allergy, Eczema, Female infertility, History of anemia, Obesity, and Vitamin D deficiency.  Depression-  currently prescribed Wellbutrin 150 mg.  Does feel well controlled with her medication  [facility] loss management -[facility]  Is taking Vyvanse 30 mg daily for binge eating disorder.  She has refused injectable GLP-1 RA's.  Is taking Rybelsus 14 mg daily. Has not lost weight. Is eating healthy and exercising. Is joining a new diabetic program at the [facility] next week   Vitamin D Deficiency -vitamin D 50,000 units weekly.  This is managed by [facility].  Last vitamin D in July 2022 was 27.4  Glucose Intolerance - takes Metformin 500 mg daily  Lab Results  Component Value Date   HGBA1C 5.8 (H) 12/09/2020   Left heel pain -this has been a chronic issue for her.  Only feels pain when she initially says starts ambulating.  Pain is always located on the left heel and does not radiate.  All immunizations and health maintenance protocols were reviewed with the patient and needed orders were placed.  Appropriate screening laboratory values were ordered for the patient including screening of hyperlipidemia, renal function and hepatic function.  Medication reconciliation,  past medical history, social history, problem list and allergies were reviewed in detail with the patient  Goals were established with regard to weight loss, exercise, and  diet in compliance with medications  Review of Systems  Constitutional: Negative.   HENT: Negative.    Eyes: Negative.   Respiratory: Negative.    Cardiovascular: Negative.   Gastrointestinal: Negative.   Endocrine: Negative.   Genitourinary: Negative.    Musculoskeletal: Negative.   Skin: Negative.   Allergic/Immunologic: Negative.   Neurological: Negative.   Hematological: Negative.   Psychiatric/Behavioral: Negative.     Past Medical History:  Diagnosis Date   Allergy    Eczema    Female infertility    History of anemia    Obesity    Vitamin D deficiency     Social History   Socioeconomic History   Marital status: Divorced    Spouse name: Not on file   Number of children: Not on file   Years of education: Not on file   Highest education level: Not on file  Occupational History   Occupation: Midwife  Tobacco Use   Smoking status: Never   Smokeless tobacco: Never  Vaping Use   Vaping Use: Never used  Substance and Sexual Activity   Alcohol use: No   Drug use: No   Sexual activity: Yes    Partners: Male    Birth control/protection: None  Other Topics Concern   Not on file  Social History Narrative   Patient lives with son   Teaches pre school and kindergarten.    Social Determinants of Health   Financial Resource Strain: Not on file  Food Insecurity: Not on file  Transportation Needs: Not on file  Physical Activity: Not on file  Stress: Not on file  Social Connections: Not on file  Intimate Partner Violence: Not on file    Past Surgical History:  Procedure Laterality Date   CESAREAN SECTION     DILATATION & CURETTAGE/HYSTEROSCOPY WITH MYOSURE N/A 08/06/2019  Procedure: DILATATION & CURETTAGE/HYSTEROSCOPY WITH MYOSURE;  Surgeon: Maxie Better, MD;  Location: [facility];  Service: Gynecology;  Laterality: N/A;   TONSILLECTOMY  04/30/2019   TOOTH EXTRACTION N/A     Family History  Problem Relation Age of Onset   Alcohol abuse Mother    Drug abuse Mother    Bipolar disorder Mother    Depression Mother    Hypertension Paternal Aunt    Hypertension Paternal Grandmother    Prostate cancer Paternal Grandfather     No Known Allergies  Current Outpatient Medications  on File Prior to Visit  Medication Sig Dispense Refill   azelastine (ASTELIN) 0.1 % nasal spray Place 2 sprays into both nostrils 2 (two) times daily. Use in each nostril as directed 30 mL 3   buPROPion (WELLBUTRIN XL) 150 MG 24 hr tablet TAKE 1 TABLET BY MOUTH EVERY DAY 90 tablet 0   lisdexamfetamine (VYVANSE) 30 MG capsule Take 1 capsule (30 mg total) by mouth daily. 30 capsule 0   metFORMIN (GLUCOPHAGE) 500 MG tablet Take 1 tablet (500 mg total) by mouth daily. 90 tablet 0   Olopatadine HCl 0.2 % SOLN INSTILL 1 DROP INTO AFFECTED EYE EVERY DAY     Semaglutide (RYBELSUS) 14 MG TABS Take 14 mg by mouth daily. 30 tablet 1   triamcinolone (KENALOG) 0.1 % Apply 1 application topically 2 (two) times daily. 30 g 1   Vitamin D, Ergocalciferol, (DRISDOL) 1.25 MG (50000 UNIT) CAPS capsule Take 1 capsule (50,000 Units total) by mouth 2 (two) times a week. 8 capsule 0   No current facility-administered medications on file prior to visit.    BP 130/86   Pulse (!) 109   Temp 98.5 F (36.9 C) (Oral)   Ht 5' 4.5" (1.638 m)   Wt 285 lb (129.3 kg)   SpO2 98%   BMI 48.16 kg/m        Objective:   Physical Exam Vitals and nursing note reviewed.  Constitutional:      Appearance: Normal appearance.  Cardiovascular:     Rate and Rhythm: Normal rate and regular rhythm.     Pulses: Normal pulses.     Heart sounds: Normal heart sounds.  Pulmonary:     Effort: Pulmonary effort is normal.     Breath sounds: Normal breath sounds.  Musculoskeletal:     Comments: Has left heel pain with palpation. No abnormality felt  Neurological:     General: No focal deficit present.     Mental Status: She is alert and oriented to person, place, and time.  Psychiatric:        Mood and Affect: Mood normal.        Behavior: Behavior normal.        Thought Content: Thought content normal.        Judgment: Judgment normal.       Assessment & Plan:  1. Routine general medical examination at a health care  facility - Follow up in one year or sooner if needed - Continue to follow up with weight loss management  - CBC with Differential/Platelet; Future - Comprehensive metabolic panel; Future - TSH; Future - Hemoglobin A1c; Future - VITAMIN D 25 Hydroxy (Vit-D Deficiency, Fractures); Future  2. Chronic heel pain, left - likely bone spur  - DG Foot Complete Left; Future  3. Visceral obesity - continues to refuse injectables. Follow up with Weight loss management as directed - CBC with Differential/Platelet; Future - Comprehensive metabolic panel; Future -  TSH; Future - Hemoglobin A1c; Future - VITAMIN D 25 Hydroxy (Vit-D Deficiency, Fractures); Future  4. Binge eating disorder - Continue Vyvance - CBC with Differential/Platelet; Future - Comprehensive metabolic panel; Future - TSH; Future - Hemoglobin A1c; Future - VITAMIN D 25 Hydroxy (Vit-D Deficiency, Fractures); Future  5. Other depression, with emotional eating - Continue Wellbutrin   6. Prediabetes  - CBC with Differential/Platelet; Future - Comprehensive metabolic panel; Future - TSH; Future - Hemoglobin A1c; Future - VITAMIN D 25 Hydroxy (Vit-D Deficiency, Fractures); Future  7. Vitamin D deficiency  - CBC with Differential/Platelet; Future - Comprehensive metabolic panel; Future - TSH; Future - Hemoglobin A1c; Future - VITAMIN D 25 Hydroxy (Vit-D Deficiency, Fractures); Future  Shirline Frees, NP

## 2021-03-27 ENCOUNTER — Other Ambulatory Visit: Payer: BC Managed Care – PPO

## 2021-03-27 ENCOUNTER — Other Ambulatory Visit (INDEPENDENT_AMBULATORY_CARE_PROVIDER_SITE_OTHER): Payer: BC Managed Care – PPO

## 2021-03-27 DIAGNOSIS — R7303 Prediabetes: Secondary | ICD-10-CM

## 2021-03-27 DIAGNOSIS — E65 Localized adiposity: Secondary | ICD-10-CM

## 2021-03-27 DIAGNOSIS — F5081 Binge eating disorder: Secondary | ICD-10-CM

## 2021-03-27 DIAGNOSIS — Z0000 Encounter for general adult medical examination without abnormal findings: Secondary | ICD-10-CM | POA: Diagnosis not present

## 2021-03-27 DIAGNOSIS — E559 Vitamin D deficiency, unspecified: Secondary | ICD-10-CM

## 2021-03-27 LAB — COMPREHENSIVE METABOLIC PANEL
ALT: 12 U/L (ref 0–35)
AST: 13 U/L (ref 0–37)
Albumin: 4.1 g/dL (ref 3.5–5.2)
Alkaline Phosphatase: 79 U/L (ref 39–117)
BUN: 7 mg/dL (ref 6–23)
CO2: 31 mEq/L (ref 19–32)
Calcium: 9.2 mg/dL (ref 8.4–10.5)
Chloride: 102 mEq/L (ref 96–112)
Creatinine, Ser: 0.87 mg/dL (ref 0.40–1.20)
GFR: 85.71 mL/min (ref >60.00)
Glucose, Bld: 100 mg/dL — ABNORMAL HIGH (ref 70–99)
Potassium: 3.6 mEq/L (ref 3.5–5.1)
Sodium: 139 mEq/L (ref 135–145)
Total Bilirubin: 0.9 mg/dL (ref 0.2–1.2)
Total Protein: 7.4 g/dL (ref 6.0–8.3)

## 2021-03-27 LAB — VITAMIN D 25 HYDROXY (VIT D DEFICIENCY, FRACTURES): VITD: 23.21 ng/mL — ABNORMAL LOW (ref 30.00–100.00)

## 2021-03-27 LAB — CBC WITH DIFFERENTIAL/PLATELET
Basophils Absolute: 0.1 10*3/uL (ref 0.0–0.1)
Basophils Relative: 0.6 % (ref 0.0–3.0)
Eosinophils Absolute: 0.3 10*3/uL (ref 0.0–0.7)
Eosinophils Relative: 3.3 % (ref 0.0–5.0)
HCT: 37 % (ref 36.0–46.0)
Hemoglobin: 11.9 g/dL — ABNORMAL LOW (ref 12.0–15.0)
Lymphocytes Relative: 18.9 % (ref 12.0–46.0)
Lymphs Abs: 1.9 10*3/uL (ref 0.7–4.0)
MCHC: 32.2 g/dL (ref 30.0–36.0)
MCV: 77.7 fl — ABNORMAL LOW (ref 78.0–100.0)
Monocytes Absolute: 0.4 10*3/uL (ref 0.1–1.0)
Monocytes Relative: 4.5 % (ref 3.0–12.0)
Neutro Abs: 7.1 10*3/uL (ref 1.4–7.7)
Neutrophils Relative %: 72.7 % (ref 43.0–77.0)
Platelets: 299 10*3/uL (ref 150.0–400.0)
RBC: 4.77 Mil/uL (ref 3.87–5.11)
RDW: 15.7 % — ABNORMAL HIGH (ref 11.5–15.5)
WBC: 9.8 10*3/uL (ref 4.0–10.5)

## 2021-03-27 LAB — TSH: TSH: 1.05 u[IU]/mL (ref 0.35–5.50)

## 2021-03-27 LAB — HEMOGLOBIN A1C: Hgb A1c MFr Bld: 6 % (ref 4.6–6.5)

## 2021-04-14 ENCOUNTER — Ambulatory Visit (INDEPENDENT_AMBULATORY_CARE_PROVIDER_SITE_OTHER): Payer: BC Managed Care – PPO | Admitting: Family Medicine

## 2021-04-14 ENCOUNTER — Encounter (INDEPENDENT_AMBULATORY_CARE_PROVIDER_SITE_OTHER): Payer: Self-pay | Admitting: Family Medicine

## 2021-04-14 ENCOUNTER — Other Ambulatory Visit: Payer: Self-pay

## 2021-04-14 VITALS — BP 128/82 | HR 90 | Temp 98.1°F | Ht 65.0 in | Wt 282.0 lb

## 2021-04-14 DIAGNOSIS — E559 Vitamin D deficiency, unspecified: Secondary | ICD-10-CM

## 2021-04-14 DIAGNOSIS — R7301 Impaired fasting glucose: Secondary | ICD-10-CM | POA: Diagnosis not present

## 2021-04-14 DIAGNOSIS — F5081 Binge eating disorder: Secondary | ICD-10-CM

## 2021-04-14 DIAGNOSIS — Z6841 Body Mass Index (BMI) 40.0 and over, adult: Secondary | ICD-10-CM

## 2021-04-14 MED ORDER — TIRZEPATIDE 2.5 MG/0.5ML ~~LOC~~ SOAJ: 2.5000 mg | SUBCUTANEOUS | 0 refills | Status: DC

## 2021-04-14 MED ORDER — VITAMIN D (ERGOCALCIFEROL) 1.25 MG (50000 UNIT) PO CAPS
50000.0000 [IU] | ORAL_CAPSULE | ORAL | 0 refills | Status: DC
Start: 2021-04-16 — End: 2021-05-19

## 2021-04-14 MED ORDER — LISDEXAMFETAMINE DIMESYLATE 30 MG PO CAPS: 30.0000 mg | ORAL_CAPSULE | Freq: Every day | ORAL | 0 refills | Status: DC

## 2021-04-16 NOTE — Progress Notes (Signed)
Chief Complaint:   OBESITY Lauren Powell is here to discuss her progress with her obesity treatment plan along with follow-up of her obesity related diagnoses. See Medical Weight Management Flowsheet for complete bioelectrical impedance results.  Today's visit was #: 28 Starting weight: 276 lbs Starting date: 08/07/2019 Weight change since last visit: +4 lbs Total lbs lost to date: +6 lbs  Nutrition Plan: Practicing portion control and making smarter food choices, such as increasing vegetables and decreasing simple carbohydrates for 80% of the time. Activity: None. Anti-obesity medications: Rybelsus 14 mg daily. Reported side effects: None.  Interim History: Lauren Powell had a visit with her PCP.  GLP1RA encouraged and she is ready to try it!  Assessment/Plan:   1. Impaired fasting glucose Start Mounjaro 2.5 mg subcutaneously weekly, as per below. Risk versus benefits of medication reviewed. The patient understands monitoring parameters and red flags.   - Start tirzepatide [facility]) 2.5 MG/0.5ML Pen; Inject 2.5 mg into the skin once a week.  Dispense: 2 mL; Refill: 0  2. Vitamin D deficiency Not at goal. She is taking vitamin D 50,000 IU twice weekly.  Plan: Continue to take prescription Vitamin D @50 ,000 IU twice weekly as prescribed.  Follow-up for routine testing of Vitamin D, at least 2-3 times per year to avoid over-replacement.  Lab Results  Component Value Date   VD25OH 23.21 (L) 03/27/2021   VD25OH 27.4 (L) 12/09/2020   VD25OH 20.3 (L) 08/18/2020   - Refill Vitamin D, Ergocalciferol, (DRISDOL) 1.25 MG (50000 UNIT) CAPS capsule; Take 1 capsule (50,000 Units total) by mouth 2 (two) times a week.  Dispense: 8 capsule; Refill: 0  3. Binge eating disorder Lauren Powell is taking Vyvanse 30 mg daily for BED.  She will continue Vyvanse at this does.  Okay to stop Wellbutrin.   The goals for treatment of BED are to reduce eating binges and to achieve healthy eating habits. Because binge  eating can correlate with negative emotions, treatment may also address any other mental-health issues, such as depression.  People who binge eat feel as if they don't have control over how much they eat and have feelings of guilt or self-loathing after a binge eating episode.  The FDA has approved Vyvanse as a treatment option for binge eating disorder. Vyvanse targets the brain's reward center by increasing the levels of dopamine and norepinephrine, the chemicals of the brain responsible for feelings of pleasure.  Mindful eating is the recommended nutritional approach to treating BED.   - Refill lisdexamfetamine (VYVANSE) 30 MG capsule; Take 1 capsule (30 mg total) by mouth daily.  Dispense: 30 capsule; Refill: 0  I have consulted the [place] Controlled Substances Registry for this patient, and feel the risk/benefit ratio today is favorable for proceeding with this prescription for a controlled substance. The patient understands monitoring parameters and red flags.   4. Obesity, current BMI 47.0  Course: Lauren Powell is currently in the action stage of change. As such, her goal is to continue with weight loss efforts.   Nutrition goals: She has agreed to practicing portion control and making smarter food choices, such as increasing vegetables and decreasing simple carbohydrates.   Exercise goals: For substantial health benefits, adults should do at least 150 minutes (2 hours and 30 minutes) a week of moderate-intensity, or 75 minutes (1 hour and 15 minutes) a week of vigorous-intensity aerobic physical activity, or an equivalent combination of moderate- and vigorous-intensity aerobic activity. Aerobic activity should be performed in episodes of at least 10  minutes, and preferably, it should be spread throughout the week.  Behavioral modification strategies: increasing lean protein intake, decreasing simple carbohydrates, and increasing vegetables.  Lauren Powell has agreed to follow-up with our clinic in 4 weeks.  She was informed of the importance of frequent follow-up visits to maximize her success with intensive lifestyle modifications for her multiple health conditions.   Objective:   Blood pressure 128/82, pulse 90, temperature 98.1 F (36.7 C), temperature source Oral, height 5\' 5"  (1.651 m), weight 282 lb (127.9 kg), SpO2 99 %. Body mass index is 46.93 kg/m.  General: Cooperative, alert, well developed, in no acute distress. HEENT: Conjunctivae and lids unremarkable. Cardiovascular: Regular rhythm.  Lungs: Normal work of breathing. Neurologic: No focal deficits.   Lab Results  Component Value Date   CREATININE 0.87 03/27/2021   BUN 7 03/27/2021   NA 139 03/27/2021   K 3.6 03/27/2021   CL 102 03/27/2021   CO2 31 03/27/2021   Lab Results  Component Value Date   ALT 12 03/27/2021   AST 13 03/27/2021   ALKPHOS 79 03/27/2021   BILITOT 0.9 03/27/2021   Lab Results  Component Value Date   HGBA1C 6.0 03/27/2021   HGBA1C 5.8 (H) 12/09/2020   HGBA1C 5.6 08/18/2020   HGBA1C 5.8 (H) 02/27/2020   HGBA1C 5.6 08/07/2019   Lab Results  Component Value Date   INSULIN 13.2 08/07/2019   Lab Results  Component Value Date   TSH 1.05 03/27/2021   Lab Results  Component Value Date   CHOL 164 08/18/2020   HDL 55 08/18/2020   LDLCALC 95 08/18/2020   TRIG 71 08/18/2020   CHOLHDL 3.0 08/18/2020   Lab Results  Component Value Date   VD25OH 23.21 (L) 03/27/2021   VD25OH 27.4 (L) 12/09/2020   VD25OH 20.3 (L) 08/18/2020   Lab Results  Component Value Date   WBC 9.8 03/27/2021   HGB 11.9 (L) 03/27/2021   HCT 37.0 03/27/2021   MCV 77.7 (L) 03/27/2021   PLT 299.0 03/27/2021   Lab Results  Component Value Date   IRON 33 08/18/2020   TIBC 310 08/18/2020   FERRITIN 37 08/18/2020   Attestation Statements:   Reviewed by clinician on day of visit: allergies, medications, problem list, medical history, surgical history, family history, social history, and previous encounter  notes.  I, 08/20/2020, CMA, am acting as transcriptionist for Insurance claims handler, DO  I have reviewed the above documentation for accuracy and completeness, and I agree with the above. -  Helane Rima, DO, MS, FAAFP, DABOM - Family and Bariatric Medicine.

## 2021-04-20 ENCOUNTER — Encounter (INDEPENDENT_AMBULATORY_CARE_PROVIDER_SITE_OTHER): Payer: Self-pay | Admitting: Family Medicine

## 2021-04-21 NOTE — Telephone Encounter (Signed)
Dr.Wallace °

## 2021-04-23 ENCOUNTER — Encounter (INDEPENDENT_AMBULATORY_CARE_PROVIDER_SITE_OTHER): Payer: Self-pay | Admitting: Family Medicine

## 2021-04-24 ENCOUNTER — Other Ambulatory Visit (INDEPENDENT_AMBULATORY_CARE_PROVIDER_SITE_OTHER): Payer: Self-pay | Admitting: Family Medicine

## 2021-04-24 DIAGNOSIS — R7301 Impaired fasting glucose: Secondary | ICD-10-CM

## 2021-04-27 ENCOUNTER — Telehealth (INDEPENDENT_AMBULATORY_CARE_PROVIDER_SITE_OTHER): Payer: Self-pay | Admitting: Family Medicine

## 2021-04-27 ENCOUNTER — Encounter (INDEPENDENT_AMBULATORY_CARE_PROVIDER_SITE_OTHER): Payer: Self-pay

## 2021-04-27 NOTE — Telephone Encounter (Signed)
Prior authorization has been started for Mounjaro. Will notify patient and provider once response is received.  

## 2021-04-27 NOTE — Telephone Encounter (Signed)
Dr.Wallace °

## 2021-04-27 NOTE — Telephone Encounter (Signed)
Prior authorization for Mounjaro denied. Patient sent mychart message. 

## 2021-05-04 ENCOUNTER — Other Ambulatory Visit: Payer: Self-pay

## 2021-05-04 ENCOUNTER — Ambulatory Visit (INDEPENDENT_AMBULATORY_CARE_PROVIDER_SITE_OTHER): Payer: BC Managed Care – PPO | Admitting: Family Medicine

## 2021-05-04 ENCOUNTER — Encounter (INDEPENDENT_AMBULATORY_CARE_PROVIDER_SITE_OTHER): Payer: Self-pay | Admitting: Family Medicine

## 2021-05-04 VITALS — BP 117/82 | HR 83 | Temp 97.9°F | Ht 65.0 in | Wt 282.0 lb

## 2021-05-04 DIAGNOSIS — Z6841 Body Mass Index (BMI) 40.0 and over, adult: Secondary | ICD-10-CM | POA: Diagnosis not present

## 2021-05-04 DIAGNOSIS — R7301 Impaired fasting glucose: Secondary | ICD-10-CM

## 2021-05-04 DIAGNOSIS — F5081 Binge eating disorder: Secondary | ICD-10-CM | POA: Diagnosis not present

## 2021-05-04 MED ORDER — VYVANSE 60 MG PO CHEW: 60.0000 mg | CHEWABLE_TABLET | Freq: Every morning | ORAL | 0 refills | Status: DC

## 2021-05-04 MED ORDER — OZEMPIC (0.25 OR 0.5 MG/DOSE) 2 MG/1.5ML ~~LOC~~ SOPN: 0.2500 mg | PEN_INJECTOR | SUBCUTANEOUS | 0 refills | Status: DC

## 2021-05-04 NOTE — Progress Notes (Signed)
Chief Complaint:   OBESITY Lauren Powell Lauren Powell here to discuss her progress with her obesity treatment plan along with follow-up of her obesity related diagnoses. See Medical Weight Management Flowsheet for complete bioelectrical impedance results.  Today's visit was #: 29 Starting weight: 276 lbs Starting date: 08/07/2019 Weight change since last visit: 0 Total lbs lost to date: +6 lbs  Nutrition Plan: Practicing portion control and making smarter food choices, such as increasing vegetables and decreasing simple carbohydrates for 80% of the time. Activity: Strength training/cardio/HIIT for 30 minutes 3 times per week.  Interim History: Lauren Powell says she was unable to obtain [facility] due to pharmacy denial.  Assessment/Plan:   1. Impaired fasting glucose, with polyphagia Not at goal. Current treatment: None.    Plan:  Start Ozempic 0.25 mg subcutaneously weekly.  She will continue to focus on protein-rich, low simple carbohydrate foods. We reviewed the importance of hydration, regular exercise for stress reduction, and restorative sleep.  - Start Semaglutide,0.25 or 0.5MG /DOS, (OZEMPIC, 0.25 OR 0.5 MG/DOSE,) 2 MG/1.5ML SOPN; Inject 0.25 mg into the skin once a week.  Dispense: 1.5 mL; Refill: 0  2. Binge eating disorder Carman Lauren Powell taking Vyvanse 30 mg daily for BED.  She will continue Vyvanse at this dose.  The goals for treatment of BED are to reduce eating binges and to achieve healthy eating habits. Because binge eating can correlate with negative emotions, treatment may also address any other mental-health issues, such as depression. People who binge eat feel as if they don't have control over how much they eat and have feelings of guilt or self-loathing after a binge eating episode.  The FDA has approved Vyvanse as a treatment option for binge eating disorder. Vyvanse targets the brain's reward center by increasing the levels of dopamine and norepinephrine, the chemicals of the brain  responsible for feelings of pleasure.  Mindful eating Lauren Powell the recommended nutritional approach to treating BED.   - Refill Lisdexamfetamine Dimesylate (VYVANSE) 60 MG CHEW; Chew 60 mg by mouth every morning.  Dispense: 30 tablet; Refill: 0  I have consulted the [place] Controlled Substances Registry for this patient, and feel the risk/benefit ratio today Lauren Powell favorable for proceeding with this prescription for a controlled substance. The patient understands monitoring parameters and red flags.   3. Class 3 severe obesity with serious comorbidity and body mass index (BMI) of 45.0 to 49.9 in adult, unspecified obesity type (HCC)  Course: [name] Lauren Powell currently in the action stage of change. As such, her goal Lauren Powell to continue with weight loss efforts.   Nutrition goals: She has agreed to practicing portion control and making smarter food choices, such as increasing vegetables and decreasing simple carbohydrates.   Exercise goals:  As Lauren Powell.  Behavioral modification strategies: increasing lean protein intake, decreasing simple carbohydrates, increasing vegetables, and increasing water intake.  [place] has agreed to follow-up with our clinic in 3 weeks. She was informed of the importance of frequent follow-up visits to maximize her success with intensive lifestyle modifications for her multiple health conditions.   Objective:   Blood pressure 117/82, pulse 83, temperature 97.9 F (36.6 C), temperature source Oral, height 5\' 5"  (1.651 m), weight 282 lb (127.9 kg), SpO2 100 %. Body mass index Lauren Powell 46.93 kg/m.  General: Cooperative, alert, well developed, in no acute distress. HEENT: Conjunctivae and lids unremarkable. Cardiovascular: Regular rhythm.  Lungs: Normal work of breathing. Neurologic: No focal deficits.   Lab Results  Component Value Date   CREATININE 0.87 03/27/2021  BUN 7 03/27/2021   NA 139 03/27/2021   K 3.6 03/27/2021   CL 102 03/27/2021   CO2 31 03/27/2021   Lab Results  Component  Value Date   ALT 12 03/27/2021   AST 13 03/27/2021   ALKPHOS 79 03/27/2021   BILITOT 0.9 03/27/2021   Lab Results  Component Value Date   HGBA1C 6.0 03/27/2021   HGBA1C 5.8 (H) 12/09/2020   HGBA1C 5.6 08/18/2020   HGBA1C 5.8 (H) 02/27/2020   HGBA1C 5.6 08/07/2019   Lab Results  Component Value Date   INSULIN 13.2 08/07/2019   Lab Results  Component Value Date   TSH 1.05 03/27/2021   Lab Results  Component Value Date   CHOL 164 08/18/2020   HDL 55 08/18/2020   LDLCALC 95 08/18/2020   TRIG 71 08/18/2020   CHOLHDL 3.0 08/18/2020   Lab Results  Component Value Date   VD25OH 23.21 (L) 03/27/2021   VD25OH 27.4 (L) 12/09/2020   VD25OH 20.3 (L) 08/18/2020   Lab Results  Component Value Date   WBC 9.8 03/27/2021   HGB 11.9 (L) 03/27/2021   HCT 37.0 03/27/2021   MCV 77.7 (L) 03/27/2021   PLT 299.0 03/27/2021   Lab Results  Component Value Date   IRON 33 08/18/2020   TIBC 310 08/18/2020   FERRITIN 37 08/18/2020   Attestation Statements:   Reviewed by clinician on day of visit: allergies, medications, problem list, medical history, surgical history, family history, social history, and previous encounter notes.  I, Insurance claims handler, CMA, am acting as transcriptionist for Helane Rima, DO  I have reviewed the above documentation for accuracy and completeness, and I agree with the above. -  Helane Rima, DO, MS, FAAFP, DABOM - Family and Bariatric Medicine.

## 2021-05-07 ENCOUNTER — Telehealth (INDEPENDENT_AMBULATORY_CARE_PROVIDER_SITE_OTHER): Payer: Self-pay | Admitting: Family Medicine

## 2021-05-07 ENCOUNTER — Encounter (INDEPENDENT_AMBULATORY_CARE_PROVIDER_SITE_OTHER): Payer: Self-pay

## 2021-05-07 NOTE — Telephone Encounter (Signed)
Prior authorization approved for Vyvanse. Patient sent mychart message. °

## 2021-05-19 ENCOUNTER — Encounter (INDEPENDENT_AMBULATORY_CARE_PROVIDER_SITE_OTHER): Payer: Self-pay | Admitting: Family Medicine

## 2021-05-19 ENCOUNTER — Ambulatory Visit (INDEPENDENT_AMBULATORY_CARE_PROVIDER_SITE_OTHER): Payer: BC Managed Care – PPO | Admitting: Family Medicine

## 2021-05-19 ENCOUNTER — Other Ambulatory Visit: Payer: Self-pay

## 2021-05-19 VITALS — BP 124/75 | HR 91 | Temp 97.6°F | Ht 65.0 in | Wt 279.0 lb

## 2021-05-19 DIAGNOSIS — R7301 Impaired fasting glucose: Secondary | ICD-10-CM | POA: Diagnosis not present

## 2021-05-19 DIAGNOSIS — E559 Vitamin D deficiency, unspecified: Secondary | ICD-10-CM | POA: Diagnosis not present

## 2021-05-19 DIAGNOSIS — Z6841 Body Mass Index (BMI) 40.0 and over, adult: Secondary | ICD-10-CM

## 2021-05-19 DIAGNOSIS — F5081 Binge eating disorder: Secondary | ICD-10-CM

## 2021-05-20 MED ORDER — VITAMIN D (ERGOCALCIFEROL) 1.25 MG (50000 UNIT) PO CAPS: 50000.0000 [IU] | ORAL_CAPSULE | ORAL | 0 refills | Status: DC

## 2021-05-20 NOTE — Progress Notes (Signed)
Chief Complaint:   OBESITY Lauren Powell is here to discuss her progress with her obesity treatment plan along with follow-up of her obesity related diagnoses. See Medical Weight Management Flowsheet for complete bioelectrical impedance results.  Today's visit was #: 30 Starting weight: 276 lbs Starting date: 08/07/2019 Weight change since last visit: 3 lbs Total lbs lost to date: +3 lbs  Nutrition Plan: Practicing portion control and making smarter food choices, such as increasing vegetables and decreasing simple carbohydrates for 100% of the time. Activity: None. Anti-obesity medications: Ozempic 0.25 mg subcutaneously weekly. Reported side effects: None.  Interim History: Lauren Powell will increase her Ozempic to 0.5 mg weekly in January.  She says she will be going to [place] next week.  She denies side effects with Ozempic 0.25 mg.  She says she is happy with her weight loss.  Assessment/Plan:   1. Vitamin D deficiency Not at goal. She is taking vitamin D 50,000 IU weekly.  Plan: Continue to take prescription Vitamin D @50 ,000 IU every week as prescribed.  Follow-up for routine testing of Vitamin D, at least 2-3 times per year to avoid over-replacement.  Lab Results  Component Value Date   VD25OH 23.21 (L) 03/27/2021   VD25OH 27.4 (L) 12/09/2020   VD25OH 20.3 (L) 08/18/2020   - Refill Vitamin D, Ergocalciferol, (DRISDOL) 1.25 MG (50000 UNIT) CAPS capsule; Take 1 capsule (50,000 Units total) by mouth 2 (two) times a week.  Dispense: 8 capsule; Refill: 0  2. Impaired fasting glucose, with polyphagia Controlled. Current treatment: Ozempic 0.25 mg subcutaneously weekly.    Plan:  Continue Ozempic 0.25 mg subcutaneously weekly. She will continue to focus on protein-rich, low simple carbohydrate foods. We reviewed the importance of hydration, regular exercise for stress reduction, and restorative sleep.  3. Binge eating disorder Lauren Powell is taking Vyvanse 60 mg daily for BED.  She will  continue Vyvanse at this dose.  The goals for treatment of BED are to reduce eating binges and to achieve healthy eating habits. Because binge eating can correlate with negative emotions, treatment may also address any other mental-health issues, such as depression.  People who binge eat feel as if they don't have control over how much they eat and have feelings of guilt or self-loathing after a binge eating episode.  The FDA has approved Vyvanse as a treatment option for binge eating disorder. Vyvanse targets the brain's reward center by increasing the levels of dopamine and norepinephrine, the chemicals of the brain responsible for feelings of pleasure.  Mindful eating is the recommended nutritional approach to treating BED.   4. Obesity, current BMI 46.5  Course: Lauren Powell is currently in the action stage of change. As such, her goal is to continue with weight loss efforts.   Nutrition goals: She has agreed to practicing portion control and making smarter food choices, such as increasing vegetables and decreasing simple carbohydrates.   Exercise goals:  Increase NEAT.  Behavioral modification strategies: increasing lean protein intake, decreasing simple carbohydrates, increasing vegetables, and increasing water intake.  Lauren Powell has agreed to follow-up with our clinic in 3-4 weeks. She was informed of the importance of frequent follow-up visits to maximize her success with intensive lifestyle modifications for her multiple health conditions.   Objective:   Blood pressure 124/75, pulse 91, temperature 97.6 F (36.4 C), temperature source Oral, height 5\' 5"  (1.651 m), weight 279 lb (126.6 kg), SpO2 100 %. Body mass index is 46.43 kg/m.  General: Cooperative, alert, well developed, in no  acute distress. HEENT: Conjunctivae and lids unremarkable. Cardiovascular: Regular rhythm.  Lungs: Normal work of breathing. Neurologic: No focal deficits.   Lab Results  Component Value Date   CREATININE  0.87 03/27/2021   BUN 7 03/27/2021   NA 139 03/27/2021   K 3.6 03/27/2021   CL 102 03/27/2021   CO2 31 03/27/2021   Lab Results  Component Value Date   ALT 12 03/27/2021   AST 13 03/27/2021   ALKPHOS 79 03/27/2021   BILITOT 0.9 03/27/2021   Lab Results  Component Value Date   HGBA1C 6.0 03/27/2021   HGBA1C 5.8 (H) 12/09/2020   HGBA1C 5.6 08/18/2020   HGBA1C 5.8 (H) 02/27/2020   HGBA1C 5.6 08/07/2019   Lab Results  Component Value Date   INSULIN 13.2 08/07/2019   Lab Results  Component Value Date   TSH 1.05 03/27/2021   Lab Results  Component Value Date   CHOL 164 08/18/2020   HDL 55 08/18/2020   LDLCALC 95 08/18/2020   TRIG 71 08/18/2020   CHOLHDL 3.0 08/18/2020   Lab Results  Component Value Date   VD25OH 23.21 (L) 03/27/2021   VD25OH 27.4 (L) 12/09/2020   VD25OH 20.3 (L) 08/18/2020   Lab Results  Component Value Date   WBC 9.8 03/27/2021   HGB 11.9 (L) 03/27/2021   HCT 37.0 03/27/2021   MCV 77.7 (L) 03/27/2021   PLT 299.0 03/27/2021   Lab Results  Component Value Date   IRON 33 08/18/2020   TIBC 310 08/18/2020   FERRITIN 37 08/18/2020   Attestation Statements:   Reviewed by clinician on day of visit: allergies, medications, problem list, medical history, surgical history, family history, social history, and previous encounter notes.  I, Insurance claims handler, CMA, am acting as transcriptionist for Helane Rima, DO  I have reviewed the above documentation for accuracy and completeness, and I agree with the above. -  Helane Rima, DO, MS, FAAFP, DABOM - Family and Bariatric Medicine.

## 2021-05-26 ENCOUNTER — Encounter: Payer: Self-pay | Admitting: Adult Health

## 2021-05-26 ENCOUNTER — Ambulatory Visit: Payer: BC Managed Care – PPO | Admitting: Adult Health

## 2021-05-26 VITALS — BP 122/100 | HR 109 | Temp 98.2°F | Wt 280.8 lb

## 2021-05-26 DIAGNOSIS — M25561 Pain in right knee: Secondary | ICD-10-CM | POA: Diagnosis not present

## 2021-05-26 DIAGNOSIS — T148XXA Other injury of unspecified body region, initial encounter: Secondary | ICD-10-CM | POA: Diagnosis not present

## 2021-05-26 MED ORDER — DIAZEPAM 5 MG PO TABS: 5.0000 mg | ORAL_TABLET | Freq: Two times a day (BID) | ORAL | 0 refills | Status: DC | PRN

## 2021-05-26 MED ORDER — METHYLPREDNISOLONE 4 MG PO TBPK: ORAL_TABLET | ORAL | 0 refills | Status: DC

## 2021-05-26 NOTE — Progress Notes (Signed)
Subjective:    Patient ID: Lauren Powell, female    DOB: 01-08-1986, 35 y.o.   MRN: [id number]  HPI  35 year old female who  has a past medical history of Allergy, Eczema, Female infertility, History of anemia, Obesity, and Vitamin D deficiency.  She presents to the office today for an acute issue.  Her symptoms started Saturday with lower lumbar pain that radiates across to her back.  Pain is worse with bending, twisting, laying down, and changing positions.  She has not had any signs or symptoms of a urinary tract infection.  She finds it difficult to walk.  Additionally Saturday she was getting out of a car and had sudden right-sided knee pain.  Pain feels as though it coming from behind the kneecap.  She denies issues with walking with in regards to her knee pain.  Its more painful when going up stairs.  She has been using Tylenol and a heating pad.  Reports that heating pad and hot showers helps with her low back pain.  Review of Systems See HPI   Past Medical History:  Diagnosis Date   Allergy    Eczema    Female infertility    History of anemia    Obesity    Vitamin D deficiency     Social History   Socioeconomic History   Marital status: Divorced    Spouse name: Not on file   Number of children: Not on file   Years of education: Not on file   Highest education level: Not on file  Occupational History   Occupation: Midwife  Tobacco Use   Smoking status: Never   Smokeless tobacco: Never  Vaping Use   Vaping Use: Never used  Substance and Sexual Activity   Alcohol use: No   Drug use: No   Sexual activity: Yes    Partners: Male    Birth control/protection: None  Other Topics Concern   Not on file  Social History Narrative   Patient lives with son   Teaches pre school and kindergarten.    Social Determinants of Health   Financial Resource Strain: Not on file  Food Insecurity: Not on file  Transportation Needs: Not on file  Physical  Activity: Not on file  Stress: Not on file  Social Connections: Not on file  Intimate Partner Violence: Not on file    Past Surgical History:  Procedure Laterality Date   CESAREAN SECTION     DILATATION & CURETTAGE/HYSTEROSCOPY WITH MYOSURE N/A 08/06/2019   Procedure: DILATATION & CURETTAGE/HYSTEROSCOPY WITH MYOSURE;  Surgeon: Maxie Better, MD;  Location:  [facility];  Service: Gynecology;  Laterality: N/A;   TONSILLECTOMY  04/30/2019   TOOTH EXTRACTION N/A     Family History  Problem Relation Age of Onset   Alcohol abuse Mother    Drug abuse Mother    Bipolar disorder Mother    Depression Mother    Hypertension Paternal Aunt    Hypertension Paternal Grandmother    Prostate cancer Paternal Grandfather     No Known Allergies  Current Outpatient Medications on File Prior to Visit  Medication Sig Dispense Refill   azelastine (ASTELIN) 0.1 % nasal spray Place 2 sprays into both nostrils 2 (two) times daily. Use in each nostril as directed 30 mL 3   Lisdexamfetamine Dimesylate (VYVANSE) 60 MG CHEW Chew 60 mg by mouth every morning. 30 tablet 0   Olopatadine HCl 0.2 % SOLN INSTILL 1 DROP INTO AFFECTED EYE  EVERY DAY     Semaglutide,0.25 or 0.5MG /DOS, (OZEMPIC, 0.25 OR 0.5 MG/DOSE,) 2 MG/1.5ML SOPN Inject 0.25 mg into the skin once a week. 1.5 mL 0   triamcinolone (KENALOG) 0.1 % Apply 1 application topically 2 (two) times daily. 30 g 1   Vitamin D, Ergocalciferol, (DRISDOL) 1.25 MG (50000 UNIT) CAPS capsule Take 1 capsule (50,000 Units total) by mouth 2 (two) times a week. 8 capsule 0   No current facility-administered medications on file prior to visit.    BP (!) 122/100 (BP Location: Right Arm, Patient Position: Sitting, Cuff Size: Large)    Pulse (!) 109    Temp 98.2 F (36.8 C) (Oral)    Wt 280 lb 12.8 oz (127.4 kg)    SpO2 99%    BMI 46.73 kg/m       Objective:   Physical Exam Vitals and nursing note reviewed.  Constitutional:      Appearance:  Normal appearance.  Cardiovascular:     Rate and Rhythm: Normal rate and regular rhythm.     Pulses: Normal pulses.     Heart sounds: Normal heart sounds.  Pulmonary:     Effort: Pulmonary effort is normal.     Breath sounds: Normal breath sounds.  Musculoskeletal:     Lumbar back: Spasms and tenderness present. No bony tenderness. Decreased range of motion.     Right knee: No swelling, deformity, bony tenderness or crepitus. Normal range of motion. No tenderness. No LCL laxity, MCL laxity, ACL laxity or PCL laxity. Normal alignment, normal meniscus and normal patellar mobility. Normal pulse.     Instability Tests: Anterior drawer test negative. Posterior drawer test negative. Anterior Lachman test negative. Medial McMurray test negative and lateral McMurray test negative.  Skin:    General: Skin is warm and dry.  Neurological:     General: No focal deficit present.     Mental Status: She is alert and oriented to person, place, and time.  Psychiatric:        Mood and Affect: Mood normal.        Behavior: Behavior normal.        Thought Content: Thought content normal.        Judgment: Judgment normal.      Assessment & Plan:  1. Muscle strain -No spinal tenderness.  No concern UTI.  Will prescribe Valium as a muscle relaxer and a Medrol Dosepak.  Continue with heating pad, and anti-inflammatory - diazepam (VALIUM) 5 MG tablet; Take 1 tablet (5 mg total) by mouth every 12 (twelve) hours as needed for anxiety.  Dispense: 30 tablet; Refill: 0 - methylPREDNISolone (MEDROL DOSEPAK) 4 MG TBPK tablet; Take as directed  Dispense: 21 tablet; Refill: 0  2. Acute pain of right knee -likely bruised.  Advised anti-inflammatories such as Motrin.  Rest, elevate, compression  - methylPREDNISolone (MEDROL DOSEPAK) 4 MG TBPK tablet; Take as directed  Dispense: 21 tablet; Refill: 0  Shirline Frees, NP

## 2021-05-26 NOTE — Progress Notes (Signed)
° °  Subjective:    Patient ID: Lauren Powell, female    DOB: 1985-12-27, 35 y.o.   MRN: [id number]  HPI    Review of Systems     Objective:   Physical Exam        Assessment & Plan:

## 2021-05-30 ENCOUNTER — Other Ambulatory Visit (INDEPENDENT_AMBULATORY_CARE_PROVIDER_SITE_OTHER): Payer: Self-pay | Admitting: Family Medicine

## 2021-05-30 DIAGNOSIS — R7301 Impaired fasting glucose: Secondary | ICD-10-CM

## 2021-06-11 ENCOUNTER — Other Ambulatory Visit: Payer: Self-pay

## 2021-06-11 ENCOUNTER — Other Ambulatory Visit (INDEPENDENT_AMBULATORY_CARE_PROVIDER_SITE_OTHER): Payer: Self-pay | Admitting: Family Medicine

## 2021-06-11 ENCOUNTER — Encounter (INDEPENDENT_AMBULATORY_CARE_PROVIDER_SITE_OTHER): Payer: Self-pay | Admitting: Family Medicine

## 2021-06-11 ENCOUNTER — Ambulatory Visit (INDEPENDENT_AMBULATORY_CARE_PROVIDER_SITE_OTHER): Payer: BC Managed Care – PPO | Admitting: Family Medicine

## 2021-06-11 VITALS — BP 117/84 | HR 94 | Temp 97.5°F | Ht 65.0 in | Wt 275.0 lb

## 2021-06-11 DIAGNOSIS — E65 Localized adiposity: Secondary | ICD-10-CM

## 2021-06-11 DIAGNOSIS — Z6841 Body Mass Index (BMI) 40.0 and over, adult: Secondary | ICD-10-CM

## 2021-06-11 DIAGNOSIS — F5081 Binge eating disorder: Secondary | ICD-10-CM | POA: Diagnosis not present

## 2021-06-11 DIAGNOSIS — R7301 Impaired fasting glucose: Secondary | ICD-10-CM | POA: Diagnosis not present

## 2021-06-11 MED ORDER — OZEMPIC (1 MG/DOSE) 4 MG/3ML ~~LOC~~ SOPN: 1.0000 mg | PEN_INJECTOR | SUBCUTANEOUS | 2 refills | Status: DC

## 2021-06-11 MED ORDER — OZEMPIC (0.25 OR 0.5 MG/DOSE) 2 MG/1.5ML ~~LOC~~ SOPN: 0.5000 mg | PEN_INJECTOR | SUBCUTANEOUS | 0 refills | Status: DC

## 2021-06-15 NOTE — Telephone Encounter (Signed)
LOV w/ Dr. Wallace

## 2021-06-15 NOTE — Progress Notes (Signed)
Chief Complaint:   OBESITY Lauren Powell Powell here to discuss her progress with her obesity treatment plan along with follow-up of her obesity related diagnoses. See Medical Weight Management Flowsheet for complete bioelectrical impedance results.  Today's visit was #: 31 Starting weight: 276 lbs Starting date: 08/07/2019 Weight change since last visit: 4 lbs Total lbs lost to date: 1 lb Total weight loss percentage to date: -0.36%  Nutrition Plan: Practicing portion control and making smarter food choices, such Lauren increasing vegetables and decreasing simple carbohydrates for 80% of the time. Activity: Increased walking. Anti-obesity medications: Ozempic 0.5 mg subcutaneously weekly. Reported side effects: None.  Interim History: Taking Ozempic 0.5 mg.  No side effects.  Tolerating well.  Assessment/Plan:   1. Impaired fasting glucose, with polyphagia Lauren Powell Powell taking Ozempic 0.5 mg subcutaneously weekly.  Plan:  Increase Ozempic to 1 mg subcutaneously weekly, Lauren per below.  - Refill Semaglutide,0.25 or 0.5MG /DOS, (OZEMPIC, 0.25 OR 0.5 MG/DOSE,) 2 MG/1.5ML SOPN; Inject 0.5 mg into the skin once a week.  Dispense: 1.5 mL; Refill: 0 - Increase Semaglutide, 1 MG/DOSE, (OZEMPIC, 1 MG/DOSE,) 4 MG/3ML SOPN; Inject 1 mg into the skin once a week.  Dispense: 3 mL; Refill: 2  2. Visceral obesity Current visceral fat rating: 14. Visceral fat rating goal Powell < 13. Visceral adipose tissue Powell a hormonally active component of total body fat. This body composition phenotype Powell associated with medical disorders such Lauren metabolic syndrome, cardiovascular disease, and several malignancies including prostate, breast, and colorectal cancers. Starting goal: Lose 7-10% of starting weight.   3. Binge eating disorder Lauren Powell Powell taking Vyvanse 60 mg daily for BED.  Lauren Powell will continue Vyvanse at this dose.  4. Obesity, current BMI 45.9  Course: Lauren Powell Powell currently in the action stage of change. Lauren such, her goal Powell  to continue with weight loss efforts.   Nutrition goals: Lauren Powell has agreed to practicing portion control and making smarter food choices, such Lauren increasing vegetables and decreasing simple carbohydrates.   Exercise goals:  Lauren Powell.  Behavioral modification strategies: increasing lean protein intake, decreasing simple carbohydrates, increasing vegetables, increasing water intake, and decreasing liquid calories.  Lauren Powell has agreed to follow-up with our clinic in 2-3 weeks. Lauren Powell was informed of the importance of frequent follow-up visits to maximize her success with intensive lifestyle modifications for her multiple health conditions.   Objective:   Blood pressure 117/84, pulse 94, temperature (!) 97.5 F (36.4 C), temperature source Oral, height 5\' 5"  (1.651 m), weight 275 lb (124.7 kg), SpO2 98 %. Body mass index Powell 45.76 kg/m.  General: Cooperative, alert, well developed, in no acute distress. HEENT: Conjunctivae and lids unremarkable. Cardiovascular: Regular rhythm.  Lungs: Normal work of breathing. Neurologic: No focal deficits.   Lab Results  Component Value Date   CREATININE 0.87 03/27/2021   BUN 7 03/27/2021   NA 139 03/27/2021   K 3.6 03/27/2021   CL 102 03/27/2021   CO2 31 03/27/2021   Lab Results  Component Value Date   ALT 12 03/27/2021   AST 13 03/27/2021   ALKPHOS 79 03/27/2021   BILITOT 0.9 03/27/2021   Lab Results  Component Value Date   HGBA1C 6.0 03/27/2021   HGBA1C 5.8 (H) 12/09/2020   HGBA1C 5.6 08/18/2020   HGBA1C 5.8 (H) 02/27/2020   HGBA1C 5.6 08/07/2019   Lab Results  Component Value Date   INSULIN 13.2 08/07/2019   Lab Results  Component Value Date   TSH 1.05 03/27/2021  Lab Results  Component Value Date   CHOL 164 08/18/2020   HDL 55 08/18/2020   LDLCALC 95 08/18/2020   TRIG 71 08/18/2020   CHOLHDL 3.0 08/18/2020   Lab Results  Component Value Date   VD25OH 23.21 (L) 03/27/2021   VD25OH 27.4 (L) 12/09/2020   VD25OH 20.3 (L)  08/18/2020   Lab Results  Component Value Date   WBC 9.8 03/27/2021   HGB 11.9 (L) 03/27/2021   HCT 37.0 03/27/2021   MCV 77.7 (L) 03/27/2021   PLT 299.0 03/27/2021   Lab Results  Component Value Date   IRON 33 08/18/2020   TIBC 310 08/18/2020   FERRITIN 37 08/18/2020   Attestation Statements:   Reviewed by clinician on day of visit: allergies, medications, problem list, medical history, surgical history, family history, social history, and previous encounter notes.  I, Water quality scientist, CMA, am acting Lauren transcriptionist for Briscoe Deutscher, DO  I have reviewed the above documentation for accuracy and completeness, and I agree with the above. -  Briscoe Deutscher, DO, MS, FAAFP, DABOM - Family and Bariatric Medicine.

## 2021-06-16 MED ORDER — VYVANSE 60 MG PO CHEW: 60.0000 mg | CHEWABLE_TABLET | Freq: Every morning | ORAL | 0 refills | Status: DC

## 2021-07-02 ENCOUNTER — Ambulatory Visit (INDEPENDENT_AMBULATORY_CARE_PROVIDER_SITE_OTHER): Payer: BC Managed Care – PPO | Admitting: Family Medicine

## 2021-07-02 ENCOUNTER — Other Ambulatory Visit: Payer: Self-pay

## 2021-07-02 ENCOUNTER — Encounter (INDEPENDENT_AMBULATORY_CARE_PROVIDER_SITE_OTHER): Payer: Self-pay | Admitting: Family Medicine

## 2021-07-02 VITALS — BP 113/81 | HR 93 | Temp 98.1°F | Ht 65.0 in | Wt 264.0 lb

## 2021-07-02 DIAGNOSIS — R7301 Impaired fasting glucose: Secondary | ICD-10-CM

## 2021-07-02 DIAGNOSIS — F5081 Binge eating disorder: Secondary | ICD-10-CM | POA: Diagnosis not present

## 2021-07-02 DIAGNOSIS — E559 Vitamin D deficiency, unspecified: Secondary | ICD-10-CM | POA: Diagnosis not present

## 2021-07-02 DIAGNOSIS — E669 Obesity, unspecified: Secondary | ICD-10-CM | POA: Diagnosis not present

## 2021-07-02 DIAGNOSIS — Z6841 Body Mass Index (BMI) 40.0 and over, adult: Secondary | ICD-10-CM

## 2021-07-23 ENCOUNTER — Encounter (INDEPENDENT_AMBULATORY_CARE_PROVIDER_SITE_OTHER): Payer: Self-pay | Admitting: Family Medicine

## 2021-07-23 ENCOUNTER — Other Ambulatory Visit: Payer: Self-pay

## 2021-07-23 ENCOUNTER — Ambulatory Visit (INDEPENDENT_AMBULATORY_CARE_PROVIDER_SITE_OTHER): Payer: BC Managed Care – PPO | Admitting: Family Medicine

## 2021-07-23 VITALS — BP 126/84 | HR 94 | Temp 98.0°F | Ht 65.0 in | Wt 261.0 lb

## 2021-07-23 DIAGNOSIS — E65 Localized adiposity: Secondary | ICD-10-CM | POA: Diagnosis not present

## 2021-07-23 DIAGNOSIS — E669 Obesity, unspecified: Secondary | ICD-10-CM

## 2021-07-23 DIAGNOSIS — Z6841 Body Mass Index (BMI) 40.0 and over, adult: Secondary | ICD-10-CM

## 2021-07-23 DIAGNOSIS — F5081 Binge eating disorder: Secondary | ICD-10-CM | POA: Diagnosis not present

## 2021-07-23 DIAGNOSIS — R7301 Impaired fasting glucose: Secondary | ICD-10-CM

## 2021-07-28 MED ORDER — VYVANSE 60 MG PO CHEW: 60.0000 mg | CHEWABLE_TABLET | Freq: Every morning | ORAL | 0 refills | Status: DC

## 2021-07-28 NOTE — Progress Notes (Signed)
Chief Complaint:   OBESITY Lauren Powell is here to discuss her progress with her obesity treatment plan along with follow-up of her obesity related diagnoses. See Medical Weight Management Flowsheet for complete bioelectrical impedance results.  Today's visit was #: 32 Starting weight: 276 lbs Starting date: 08/07/2019 Weight change since last visit: 11 lbs Total lbs lost to date: 12 lbs Total weight loss percentage to date: -4.35%  Nutrition Plan: Practicing portion control and making smarter food choices, such as increasing vegetables and decreasing simple carbohydrates for 100% of the time. Activity: Walking for 30+ minutes 6 times per week. Anti-obesity medications: Ozempic 0.5 mg subcutaneously weekly. Reported side effects: None.  Interim History: Lauren Powell is taking Ozempic 0.5 mg subcutaneously weekly.  She has had no soda or sweets or bread.  She says she is doing well.  Assessment/Plan:   1. Impaired fasting glucose, with polyphagia Controlled. Current treatment: Ozempic 0.5 mg subcutaneously weekly.    Plan:  Continue Ozempic 0.5 mg subcutaneously weekly. She will continue to focus on protein-rich, low simple carbohydrate foods. We reviewed the importance of hydration, regular exercise for stress reduction, and restorative sleep.  2. Vitamin D deficiency Not at goal. She is taking vitamin D 50,000 IU weekly.  Plan: Continue to take prescription Vitamin D @50 ,000 IU every week as prescribed.  Follow-up for routine testing of Vitamin D, at least 2-3 times per year to avoid over-replacement.  Lab Results  Component Value Date   VD25OH 23.21 (L) 03/27/2021   VD25OH 27.4 (L) 12/09/2020   VD25OH 20.3 (L) 08/18/2020   3. Binge eating disorder Lauren Powell is taking Vyvanse 60 mg daily for BED and will continue this dose.  The goals for treatment of BED are to reduce eating binges and to achieve healthy eating habits. Because binge eating can correlate with negative emotions, treatment  may also address any other mental-health issues, such as depression. People who binge eat feel as if they don't have control over how much they eat and have feelings of guilt or self-loathing after a binge eating episode.  The FDA has approved Vyvanse as a treatment option for binge eating disorder. Vyvanse targets the brain's reward center by increasing the levels of dopamine and norepinephrine, the chemicals of the brain responsible for feelings of pleasure.  Mindful eating is the recommended nutritional approach to treating BED.   4. Obesity, current BMI 44.1  Course: Lauren Powell is currently in the action stage of change. As such, her goal is to continue with weight loss efforts.   Nutrition goals: She has agreed to practicing portion control and making smarter food choices, such as increasing vegetables and decreasing simple carbohydrates.   Exercise goals:  As is.  Behavioral modification strategies: increasing lean protein intake, decreasing simple carbohydrates, increasing vegetables, and increasing water intake.  Lauren Powell has agreed to follow-up with our clinic in 4 weeks. She was informed of the importance of frequent follow-up visits to maximize her success with intensive lifestyle modifications for her multiple health conditions.   Objective:   Blood pressure 113/81, pulse 93, temperature 98.1 F (36.7 C), temperature source Oral, height 5\' 5"  (1.651 m), weight 264 lb (119.7 kg), SpO2 99 %. Body mass index is 43.93 kg/m.  General: Cooperative, alert, well developed, in no acute distress. HEENT: Conjunctivae and lids unremarkable. Cardiovascular: Regular rhythm.  Lungs: Normal work of breathing. Neurologic: No focal deficits.   Lab Results  Component Value Date   CREATININE 0.87 03/27/2021   BUN 7 03/27/2021  NA 139 03/27/2021   K 3.6 03/27/2021   CL 102 03/27/2021   CO2 31 03/27/2021   Lab Results  Component Value Date   ALT 12 03/27/2021   AST 13 03/27/2021   ALKPHOS  79 03/27/2021   BILITOT 0.9 03/27/2021   Lab Results  Component Value Date   HGBA1C 6.0 03/27/2021   HGBA1C 5.8 (H) 12/09/2020   HGBA1C 5.6 08/18/2020   HGBA1C 5.8 (H) 02/27/2020   HGBA1C 5.6 08/07/2019   Lab Results  Component Value Date   INSULIN 13.2 08/07/2019   Lab Results  Component Value Date   TSH 1.05 03/27/2021   Lab Results  Component Value Date   CHOL 164 08/18/2020   HDL 55 08/18/2020   LDLCALC 95 08/18/2020   TRIG 71 08/18/2020   CHOLHDL 3.0 08/18/2020   Lab Results  Component Value Date   VD25OH 23.21 (L) 03/27/2021   VD25OH 27.4 (L) 12/09/2020   VD25OH 20.3 (L) 08/18/2020   Lab Results  Component Value Date   WBC 9.8 03/27/2021   HGB 11.9 (L) 03/27/2021   HCT 37.0 03/27/2021   MCV 77.7 (L) 03/27/2021   PLT 299.0 03/27/2021   Lab Results  Component Value Date   IRON 33 08/18/2020   TIBC 310 08/18/2020   FERRITIN 37 08/18/2020   Attestation Statements:   Reviewed by clinician on day of visit: allergies, medications, problem list, medical history, surgical history, family history, social history, and previous encounter notes.  I, Insurance claims handler, CMA, am acting as transcriptionist for Helane Rima, DO  I have reviewed the above documentation for accuracy and completeness, and I agree with the above. -  Helane Rima, DO, MS, FAAFP, DABOM - Family and Bariatric Medicine.

## 2021-07-28 NOTE — Progress Notes (Signed)
Chief Complaint:   OBESITY Lauren Powell is here to discuss her progress with her obesity treatment plan along with follow-up of her obesity related diagnoses. See Medical Weight Management Flowsheet for complete bioelectrical impedance results.  Today's visit was #: 33 Starting weight: 276 lbs Starting date: 08/07/2019 Weight change since last visit: 3 lbs Total lbs lost to date: 15 lbs Total weight loss percentage to date: -5.43%  Nutrition Plan: Practicing portion control and making smarter food choices, such as increasing vegetables and decreasing simple carbohydrates for 80% of the time.  Activity: Cardio for 30 minutes 7 times per week. Anti-obesity medications: Ozempic 1 mg subcutaneously weekly. Reported side effects: None.  Interim History: Lauren Powell is down 21 pounds since starting Ozempic.  She says she is happy with her progress.  Denies polyphagia.  Denies side effects.  Assessment/Plan:   1. Impaired fasting glucose, with polyphagia Controlled. Current treatment: Ozempic 1 mg subcutaneously weekly.    Plan: She will continue to focus on protein-rich, low simple carbohydrate foods. We reviewed the importance of hydration, regular exercise for stress reduction, and restorative sleep.  2. Visceral obesity Current visceral fat rating: 14. Visceral fat rating goal is < 13. Visceral adipose tissue is a hormonally active component of total body fat. This body composition phenotype is associated with medical disorders such as metabolic syndrome, cardiovascular disease, and several malignancies including prostate, breast, and colorectal cancers. Starting goal: Lose 7-10% of starting weight.   3. Binge eating disorder Lauren Powell is taking Vyvanse 60 mg daily for BED.  She will continue Vyvanse at this dose.  Refill sent to pharmacy today.  The goals for treatment of BED are to reduce eating binges and to achieve healthy eating habits. Because binge eating can correlate with negative emotions,  treatment may also address any other mental-health issues, such as depression.  People who binge eat feel as if they don't have control over how much they eat and have feelings of guilt or self-loathing after a binge eating episode.  The FDA has approved Vyvanse as a treatment option for binge eating disorder. Vyvanse targets the brain's reward center by increasing the levels of dopamine and norepinephrine, the chemicals of the brain responsible for feelings of pleasure.  Mindful eating is the recommended nutritional approach to treating BED.   - Refill Lisdexamfetamine Dimesylate (VYVANSE) 60 MG CHEW; Chew 60 mg by mouth every morning.  Dispense: 30 tablet; Refill: 0  I have consulted the [place] Controlled Substances Registry for this patient, and feel the risk/benefit ratio today is favorable for proceeding with this prescription for a controlled substance. The patient understands monitoring parameters and red flags.   4. Obesity, current BMI 43.4  Course: Lauren Powell is currently in the action stage of change. As such, her goal is to continue with weight loss efforts.   Nutrition goals: She has agreed to practicing portion control and making smarter food choices, such as increasing vegetables and decreasing simple carbohydrates.   Exercise goals:  As is.  Behavioral modification strategies: increasing lean protein intake, decreasing simple carbohydrates, and increasing vegetables.  Lauren Powell has agreed to follow-up with our clinic in 2 weeks. She was informed of the importance of frequent follow-up visits to maximize her success with intensive lifestyle modifications for her multiple health conditions.   Objective:   Blood pressure 126/84, pulse 94, temperature 98 F (36.7 C), temperature source Oral, height 5\' 5"  (1.651 m), weight 261 lb (118.4 kg), SpO2 100 %. Body mass index is 43.43  kg/m.  General: Cooperative, alert, well developed, in no acute distress. HEENT: Conjunctivae and lids  unremarkable. Cardiovascular: Regular rhythm.  Lungs: Normal work of breathing. Neurologic: No focal deficits.   Lab Results  Component Value Date   CREATININE 0.87 03/27/2021   BUN 7 03/27/2021   NA 139 03/27/2021   K 3.6 03/27/2021   CL 102 03/27/2021   CO2 31 03/27/2021   Lab Results  Component Value Date   ALT 12 03/27/2021   AST 13 03/27/2021   ALKPHOS 79 03/27/2021   BILITOT 0.9 03/27/2021   Lab Results  Component Value Date   HGBA1C 6.0 03/27/2021   HGBA1C 5.8 (H) 12/09/2020   HGBA1C 5.6 08/18/2020   HGBA1C 5.8 (H) 02/27/2020   HGBA1C 5.6 08/07/2019   Lab Results  Component Value Date   INSULIN 13.2 08/07/2019   Lab Results  Component Value Date   TSH 1.05 03/27/2021   Lab Results  Component Value Date   CHOL 164 08/18/2020   HDL 55 08/18/2020   LDLCALC 95 08/18/2020   TRIG 71 08/18/2020   CHOLHDL 3.0 08/18/2020   Lab Results  Component Value Date   VD25OH 23.21 (L) 03/27/2021   VD25OH 27.4 (L) 12/09/2020   VD25OH 20.3 (L) 08/18/2020   Lab Results  Component Value Date   WBC 9.8 03/27/2021   HGB 11.9 (L) 03/27/2021   HCT 37.0 03/27/2021   MCV 77.7 (L) 03/27/2021   PLT 299.0 03/27/2021   Lab Results  Component Value Date   IRON 33 08/18/2020   TIBC 310 08/18/2020   FERRITIN 37 08/18/2020   Attestation Statements:   Reviewed by clinician on day of visit: allergies, medications, problem list, medical history, surgical history, family history, social history, and previous encounter notes.  I, Insurance claims handler, CMA, am acting as transcriptionist for Helane Rima, DO  I have reviewed the above documentation for accuracy and completeness, and I agree with the above. -  Helane Rima, DO, MS, FAAFP, DABOM - Family and Bariatric Medicine.

## 2021-08-11 ENCOUNTER — Ambulatory Visit (INDEPENDENT_AMBULATORY_CARE_PROVIDER_SITE_OTHER): Payer: BC Managed Care – PPO | Admitting: Family Medicine

## 2021-08-11 ENCOUNTER — Other Ambulatory Visit: Payer: Self-pay

## 2021-08-11 ENCOUNTER — Encounter (INDEPENDENT_AMBULATORY_CARE_PROVIDER_SITE_OTHER): Payer: Self-pay | Admitting: Family Medicine

## 2021-08-11 VITALS — BP 120/85 | HR 85 | Temp 97.6°F | Ht 65.0 in | Wt 254.0 lb

## 2021-08-11 DIAGNOSIS — Z6841 Body Mass Index (BMI) 40.0 and over, adult: Secondary | ICD-10-CM

## 2021-08-11 DIAGNOSIS — R7303 Prediabetes: Secondary | ICD-10-CM | POA: Diagnosis not present

## 2021-08-11 DIAGNOSIS — E669 Obesity, unspecified: Secondary | ICD-10-CM

## 2021-08-11 DIAGNOSIS — D649 Anemia, unspecified: Secondary | ICD-10-CM

## 2021-08-11 DIAGNOSIS — E559 Vitamin D deficiency, unspecified: Secondary | ICD-10-CM

## 2021-08-12 LAB — ANEMIA PANEL
Ferritin: 39 ng/mL (ref 15–150)
Folate, Hemolysate: 366 ng/mL
Folate, RBC: 888 ng/mL (ref >498)
Hematocrit: 41.2 % (ref 34.0–46.6)
Iron Saturation: 12 % — ABNORMAL LOW (ref 15–55)
Iron: 37 ug/dL (ref 27–159)
Retic Ct Pct: 1.1 % (ref 0.6–2.6)
Total Iron Binding Capacity: 306 ug/dL (ref 250–450)
UIBC: 269 ug/dL (ref 131–425)
Vitamin B-12: 578 pg/mL (ref 232–1245)

## 2021-08-12 LAB — CBC WITH DIFFERENTIAL/PLATELET
Basophils Absolute: 0.1 10*3/uL (ref 0.0–0.2)
Basos: 1 %
EOS (ABSOLUTE): 0.2 10*3/uL (ref 0.0–0.4)
Eos: 3 %
Hemoglobin: 13.2 g/dL (ref 11.1–15.9)
Immature Grans (Abs): 0 10*3/uL (ref 0.0–0.1)
Immature Granulocytes: 0 %
Lymphocytes Absolute: 2.5 10*3/uL (ref 0.7–3.1)
Lymphs: 34 %
MCH: 25.4 pg — ABNORMAL LOW (ref 26.6–33.0)
MCHC: 32 g/dL (ref 31.5–35.7)
MCV: 79 fL (ref 79–97)
Monocytes Absolute: 0.3 10*3/uL (ref 0.1–0.9)
Monocytes: 4 %
Neutrophils Absolute: 4.3 10*3/uL (ref 1.4–7.0)
Neutrophils: 58 %
Platelets: 358 10*3/uL (ref 150–450)
RBC: 5.19 x10E6/uL (ref 3.77–5.28)
RDW: 15.1 % (ref 11.7–15.4)
WBC: 7.4 10*3/uL (ref 3.4–10.8)

## 2021-08-12 LAB — COMPREHENSIVE METABOLIC PANEL
ALT: 10 IU/L (ref 0–32)
AST: 12 IU/L (ref 0–40)
Albumin/Globulin Ratio: 1.4 (ref 1.2–2.2)
Albumin: 4.1 g/dL (ref 3.8–4.8)
Alkaline Phosphatase: 81 IU/L (ref 44–121)
BUN/Creatinine Ratio: 8 — ABNORMAL LOW (ref 9–23)
BUN: 6 mg/dL (ref 6–20)
Bilirubin Total: 0.6 mg/dL (ref 0.0–1.2)
CO2: 24 mmol/L (ref 20–29)
Calcium: 9.1 mg/dL (ref 8.7–10.2)
Chloride: 101 mmol/L (ref 96–106)
Creatinine, Ser: 0.8 mg/dL (ref 0.57–1.00)
Globulin, Total: 3 g/dL (ref 1.5–4.5)
Glucose: 89 mg/dL (ref 70–99)
Potassium: 3.6 mmol/L (ref 3.5–5.2)
Sodium: 140 mmol/L (ref 134–144)
Total Protein: 7.1 g/dL (ref 6.0–8.5)
eGFR: 98 mL/min/{1.73_m2} (ref >59)

## 2021-08-12 LAB — HEMOGLOBIN A1C
Est. average glucose Bld gHb Est-mCnc: 117 mg/dL
Hgb A1c MFr Bld: 5.7 % — ABNORMAL HIGH (ref 4.8–5.6)

## 2021-08-12 LAB — VITAMIN D 25 HYDROXY (VIT D DEFICIENCY, FRACTURES): Vit D, 25-Hydroxy: 24.1 ng/mL — ABNORMAL LOW (ref 30.0–100.0)

## 2021-08-24 NOTE — Progress Notes (Signed)
Chief Complaint:   OBESITY Lauren Powell is here to discuss her progress with her obesity treatment plan along with follow-up of her obesity related diagnoses. See Medical Weight Management Flowsheet for complete bioelectrical impedance results.  Today's visit was #: 34  Starting weight: 276 lbs Starting date: 08/07/2019 Weight change since last visit: 7 lbs Total lbs lost to date: 22 lbs Total weight loss percentage to date: -7.97%  Nutrition Plan: Practicing portion control and making smarter food choices, such as increasing vegetables and decreasing simple carbohydrates for 80% of the time. Activity: Cardio/strength training for 30 minutes 4 times per week. Anti-obesity medications: Ozempic 1 mg subcutaneously weekly. Reported side effects: None.  Assessment/Plan:   1. Vitamin D deficiency Not at goal. She is taking vitamin D 50,000 IU weekly.  Plan: Continue to take prescription Vitamin D @50 ,000 IU every week as prescribed.  Will check her vitamin D level today.  Lab Results  Component Value Date   VD25OH 23.21 (L) 03/27/2021   VD25OH 27.4 (L) 12/09/2020   - VITAMIN D 25 Hydroxy (Vit-D Deficiency, Fractures)  2. Prediabetes Not at goal. Goal is HgbA1c < 5.7.  Medication: Ozempic 1 mg subcutaneously weekly.    Plan:  Continue Ozempic 1 mg subcutaneously weekly.  She will continue to focus on protein-rich, low simple carbohydrate foods. We reviewed the importance of hydration, regular exercise for stress reduction, and restorative sleep.  Will check labs today.  Lab Results  Component Value Date   HGBA1C 6.0 (H) 03/27/2021   Lab Results  Component Value Date   INSULIN 13.2 08/07/2019   - Comprehensive metabolic panel - Hemoglobin A1c  3. Anemia [name] is not currently taking an iron supplement.  Nutrition: Iron-rich foods include dark leafy greens, red and white meats, eggs, seafood, and beans.  Certain foods and drinks prevent your body from absorbing iron properly.  Avoid eating these foods in the same meal as iron-rich foods or with iron supplements. These foods include: coffee, black tea, and red wine; milk, dairy products, and foods that are high in calcium; beans and soybeans; whole grains. Constipation can be a side effect of iron supplementation. Increased water and fiber intake are helpful. Water goal: > 2 liters/day. Fiber goal: > 25 grams/day.  Plan:  Will check anemia panel  and CBC today.  CBC Latest Ref Rng & Units 03/27/2021 08/18/2020  WBC 3.4 - 10.8 x10E3/uL 9.8 CANCELED  Hemoglobin 11.1 - 15.9 g/dL 11.9(L) -  Hematocrit 34.0 - 46.6 % 37.0 CANCELED  Platelets 150 - 450 x10E3/uL 299.0 -   Lab Results  Component Value Date   IRON 33 08/18/2020   TIBC 310 08/18/2020   FERRITIN 37 08/18/2020   Lab Results  Component Value Date   VITAMINB12 573 08/18/2020   - Anemia panel - CBC with Differential/Platelet  4. Obesity, current BMI 42.3  Course: Jennah is currently in the action stage of change. As such, her goal is to continue with weight loss efforts.   Nutrition goals: She has agreed to practicing portion control and making smarter food choices, such as increasing vegetables and decreasing simple carbohydrates.   Exercise goals:  As is.  Behavioral modification strategies: increasing lean protein intake, decreasing simple carbohydrates, increasing vegetables, and increasing water intake.  Takela has agreed to follow-up with our clinic in 4 weeks. She was informed of the importance of frequent follow-up visits to maximize her success with intensive lifestyle modifications for her multiple health conditions.   Objective:  Blood pressure 120/85, pulse 85, temperature 97.6 F (36.4 C), temperature source Oral, height 5\' 5"  (1.651 m), weight 254 lb (115.2 kg), SpO2 97 %. Body mass index is 42.27 kg/m.  General: Cooperative, alert, well developed, in no acute distress. HEENT: Conjunctivae and lids unremarkable. Cardiovascular:  Regular rhythm.  Lungs: Normal work of breathing. Neurologic: No focal deficits.   Lab Results  Component Value Date   HGBA1C 6.0 03/27/2021   HGBA1C 5.8 (H) 12/09/2020   HGBA1C 5.6 08/18/2020   HGBA1C 5.8 (H) 02/27/2020   Lab Results  Component Value Date   INSULIN 13.2 08/07/2019   Lab Results  Component Value Date   TSH 1.05 03/27/2021   Lab Results  Component Value Date   CHOL 164 08/18/2020   HDL 55 08/18/2020   LDLCALC 95 08/18/2020   TRIG 71 08/18/2020   CHOLHDL 3.0 08/18/2020   Lab Results  Component Value Date   VD25OH 23.21 (L) 03/27/2021   VD25OH 27.4 (L) 12/09/2020   Attestation Statements:   Reviewed by clinician on day of visit: allergies, medications, problem list, medical history, surgical history, family history, social history, and previous encounter notes.  I, 02/09/2021, CMA, am acting as transcriptionist for Insurance claims handler, DO  I have reviewed the above documentation for accuracy and completeness, and I agree with the above. -  Helane Rima, DO, MS, FAAFP, DABOM - Family and Bariatric Medicine.

## 2021-08-25 ENCOUNTER — Encounter (INDEPENDENT_AMBULATORY_CARE_PROVIDER_SITE_OTHER): Payer: Self-pay | Admitting: Family Medicine

## 2021-08-25 ENCOUNTER — Other Ambulatory Visit: Payer: Self-pay

## 2021-08-25 ENCOUNTER — Ambulatory Visit (INDEPENDENT_AMBULATORY_CARE_PROVIDER_SITE_OTHER): Payer: BC Managed Care – PPO | Admitting: Family Medicine

## 2021-08-25 VITALS — BP 120/89 | HR 93 | Temp 97.5°F | Ht 65.0 in | Wt 251.0 lb

## 2021-08-25 DIAGNOSIS — R7301 Impaired fasting glucose: Secondary | ICD-10-CM

## 2021-08-25 DIAGNOSIS — E559 Vitamin D deficiency, unspecified: Secondary | ICD-10-CM | POA: Diagnosis not present

## 2021-08-25 DIAGNOSIS — E669 Obesity, unspecified: Secondary | ICD-10-CM

## 2021-08-25 DIAGNOSIS — Z6841 Body Mass Index (BMI) 40.0 and over, adult: Secondary | ICD-10-CM

## 2021-08-25 DIAGNOSIS — R7303 Prediabetes: Secondary | ICD-10-CM

## 2021-08-25 DIAGNOSIS — F5081 Binge eating disorder: Secondary | ICD-10-CM

## 2021-08-25 MED ORDER — VITAMIN D (ERGOCALCIFEROL) 1.25 MG (50000 UNIT) PO CAPS: 50000.0000 [IU] | ORAL_CAPSULE | ORAL | 0 refills | Status: DC

## 2021-08-25 MED ORDER — OZEMPIC (1 MG/DOSE) 4 MG/3ML ~~LOC~~ SOPN: 1.0000 mg | PEN_INJECTOR | SUBCUTANEOUS | 2 refills | Status: DC

## 2021-08-25 MED ORDER — VYVANSE 60 MG PO CHEW: 60.0000 mg | CHEWABLE_TABLET | Freq: Every morning | ORAL | 0 refills | Status: DC

## 2021-09-03 NOTE — Progress Notes (Signed)
Chief Complaint:   OBESITY Lauren Powell is here to discuss her progress with her obesity treatment plan along with follow-up of her obesity related diagnoses. See Medical Weight Management Flowsheet for complete bioelectrical impedance results.  Today's visit was #: 35 Starting weight: 276 lbs Starting date: 08/07/2019 Weight change since last visit: 3 lbs Total lbs lost to date: 25 lbs Total weight loss percentage to date: -9.06%  Nutrition Plan: Practicing portion control and making smarter food choices, such as increasing vegetables and decreasing simple carbohydrates for 80% of the time. Activity: Walking for 30 minutes 80% of the day. Anti-obesity medications: Ozempic 1 mg subcutaneously weekly. Reported side effects: None.  Interim History: Lauren Powell says she needs Vyvanse and Ozempic refills.  She is happy with her progress.  Assessment/Plan:   1. Vitamin D deficiency At goal. She is taking vitamin D 50,000 IU weekly.  Plan: Continue to take prescription Vitamin D @50 ,000 IU every week as prescribed.  Follow-up for routine testing of Vitamin D, at least 2-3 times per year to avoid over-replacement.  Lab Results  Component Value Date   VD25OH 24.1 (L) 08/11/2021   VD25OH 23.21 (L) 03/27/2021   VD25OH 27.4 (L) 12/09/2020   - Refill Vitamin D, Ergocalciferol, (DRISDOL) 1.25 MG (50000 UNIT) CAPS capsule; Take 1 capsule (50,000 Units total) by mouth 2 (two) times a week.  Dispense: 8 capsule; Refill: 0  2. Prediabetes At goal. Goal is HgbA1c < 5.7.  Medication: Ozempic 1 mg subcutaneously weekly.  A1c improving from 6.0-5.7.  Plan: She will continue to focus on protein-rich, low simple carbohydrate foods. We reviewed the importance of hydration, regular exercise for stress reduction, and restorative sleep.   Lab Results  Component Value Date   HGBA1C 5.7 (H) 08/11/2021   Lab Results  Component Value Date   INSULIN 13.2 08/07/2019   3. Impaired fasting glucose, with  polyphagia Controlled. Current treatment: Ozempic 1 mg subcutaneously weekly.    Plan: Continue Ozempic 1 mg subcutaneously weekly.  She will continue to focus on protein-rich, low simple carbohydrate foods. We reviewed the importance of hydration, regular exercise for stress reduction, and restorative sleep.  - Refill Semaglutide, 1 MG/DOSE, (OZEMPIC, 1 MG/DOSE,) 4 MG/3ML SOPN; Inject 1 mg into the skin once a week.  Dispense: 3 mL; Refill: 2  4. Binge eating disorder Lauren Powell is taking Vyvanse 60 mg daily for BED.  She will continue Vyvanse at this dose.  Refill sent to pharmacy today.   The goals for treatment of BED are to reduce eating binges and to achieve healthy eating habits. Because binge eating can correlate with negative emotions, treatment may also address any other mental-health issues, such as depression.  People who binge eat feel as if they don't have control over how much they eat and have feelings of guilt or self-loathing after a binge eating episode.  The FDA has approved Vyvanse as a treatment option for binge eating disorder. Vyvanse targets the brain's reward center by increasing the levels of dopamine and norepinephrine, the chemicals of the brain responsible for feelings of pleasure.  Mindful eating is the recommended nutritional approach to treating BED.   - Refill Lisdexamfetamine Dimesylate (VYVANSE) 60 MG CHEW; Chew 60 mg by mouth every morning.  Dispense: 30 tablet; Refill: 0  5. Obesity, current BMI 41.8  Course: Lauren Powell is currently in the action stage of change. As such, her goal is to continue with weight loss efforts.   Nutrition goals: She has agreed to  practicing portion control and making smarter food choices, such as increasing vegetables and decreasing simple carbohydrates.   Exercise goals:  As is.  Behavioral modification strategies: increasing lean protein intake, decreasing simple carbohydrates, and increasing vegetables.  Lauren Powell has agreed to  follow-up with our clinic in 2 weeks. She was informed of the importance of frequent follow-up visits to maximize her success with intensive lifestyle modifications for her multiple health conditions.   Objective:   Blood pressure 120/89, pulse 93, temperature (!) 97.5 F (36.4 C), temperature source Oral, height 5\' 5"  (1.651 m), weight 251 lb (113.9 kg), SpO2 100 %. Body mass index is 41.77 kg/m.  General: Cooperative, alert, well developed, in no acute distress. HEENT: Conjunctivae and lids unremarkable. Cardiovascular: Regular rhythm.  Lungs: Normal work of breathing. Neurologic: No focal deficits.   Lab Results  Component Value Date   CREATININE 0.80 08/11/2021   BUN 6 08/11/2021   NA 140 08/11/2021   K 3.6 08/11/2021   CL 101 08/11/2021   CO2 24 08/11/2021   Lab Results  Component Value Date   ALT 10 08/11/2021   AST 12 08/11/2021   ALKPHOS 81 08/11/2021   BILITOT 0.6 08/11/2021   Lab Results  Component Value Date   HGBA1C 5.7 (H) 08/11/2021   HGBA1C 6.0 03/27/2021   HGBA1C 5.8 (H) 12/09/2020   HGBA1C 5.6 08/18/2020   HGBA1C 5.8 (H) 02/27/2020   Lab Results  Component Value Date   INSULIN 13.2 08/07/2019   Lab Results  Component Value Date   TSH 1.05 03/27/2021   Lab Results  Component Value Date   CHOL 164 08/18/2020   HDL 55 08/18/2020   LDLCALC 95 08/18/2020   TRIG 71 08/18/2020   CHOLHDL 3.0 08/18/2020   Lab Results  Component Value Date   VD25OH 24.1 (L) 08/11/2021   VD25OH 23.21 (L) 03/27/2021   VD25OH 27.4 (L) 12/09/2020   Lab Results  Component Value Date   WBC 7.4 08/11/2021   HGB 13.2 08/11/2021   HCT 41.2 08/11/2021   MCV 79 08/11/2021   PLT 358 08/11/2021   Lab Results  Component Value Date   IRON 37 08/11/2021   TIBC 306 08/11/2021   FERRITIN 39 08/11/2021   Attestation Statements:   Reviewed by clinician on day of visit: allergies, medications, problem list, medical history, surgical history, family history, social  history, and previous encounter notes.  I, 10/11/2021, CMA, am acting as transcriptionist for Insurance claims handler, DO  I have reviewed the above documentation for accuracy and completeness, and I agree with the above. -  Helane Rima, DO, MS, FAAFP, DABOM - Family and Bariatric Medicine.

## 2021-09-07 ENCOUNTER — Ambulatory Visit (INDEPENDENT_AMBULATORY_CARE_PROVIDER_SITE_OTHER): Payer: BC Managed Care – PPO | Admitting: Family Medicine

## 2021-09-08 ENCOUNTER — Encounter (INDEPENDENT_AMBULATORY_CARE_PROVIDER_SITE_OTHER): Payer: Self-pay | Admitting: Family Medicine

## 2021-09-08 ENCOUNTER — Ambulatory Visit (INDEPENDENT_AMBULATORY_CARE_PROVIDER_SITE_OTHER): Payer: BC Managed Care – PPO | Admitting: Family Medicine

## 2021-09-08 VITALS — BP 114/88 | HR 95 | Temp 97.8°F | Ht 65.0 in | Wt 247.0 lb

## 2021-09-08 DIAGNOSIS — R7303 Prediabetes: Secondary | ICD-10-CM | POA: Diagnosis not present

## 2021-09-08 DIAGNOSIS — E559 Vitamin D deficiency, unspecified: Secondary | ICD-10-CM

## 2021-09-08 DIAGNOSIS — F5081 Binge eating disorder: Secondary | ICD-10-CM

## 2021-09-08 DIAGNOSIS — E669 Obesity, unspecified: Secondary | ICD-10-CM

## 2021-09-08 DIAGNOSIS — Z6841 Body Mass Index (BMI) 40.0 and over, adult: Secondary | ICD-10-CM

## 2021-09-08 MED ORDER — OZEMPIC (1 MG/DOSE) 4 MG/3ML ~~LOC~~ SOPN: 1.0000 mg | PEN_INJECTOR | SUBCUTANEOUS | 3 refills | Status: DC

## 2021-09-08 NOTE — Progress Notes (Signed)
Chief Complaint:   OBESITY Lauren Powell is here to discuss her progress with her obesity treatment plan along with follow-up of her obesity related diagnoses. See Medical Weight Management Flowsheet for complete bioelectrical impedance results.  Today's visit was #: 36 Starting weight: 276 lbs Starting date: 08/07/2019 Weight change since last visit: 4 lbs Total lbs lost to date: 29 lbs Total weight loss percentage to date: -10.51%  Nutrition Plan: Practicing portion control and making smarter food choices, such as increasing vegetables and decreasing simple carbohydrates for 80% of the time. Activity: Walking for 30-35 minutes 4 times per week. Anti-obesity medications: Ozempic 1 mg subcutaneously weekly. Reported side effects: None.  Interim History: Lauren Powell is up 3 lbs of muscle and down 8 lbs of fat.  Her next goal is 231 lbs, prepregnancy 201, then 199 is ultimate goal.  Assessment/Plan:   1. Vitamin D deficiency Not at goal. She is taking vitamin D 50,000 IU weekly.  Plan: Continue to take prescription Vitamin D @50 ,000 IU every week as prescribed.  Follow-up for routine testing of Vitamin D, at least 2-3 times per year to avoid over-replacement.  Lab Results  Component Value Date   VD25OH 24.1 (L) 08/11/2021   VD25OH 23.21 (L) 03/27/2021   VD25OH 27.4 (L) 12/09/2020   2. Prediabetes Improving.  A1c went from 6.0 to 5.7. Goal is HgbA1c < 5.7.  Medication: Ozempic 1 mg subcutaneously weekly.    Plan: Continue Ozempic 1 mg subcutaneously weekly.  Will refill today, as per below.  She will continue to focus on protein-rich, low simple carbohydrate foods. We reviewed the importance of hydration, regular exercise for stress reduction, and restorative sleep.   Lab Results  Component Value Date   HGBA1C 5.7 (H) 08/11/2021   Lab Results  Component Value Date   INSULIN 13.2 08/07/2019   - Refill Semaglutide, 1 MG/DOSE, (OZEMPIC, 1 MG/DOSE,) 4 MG/3ML SOPN; Inject 1 mg into the  skin once a week.  Dispense: 9 mL; Refill: 3  3. Binge eating disorder Lauren Powell is taking Vyvanse 60 mg daily for BED.  She will continue Vyvanse at this dose.    The goals for treatment of BED are to reduce eating binges and to achieve healthy eating habits. Because binge eating can correlate with negative emotions, treatment may also address any other mental-health issues, such as depression.  People who binge eat feel as if they don't have control over how much they eat and have feelings of guilt or self-loathing after a binge eating episode.  The FDA has approved Vyvanse as a treatment option for binge eating disorder. Vyvanse targets the brain's reward center by increasing the levels of dopamine and norepinephrine, the chemicals of the brain responsible for feelings of pleasure.  Mindful eating is the recommended nutritional approach to treating BED.   4. Obesity, current BMI 41.1  Course: Lauren Powell is currently in the action stage of change. As such, her goal is to continue with weight loss efforts.   Nutrition goals: She has agreed to practicing portion control and making smarter food choices, such as increasing vegetables and decreasing simple carbohydrates.   Exercise goals:  As is.  Behavioral modification strategies: increasing lean protein intake, decreasing simple carbohydrates, and increasing vegetables.  Lauren Powell has agreed to follow-up with our clinic in 6 weeks. She was informed of the importance of frequent follow-up visits to maximize her success with intensive lifestyle modifications for her multiple health conditions.   Objective:   General: Cooperative, alert,  well developed, in no acute distress. HEENT: Conjunctivae and lids unremarkable. Cardiovascular: Regular rhythm.  Lungs: Normal work of breathing. Neurologic: No focal deficits.   Lab Results  Component Value Date   CREATININE 0.80 08/11/2021   BUN 6 08/11/2021   NA 140 08/11/2021   K 3.6 08/11/2021   CL 101  08/11/2021   CO2 24 08/11/2021   Lab Results  Component Value Date   ALT 10 08/11/2021   AST 12 08/11/2021   ALKPHOS 81 08/11/2021   BILITOT 0.6 08/11/2021   Lab Results  Component Value Date   HGBA1C 5.7 (H) 08/11/2021   HGBA1C 6.0 03/27/2021   HGBA1C 5.8 (H) 12/09/2020   HGBA1C 5.6 08/18/2020   HGBA1C 5.8 (H) 02/27/2020   Lab Results  Component Value Date   INSULIN 13.2 08/07/2019   Lab Results  Component Value Date   TSH 1.05 03/27/2021   Lab Results  Component Value Date   CHOL 164 08/18/2020   HDL 55 08/18/2020   LDLCALC 95 08/18/2020   TRIG 71 08/18/2020   CHOLHDL 3.0 08/18/2020   Lab Results  Component Value Date   VD25OH 24.1 (L) 08/11/2021   VD25OH 23.21 (L) 03/27/2021   VD25OH 27.4 (L) 12/09/2020   Lab Results  Component Value Date   WBC 7.4 08/11/2021   HGB 13.2 08/11/2021   HCT 41.2 08/11/2021   MCV 79 08/11/2021   PLT 358 08/11/2021   Lab Results  Component Value Date   IRON 37 08/11/2021   TIBC 306 08/11/2021   FERRITIN 39 08/11/2021   Attestation Statements:   Reviewed by clinician on day of visit: allergies, medications, problem list, medical history, surgical history, family history, social history, and previous encounter notes.  I, Insurance claims handler, CMA, am acting as transcriptionist for Helane Rima, DO  I have reviewed the above documentation for accuracy and completeness, and I agree with the above. -  Helane Rima, DO, MS, FAAFP, DABOM - Family and Bariatric Medicine.

## 2021-12-23 ENCOUNTER — Encounter: Payer: Self-pay | Admitting: Adult Health

## 2021-12-23 ENCOUNTER — Ambulatory Visit (INDEPENDENT_AMBULATORY_CARE_PROVIDER_SITE_OTHER): Payer: BC Managed Care – PPO | Admitting: Adult Health

## 2021-12-23 VITALS — BP 122/80 | HR 65 | Temp 98.6°F | Ht 65.0 in | Wt 252.0 lb

## 2021-12-23 DIAGNOSIS — M722 Plantar fascial fibromatosis: Secondary | ICD-10-CM | POA: Diagnosis not present

## 2021-12-23 DIAGNOSIS — B379 Candidiasis, unspecified: Secondary | ICD-10-CM | POA: Diagnosis not present

## 2021-12-23 MED ORDER — CLOTRIMAZOLE-BETAMETHASONE 1-0.05 % EX CREA: 1.0000 | TOPICAL_CREAM | Freq: Two times a day (BID) | CUTANEOUS | 1 refills | Status: DC

## 2021-12-23 NOTE — Progress Notes (Signed)
Subjective:    Patient ID: Lauren Powell, female    DOB: 1986/04/13, 36 y.o.   MRN: [id number]  HPI  36 year old female who  has a past medical history of Allergy, Eczema, Female infertility, History of anemia, Obesity, and Vitamin D deficiency.  She presents to the office today for two separate issues.   About 3-4 days she developed a red, itchy, burning rash under her left breast. She has had this rash in the past. She has not used anything OTC for it. Rash seems to be improving but still very itchy.  She has had on again and off again severe pain in the bottom of her left foot. She has a history of planter fascitis in her left foot and the symptoms feel the same. She does not want to do a steroid injection if she can help it. Pain is worse with walking. He ha been wearing a foot brace at night      Review of Systems See HPI   Past Medical History:  Diagnosis Date   Allergy    Eczema    Female infertility    History of anemia    Obesity    Vitamin D deficiency     Social History   Socioeconomic History   Marital status: Divorced    Spouse name: Not on file   Number of children: Not on file   Years of education: Not on file   Highest education level: Not on file  Occupational History   Occupation: Midwife  Tobacco Use   Smoking status: Never   Smokeless tobacco: Never  Vaping Use   Vaping Use: Never used  Substance and Sexual Activity   Alcohol use: No   Drug use: No   Sexual activity: Yes    Partners: Male    Birth control/protection: None  Other Topics Concern   Not on file  Social History Narrative   Patient lives with son   Teaches pre school and kindergarten.    Social Determinants of Health   Financial Resource Strain: Not on file  Food Insecurity: Not on file  Transportation Needs: Not on file  Physical Activity: Not on file  Stress: Not on file  Social Connections: Not on file  Intimate Partner Violence: Not on file     Past Surgical History:  Procedure Laterality Date   CESAREAN SECTION     DILATATION & CURETTAGE/HYSTEROSCOPY WITH MYOSURE N/A 08/06/2019   Procedure: DILATATION & CURETTAGE/HYSTEROSCOPY WITH MYOSURE;  Surgeon: Maxie Better, MD;  Location: [facility];  Service: Gynecology;  Laterality: N/A;   TONSILLECTOMY  04/30/2019   TOOTH EXTRACTION N/A     Family History  Problem Relation Age of Onset   Alcohol abuse Mother    Drug abuse Mother    Bipolar disorder Mother    Depression Mother    Hypertension Paternal Aunt    Hypertension Paternal Grandmother    Prostate cancer Paternal Grandfather     No Known Allergies  Current Outpatient Medications on File Prior to Visit  Medication Sig Dispense Refill   azelastine (ASTELIN) 0.1 % nasal spray Place 2 sprays into both nostrils 2 (two) times daily. Use in each nostril as directed 30 mL 3   Lisdexamfetamine Dimesylate (VYVANSE) 60 MG CHEW Chew 60 mg by mouth every morning. 30 tablet 0   Olopatadine HCl 0.2 % SOLN INSTILL 1 DROP INTO AFFECTED EYE EVERY DAY     Semaglutide, 1 MG/DOSE, (OZEMPIC, 1 MG/DOSE,)  4 MG/3ML SOPN Inject 1 mg into the skin once a week. 9 mL 3   triamcinolone (KENALOG) 0.1 % Apply 1 application topically 2 (two) times daily. 30 g 1   Vitamin D, Ergocalciferol, (DRISDOL) 1.25 MG (50000 UNIT) CAPS capsule Take 1 capsule (50,000 Units total) by mouth 2 (two) times a week. 8 capsule 0   No current facility-administered medications on file prior to visit.    BP 122/80   Pulse 65   Temp 98.6 F (37 C) (Oral)   Ht 5\' 5"  (1.651 m)   Wt 252 lb (114.3 kg)   LMP  (LMP Unknown)   SpO2 100%   BMI 41.93 kg/m       Objective:   Physical Exam Vitals and nursing note reviewed.  Constitutional:      Appearance: Normal appearance.  Musculoskeletal:     Comments: Discomfort with palpation along PF on bottom of right foot.   Skin:    General: Skin is warm and dry.     Capillary Refill: Capillary  refill takes less than 2 seconds.     Findings: Erythema and rash present.     Comments: Discolored flat shiny rash noted under left breast. No skin breakdown.   Neurological:     General: No focal deficit present.     Mental Status: She is alert and oriented to person, place, and time.  Psychiatric:        Mood and Affect: Mood normal.        Behavior: Behavior normal.        Thought Content: Thought content normal.        Judgment: Judgment normal.       Assessment & Plan:  1. Candida infection  - clotrimazole-betamethasone (LOTRISONE) cream; Apply 1 Application topically 2 (two) times daily.  Dispense: 30 g; Refill: 1 - Follow up if not resolved in the next 14 days 2. Plantar fasciitis, right - Advised conservative measure such as NSAIDS, stretching, and rolling a tennis ball or frozen water bottle on the bottom of the foot - Follow up if not improved in the next 2-3 week  , NP

## 2022-01-13 ENCOUNTER — Encounter (INDEPENDENT_AMBULATORY_CARE_PROVIDER_SITE_OTHER): Payer: Self-pay

## 2022-01-24 ENCOUNTER — Emergency Department (HOSPITAL_COMMUNITY)
Admission: EM | Admit: 2022-01-24 | Discharge: 2022-01-24 | Disposition: A | Discharge status: Home / Self Care | Payer: BC Managed Care – PPO | Attending: Emergency Medicine | Admitting: Emergency Medicine

## 2022-01-24 ENCOUNTER — Encounter (HOSPITAL_COMMUNITY): Payer: Self-pay | Admitting: Emergency Medicine

## 2022-01-24 ENCOUNTER — Emergency Department (HOSPITAL_COMMUNITY): Payer: BC Managed Care – PPO

## 2022-01-24 DIAGNOSIS — X501XXA Overexertion from prolonged static or awkward postures, initial encounter: Secondary | ICD-10-CM | POA: Insufficient documentation

## 2022-01-24 DIAGNOSIS — S63690A Other sprain of right index finger, initial encounter: Secondary | ICD-10-CM

## 2022-01-24 DIAGNOSIS — S63610A Unspecified sprain of right index finger, initial encounter: Secondary | ICD-10-CM | POA: Insufficient documentation

## 2022-01-24 DIAGNOSIS — S65500A Unspecified injury of blood vessel of right index finger, initial encounter: Secondary | ICD-10-CM | POA: Diagnosis present

## 2022-01-24 MED ORDER — IBUPROFEN 600 MG PO TABS: 600.0000 mg | ORAL_TABLET | Freq: Four times a day (QID) | ORAL | 0 refills | Status: AC | PRN

## 2022-01-24 NOTE — ED Provider Notes (Signed)
[facility]-EMERGENCY DEPT Provider Note   CSN: [id number] Arrival date & time: 01/24/22  1728     History  Chief Complaint  Patient presents with   Hand Pain    Ane CONTINA STRAIN is a 36 y.o. female.  The history is provided by the patient. No language interpreter was used.  Hand Pain     36 year old female presenting complaining of finger injury.  Patient reports yesterday morning she was folding clothes when she excellently twisted her right index finger while reaching into a tote bag.  Since then she has had throbbing pain about the base of her right index finger extending towards her.  Pain worse with movement, moderate in intensity without any numbness.  No specific treatment tried.  Home Medications Prior to Admission medications   Medication Sig Start Date End Date Taking? Authorizing Provider  azelastine (ASTELIN) 0.1 % nasal spray Place 2 sprays into both nostrils 2 (two) times daily. Use in each nostril as directed 09/25/19   Nafziger, Kandee Keen, NP  clotrimazole-betamethasone (LOTRISONE) cream Apply 1 Application topically 2 (two) times daily. 12/23/21   Nafziger, Kandee Keen, NP  Lisdexamfetamine Dimesylate (VYVANSE) 60 MG CHEW Chew 60 mg by mouth every morning. 08/25/21   Helane Rima, DO  Olopatadine HCl 0.2 % SOLN INSTILL 1 DROP INTO AFFECTED EYE EVERY DAY 03/17/19   [provider]  Semaglutide, 1 MG/DOSE, (OZEMPIC, 1 MG/DOSE,) 4 MG/3ML SOPN Inject 1 mg into the skin once a week. 09/08/21   Helane Rima, DO  triamcinolone (KENALOG) 0.1 % Apply 1 application topically 2 (two) times daily. 08/19/20   Nafziger, Kandee Keen, NP  Vitamin D, Ergocalciferol, (DRISDOL) 1.25 MG (50000 UNIT) CAPS capsule Take 1 capsule (50,000 Units total) by mouth 2 (two) times a week. 08/27/21   Helane Rima, DO      Allergies    Patient has no known allergies.    Review of Systems   Review of Systems  All other systems reviewed and are negative.   Physical  Exam Updated Vital Signs BP (!) 121/97   Pulse 95   Temp 98.9 F (37.2 C) (Oral)   Resp 15   Ht 5\' 5"  (1.651 m)   Wt 114 kg   LMP 01/05/2022   SpO2 100%   BMI 41.82 kg/m  Physical Exam Vitals and nursing note reviewed.  Constitutional:      General: She is not in acute distress.    Appearance: She is well-developed.  HENT:     Head: Atraumatic.  Eyes:     Conjunctiva/sclera: Conjunctivae normal.  Pulmonary:     Effort: Pulmonary effort is normal.  Musculoskeletal:        General: Tenderness (Right hand: Tenderness noted to the second MCP without any deformity.  Finger with full range of motion, normal grip strength.) present.     Cervical back: Neck supple.  Skin:    Findings: No rash.  Neurological:     Mental Status: She is alert.  Psychiatric:        Mood and Affect: Mood normal.     ED Results / Procedures / Treatments   Labs (all labs ordered are listed, but only abnormal results are displayed) Labs Reviewed - No data to display  EKG None  Radiology DG Finger Index Right  Result Date: 01/24/2022 CLINICAL DATA:  Twisting injury EXAM: RIGHT INDEX FINGER 2+V COMPARISON:  None Available. FINDINGS: No acute fracture or dislocation. No significant soft tissue swelling. IMPRESSION: No acute osseous abnormality. Electronically  Signed   By: Jeronimo Greaves M.D.   On: 01/24/2022 18:13    Procedures Procedures    Medications Ordered in ED Medications - No data to display  ED Course/ Medical Decision Making/ A&P                           Medical Decision Making Amount and/or Complexity of Data Reviewed Radiology: ordered.   BP (!) 121/97   Pulse 95   Temp 98.9 F (37.2 C) (Oral)   Resp 15   Ht 5\' 5"  (1.651 m)   Wt 114 kg   LMP 01/05/2022   SpO2 100%   BMI 41.82 kg/m   7:16 PM Patient injured her right index finger yesterday when she accidentally twisted while reaching down a tote bag.  She endorsed pain to the base of her index finger worsened with  movement.  On exam she does have some tenderness noted to the second MCP without any obvious deformity.  Finger with full range of motion, sensation is intact distally.  Normal grip strength.  X-ray obtained independently reviewed and interpreted by me and shows no acute osseous abnormalities about the right index finger.  Injury is likely a finger sprain/strain.  RICE therapy discussed but otherwise patient is stable for discharge.        Final Clinical Impression(s) / ED Diagnoses Final diagnoses:  Other sprain of right index finger, initial encounter    Rx / DC Orders ED Discharge Orders          Ordered    ibuprofen (ADVIL) 600 MG tablet  Every 6 hours PRN        01/24/22 1917              01/26/22, PA-C 01/24/22 1917    01/26/22, MD 01/24/22 2242

## 2022-01-24 NOTE — ED Triage Notes (Signed)
Pt reports Saturday morning she twisted her pointer finger on a tote bag. Pain has moved onto top of hand.

## 2022-01-24 NOTE — ED Notes (Signed)
Pt requested finger splint

## 2022-02-23 ENCOUNTER — Ambulatory Visit: Payer: BC Managed Care – PPO | Admitting: Adult Health

## 2022-02-23 ENCOUNTER — Encounter: Payer: Self-pay | Admitting: Adult Health

## 2022-02-23 VITALS — BP 122/84 | HR 89 | Temp 98.4°F | Ht 65.0 in | Wt 240.0 lb

## 2022-02-23 DIAGNOSIS — S63690S Other sprain of right index finger, sequela: Secondary | ICD-10-CM | POA: Diagnosis not present

## 2022-02-23 MED ORDER — TRAMADOL HCL 50 MG PO TABS: 50.0000 mg | ORAL_TABLET | Freq: Two times a day (BID) | ORAL | 0 refills | Status: AC | PRN

## 2022-02-23 NOTE — Progress Notes (Signed)
Subjective:    Patient ID: Lauren Powell, female    DOB: February 20, 1986, 36 y.o.   MRN: [id number]  HPI 36 year old female who  has a past medical history of Allergy, Eczema, Female infertility, History of anemia, Obesity, and Vitamin D deficiency.  She presents to the office today for follow up. She was originally seen in the ER a month ago and was diagnosed with a strain of her right index finger. She reports that she was at work over the summer and was grabbing a box off a shelf and twister her hand/wrist Her xray did not show any acute fractures. She was prescribed 600 mg Ibuprofen. She reports that she continues to have pain especially at night.   Has full ROM    Review of Systems See HPI   Past Medical History:  Diagnosis Date   Allergy    Eczema    Female infertility    History of anemia    Obesity    Vitamin D deficiency     Social History   Socioeconomic History   Marital status: Divorced    Spouse name: Not on file   Number of children: Not on file   Years of education: Not on file   Highest education level: Not on file  Occupational History   Occupation: Oncologist  Tobacco Use   Smoking status: Never   Smokeless tobacco: Never  Vaping Use   Vaping Use: Never used  Substance and Sexual Activity   Alcohol use: No   Drug use: No   Sexual activity: Yes    Partners: Male    Birth control/protection: None  Other Topics Concern   Not on file  Social History Narrative   Patient lives with son   Teaches pre school and kindergarten.    Social Determinants of Health   Financial Resource Strain: Not on file  Food Insecurity: Not on file  Transportation Needs: Not on file  Physical Activity: Not on file  Stress: Not on file  Social Connections: Not on file  Intimate Partner Violence: Not on file    Past Surgical History:  Procedure Laterality Date   CESAREAN SECTION     [place] N/A 08/06/2019    Procedure: [place];  Surgeon: Servando Salina, MD;  Location: [facility];  Service: Gynecology;  Laterality: N/A;   TONSILLECTOMY  04/30/2019   TOOTH EXTRACTION N/A     Family History  Problem Relation Age of Onset   Alcohol abuse Mother    Drug abuse Mother    Bipolar disorder Mother    Depression Mother    Hypertension Paternal Aunt    Hypertension Paternal Grandmother    Prostate cancer Paternal Grandfather     No Known Allergies  Current Outpatient Medications on File Prior to Visit  Medication Sig Dispense Refill   azelastine (ASTELIN) 0.1 % nasal spray Place 2 sprays into both nostrils 2 (two) times daily. Use in each nostril as directed 30 mL 3   clotrimazole-betamethasone (LOTRISONE) cream Apply 1 Application topically 2 (two) times daily. 30 g 1   ibuprofen (ADVIL) 600 MG tablet Take 1 tablet (600 mg total) by mouth every 6 (six) hours as needed. 30 tablet 0   Lisdexamfetamine Dimesylate (VYVANSE) 60 MG CHEW Chew 60 mg by mouth every morning. 30 tablet 0   Olopatadine HCl 0.2 % SOLN INSTILL 1 DROP INTO AFFECTED EYE EVERY DAY     Semaglutide,  1 MG/DOSE, (OZEMPIC, 1 MG/DOSE,) 4 MG/3ML SOPN Inject 1 mg into the skin once a week. 9 mL 3   triamcinolone (KENALOG) 0.1 % Apply 1 application topically 2 (two) times daily. 30 g 1   Vitamin D, Ergocalciferol, (DRISDOL) 1.25 MG (50000 UNIT) CAPS capsule Take 1 capsule (50,000 Units total) by mouth 2 (two) times a week. 8 capsule 0   No current facility-administered medications on file prior to visit.    BP 122/84   Pulse 89   Temp 98.4 F (36.9 C) (Oral)   Ht 5\' 5"  (1.651 m)   Wt 240 lb (108.9 kg)   LMP 01/05/2022   SpO2 100%   BMI 39.94 kg/m       Objective:   Physical Exam Vitals and nursing note reviewed.  Constitutional:      Appearance: Normal appearance.  Musculoskeletal:     Comments: Has mild tenderness with palpation to second MCP joint of right  index finger. Trace swelling noted   Skin:    General: Skin is warm and dry.     Capillary Refill: Capillary refill takes less than 2 seconds.  Neurological:     General: No focal deficit present.     Mental Status: She is alert and oriented to person, place, and time.  Psychiatric:        Mood and Affect: Mood normal.        Behavior: Behavior normal.        Thought Content: Thought content normal.        Judgment: Judgment normal.        Assessment & Plan:   1. Other sprain of right index finger, sequela - likely ligament or tendon strain and she was advised that this can take multiple months to heal completely. She can continue to use NSAids as needed, ice, heat, and buddy tape. Will give a short course of Tramadol to use at night PRN - traMADol (ULTRAM) 50 MG tablet; Take 1 tablet (50 mg total) by mouth 3 times/day as needed-between meals & bedtime for up to 5 days.  Dispense: 15 tablet; Refill: 0  03/07/2022, NP  Time spent with patient today was 31 minutes which consisted of chart review, discussing diagnosis, work up, treatment answering questions and documentation.

## 2022-03-31 ENCOUNTER — Other Ambulatory Visit: Payer: Self-pay

## 2022-03-31 ENCOUNTER — Encounter: Payer: Self-pay | Admitting: Adult Health

## 2022-03-31 ENCOUNTER — Ambulatory Visit (INDEPENDENT_AMBULATORY_CARE_PROVIDER_SITE_OTHER): Payer: BC Managed Care – PPO | Admitting: Adult Health

## 2022-03-31 VITALS — BP 120/90 | HR 94 | Temp 98.1°F | Ht 65.0 in | Wt 232.0 lb

## 2022-03-31 DIAGNOSIS — Z1159 Encounter for screening for other viral diseases: Secondary | ICD-10-CM

## 2022-03-31 DIAGNOSIS — R7301 Impaired fasting glucose: Secondary | ICD-10-CM

## 2022-03-31 DIAGNOSIS — E559 Vitamin D deficiency, unspecified: Secondary | ICD-10-CM | POA: Diagnosis not present

## 2022-03-31 DIAGNOSIS — E669 Obesity, unspecified: Secondary | ICD-10-CM | POA: Diagnosis not present

## 2022-03-31 DIAGNOSIS — Z23 Encounter for immunization: Secondary | ICD-10-CM

## 2022-03-31 DIAGNOSIS — Z0000 Encounter for general adult medical examination without abnormal findings: Secondary | ICD-10-CM | POA: Diagnosis not present

## 2022-03-31 LAB — COMPREHENSIVE METABOLIC PANEL
ALT: 7 U/L (ref 0–35)
AST: 12 U/L (ref 0–37)
Albumin: 4.2 g/dL (ref 3.5–5.2)
Alkaline Phosphatase: 62 U/L (ref 39–117)
BUN: 5 mg/dL — ABNORMAL LOW (ref 6–23)
CO2: 30 mEq/L (ref 19–32)
Calcium: 9 mg/dL (ref 8.4–10.5)
Chloride: 103 mEq/L (ref 96–112)
Creatinine, Ser: 0.8 mg/dL (ref 0.40–1.20)
GFR: 94.11 mL/min (ref >60.00)
Glucose, Bld: 85 mg/dL (ref 70–99)
Potassium: 3.4 mEq/L — ABNORMAL LOW (ref 3.5–5.1)
Sodium: 139 mEq/L (ref 135–145)
Total Bilirubin: 0.7 mg/dL (ref 0.2–1.2)
Total Protein: 7.5 g/dL (ref 6.0–8.3)

## 2022-03-31 LAB — CBC WITH DIFFERENTIAL/PLATELET
Basophils Absolute: 0.1 10*3/uL (ref 0.0–0.1)
Basophils Relative: 1 % (ref 0.0–3.0)
Eosinophils Absolute: 0.6 10*3/uL (ref 0.0–0.7)
Eosinophils Relative: 10.1 % — ABNORMAL HIGH (ref 0.0–5.0)
HCT: 40.9 % (ref 36.0–46.0)
Hemoglobin: 13.3 g/dL (ref 12.0–15.0)
Lymphocytes Relative: 27 % (ref 12.0–46.0)
Lymphs Abs: 1.6 10*3/uL (ref 0.7–4.0)
MCHC: 32.5 g/dL (ref 30.0–36.0)
MCV: 80.4 fl (ref 78.0–100.0)
Monocytes Absolute: 0.3 10*3/uL (ref 0.1–1.0)
Monocytes Relative: 5.2 % (ref 3.0–12.0)
Neutro Abs: 3.4 10*3/uL (ref 1.4–7.7)
Neutrophils Relative %: 56.7 % (ref 43.0–77.0)
Platelets: 303 10*3/uL (ref 150.0–400.0)
RBC: 5.09 Mil/uL (ref 3.87–5.11)
RDW: 15.1 % (ref 11.5–15.5)
WBC: 6 10*3/uL (ref 4.0–10.5)

## 2022-03-31 LAB — LIPID PANEL
Cholesterol: 154 mg/dL (ref 0–200)
HDL: 52.6 mg/dL (ref >39.00)
LDL Cholesterol: 91 mg/dL (ref 0–99)
NonHDL: 101.83
Total CHOL/HDL Ratio: 3
Triglycerides: 52 mg/dL (ref 0.0–149.0)
VLDL: 10.4 mg/dL (ref 0.0–40.0)

## 2022-03-31 LAB — HEMOGLOBIN A1C: Hgb A1c MFr Bld: 5.7 % (ref 4.6–6.5)

## 2022-03-31 LAB — TSH: TSH: 1.16 u[IU]/mL (ref 0.35–5.50)

## 2022-03-31 LAB — VITAMIN D 25 HYDROXY (VIT D DEFICIENCY, FRACTURES): VITD: 20.84 ng/mL — ABNORMAL LOW (ref 30.00–100.00)

## 2022-03-31 MED ORDER — VITAMIN D (ERGOCALCIFEROL) 1.25 MG (50000 UNIT) PO CAPS: 50000.0000 [IU] | ORAL_CAPSULE | ORAL | 0 refills | Status: AC

## 2022-03-31 NOTE — Patient Instructions (Signed)
It was great seeing you today   We will follow up with you regarding your lab work   Please let me know if you need anything   

## 2022-03-31 NOTE — Progress Notes (Signed)
Subjective:    Patient ID: Lauren Powell, female    DOB: December 20, 1985, 36 y.o.   MRN: [id number]  HPI Patient presents for yearly preventative medicine examination. She is a pleasant   Weight loss management -is seen by Dr. Juleen China at [facility].  Currently prescribed Ozempic 1 mg weekly. She started at 285 lbs. She is walking about a mile a day and is eating healthy.    Wt Readings from Last 3 Encounters:  03/31/22 232 lb (105.2 kg)  02/23/22 240 lb (108.9 kg)  01/24/22 251 lb 5.2 oz (114 kg)    Vitamin D deficiency- No longer on supplementation.   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 Currently prescribed Singulair and Astelin nasal spray. Her allergies have not been controlled. Is going over to allergy and asthma after this visit   All immunizations and health maintenance protocols were reviewed with the patient and needed orders were placed.  Appropriate screening laboratory values were ordered for the patient including screening of hyperlipidemia, renal function and hepatic function.  Medication reconciliation,  past medical history, social history, problem list and allergies were reviewed in detail with the patient  Goals were established with regard to weight loss, exercise, and  diet in compliance with medications  Review of Systems  Constitutional: Negative.   HENT: Negative.    Eyes: Negative.   Respiratory: Negative.    Cardiovascular: Negative.   Gastrointestinal: Negative.   Endocrine: Negative.   Genitourinary: Negative.   Musculoskeletal: Negative.   Allergic/Immunologic: Negative.   Hematological: Negative.   Psychiatric/Behavioral: Negative.    All other systems reviewed and are negative.  Past Medical History:  Diagnosis Date   Allergy    Eczema    Female infertility    History  of anemia    Obesity    Vitamin D deficiency     Social History   Socioeconomic History   Marital status: Divorced    Spouse name: Not on file   Number of children: Not on file   Years of education: Not on file   Highest education level: Not on file  Occupational History   Occupation: Oncologist  Tobacco Use   Smoking status: Never   Smokeless tobacco: Never  Vaping Use   Vaping Use: Never used  Substance and Sexual Activity   Alcohol use: No   Drug use: No   Sexual activity: Yes    Partners: Male    Birth control/protection: None  Other Topics Concern   Not on file  Social History Narrative   Patient lives with son   Teaches pre school and kindergarten.    Social Determinants of Health   Financial Resource Strain: Not on file  Food Insecurity: Not on file  Transportation Needs: Not on file  Physical Activity: Not on file  Stress: Not on file  Social Connections: Not on file  Intimate Partner Violence: Not on file    Past Surgical History:  Procedure Laterality Date   CESAREAN SECTION     [place] N/A 08/06/2019   Procedure: [place];  Surgeon: Servando Salina, MD;  Location: [facility];  Service: Gynecology;  Laterality: N/A;   TONSILLECTOMY  04/30/2019   TOOTH EXTRACTION N/A     Family History  Problem Relation Age of  Onset   Alcohol abuse Mother    Drug abuse Mother    Bipolar disorder Mother    Depression Mother    Hypertension Paternal Aunt    Hypertension Paternal Grandmother    Prostate cancer Paternal Grandfather     No Known Allergies  Current Outpatient Medications on File Prior to Visit  Medication Sig Dispense Refill   azelastine (ASTELIN) 0.1 % nasal spray Place 2 sprays into both nostrils 2 (two) times daily. Use in each nostril as directed 30 mL 3   ibuprofen (ADVIL) 600 MG tablet Take 1 tablet (600 mg total) by mouth every 6  (six) hours as needed. 30 tablet 0   Lisdexamfetamine Dimesylate (VYVANSE) 60 MG CHEW Chew 60 mg by mouth every morning. 30 tablet 0   montelukast (SINGULAIR) 10 MG tablet Take 10 mg by mouth daily.     Olopatadine HCl 0.2 % SOLN INSTILL 1 DROP INTO AFFECTED EYE EVERY DAY     Semaglutide, 1 MG/DOSE, (OZEMPIC, 1 MG/DOSE,) 4 MG/3ML SOPN Inject 1 mg into the skin once a week. 9 mL 3   No current facility-administered medications on file prior to visit.    BP (!) 120/90   Pulse 94   Temp 98.1 F (36.7 C) (Oral)   Ht 5\' 5"  (1.651 m)   Wt 232 lb (105.2 kg)   LMP 03/27/2022   SpO2 98%   BMI 38.61 kg/m       Objective:   Physical Exam Vitals and nursing note reviewed.  Constitutional:      Appearance: Normal appearance.  Cardiovascular:     Rate and Rhythm: Normal rate and regular rhythm.     Pulses: Normal pulses.     Heart sounds: Normal heart sounds.  Pulmonary:     Effort: Pulmonary effort is normal.     Breath sounds: Normal breath sounds.  Musculoskeletal:        General: Normal range of motion.  Skin:    General: Skin is warm and dry.  Neurological:     General: No focal deficit present.     Mental Status: She is alert and oriented to person, place, and time.  Psychiatric:        Mood and Affect: Mood normal.        Behavior: Behavior normal.        Thought Content: Thought content normal.        Judgment: Judgment normal.       Assessment & Plan:  1. Routine general medical examination at a health care facility - I am very proud of her and her weight loss journey.  - Follow up in one year or sooner if needed - CBC with Differential/Platelet; Future - Comprehensive metabolic panel; Future - Hemoglobin A1c; Future - Lipid panel; Future - TSH; Future  2. Vitamin D deficiency  - VITAMIN D 25 Hydroxy (Vit-D Deficiency, Fractures); Future  3. Impaired fasting glucose, with polyphagia  - CBC with Differential/Platelet; Future - Comprehensive metabolic panel;  Future - Hemoglobin A1c; Future - Lipid panel; Future - TSH; Future  4. Class 2 obesity - Doing amazing with weight loss. Keep it up! - CBC with Differential/Platelet; Future - Comprehensive metabolic panel; Future - Hemoglobin A1c; Future - Lipid panel; Future - TSH; Future  5. Need for hepatitis C screening test  - Hep C Antibody; Future  6. Need for Tdap vaccination  - Tdap vaccine greater than or equal to 7yo IM  7. Need for immunization against influenza  -  Flu Vaccine QUAD 71mo+IM (Fluarix, Fluzone & Alfiuria Quad PF)  Shirline Frees, NP

## 2022-04-01 LAB — HEPATITIS C ANTIBODY: Hepatitis C Ab: NONREACTIVE

## 2022-07-19 ENCOUNTER — Other Ambulatory Visit (HOSPITAL_COMMUNITY): Payer: Self-pay

## 2022-07-19 MED ORDER — LISDEXAMFETAMINE DIMESYLATE 60 MG PO CAPS
60.0000 mg | ORAL_CAPSULE | Freq: Every day | ORAL | 0 refills | Status: DC
  Filled 2022-07-19: qty 30, 30d supply, fill #0

## 2022-07-21 ENCOUNTER — Other Ambulatory Visit (HOSPITAL_COMMUNITY): Payer: Self-pay

## 2022-08-02 ENCOUNTER — Other Ambulatory Visit (HOSPITAL_COMMUNITY): Payer: Self-pay

## 2022-08-02 MED ORDER — LISDEXAMFETAMINE DIMESYLATE 60 MG PO CAPS
60.0000 mg | ORAL_CAPSULE | Freq: Every morning | ORAL | 0 refills | Status: DC
  Filled 2022-08-26 – 2022-10-25 (×2): qty 30, 30d supply, fill #0

## 2022-08-02 MED ORDER — WEGOVY 2.4 MG/0.75ML ~~LOC~~ SOAJ
2.4000 mg | SUBCUTANEOUS | 1 refills | Status: DC
Start: 2022-08-02 — End: 2023-03-24
  Filled 2022-08-02: qty 3, 28d supply, fill #0
  Filled 2022-08-26 – 2022-10-25 (×3): qty 3, 28d supply, fill #1

## 2022-08-02 MED ORDER — MONTELUKAST SODIUM 10 MG PO TABS
10.0000 mg | ORAL_TABLET | Freq: Every day | ORAL | 3 refills | Status: DC
  Filled 2022-08-02: qty 30, 30d supply, fill #0
  Filled 2022-08-26: qty 30, 30d supply, fill #1
  Filled 2022-10-25: qty 30, 30d supply, fill #2

## 2022-08-02 MED ORDER — VITAMIN D (ERGOCALCIFEROL) 1.25 MG (50000 UNIT) PO CAPS
50000.0000 [IU] | ORAL_CAPSULE | ORAL | 1 refills | Status: DC
  Filled 2022-08-02 – 2022-08-26 (×2): qty 24, 84d supply, fill #0

## 2022-08-12 ENCOUNTER — Ambulatory Visit: Payer: BC Managed Care – PPO | Admitting: Adult Health

## 2022-08-23 ENCOUNTER — Other Ambulatory Visit (HOSPITAL_COMMUNITY): Payer: Self-pay

## 2022-08-23 MED ORDER — WEGOVY 2.4 MG/0.75ML ~~LOC~~ SOAJ
SUBCUTANEOUS | 1 refills | Status: DC
  Filled 2022-08-23 – 2022-08-27 (×2): qty 3, 28d supply, fill #0

## 2022-08-23 MED ORDER — LISDEXAMFETAMINE DIMESYLATE 60 MG PO CAPS
60.0000 mg | ORAL_CAPSULE | Freq: Every morning | ORAL | 0 refills | Status: DC
  Filled 2022-08-23 – 2022-09-16 (×3): qty 30, 30d supply, fill #0

## 2022-08-24 ENCOUNTER — Other Ambulatory Visit (HOSPITAL_COMMUNITY): Payer: Self-pay

## 2022-08-27 ENCOUNTER — Other Ambulatory Visit: Payer: Self-pay

## 2022-08-27 ENCOUNTER — Other Ambulatory Visit (HOSPITAL_COMMUNITY): Payer: Self-pay

## 2022-09-02 ENCOUNTER — Other Ambulatory Visit: Payer: Self-pay

## 2022-09-03 ENCOUNTER — Other Ambulatory Visit (HOSPITAL_COMMUNITY): Payer: Self-pay

## 2022-09-05 ENCOUNTER — Other Ambulatory Visit (HOSPITAL_COMMUNITY): Payer: Self-pay

## 2022-09-06 ENCOUNTER — Other Ambulatory Visit: Payer: Self-pay

## 2022-09-07 ENCOUNTER — Other Ambulatory Visit (HOSPITAL_COMMUNITY): Payer: Self-pay

## 2022-09-07 ENCOUNTER — Other Ambulatory Visit: Payer: Self-pay

## 2022-09-15 ENCOUNTER — Other Ambulatory Visit: Payer: Self-pay

## 2022-09-15 ENCOUNTER — Other Ambulatory Visit (HOSPITAL_COMMUNITY): Payer: Self-pay

## 2022-09-16 ENCOUNTER — Other Ambulatory Visit (HOSPITAL_COMMUNITY): Payer: Self-pay

## 2022-09-16 ENCOUNTER — Other Ambulatory Visit: Payer: Self-pay

## 2022-09-16 MED ORDER — ONDANSETRON HCL 4 MG PO TABS
4.0000 mg | ORAL_TABLET | Freq: Every day | ORAL | 0 refills | Status: DC | PRN
  Filled 2022-09-16: qty 30, 30d supply, fill #0
  Filled 2023-05-02: qty 18, 21d supply, fill #0

## 2022-09-24 ENCOUNTER — Other Ambulatory Visit (HOSPITAL_COMMUNITY): Payer: Self-pay

## 2022-09-24 MED ORDER — LISDEXAMFETAMINE DIMESYLATE 60 MG PO CAPS
60.0000 mg | ORAL_CAPSULE | Freq: Every morning | ORAL | 0 refills | Status: DC
  Filled 2022-12-04: qty 30, 30d supply, fill #0

## 2022-09-27 ENCOUNTER — Other Ambulatory Visit (HOSPITAL_COMMUNITY): Payer: Self-pay

## 2022-10-26 ENCOUNTER — Other Ambulatory Visit: Payer: Self-pay

## 2022-10-28 ENCOUNTER — Other Ambulatory Visit (HOSPITAL_COMMUNITY): Payer: Self-pay

## 2022-10-28 MED ORDER — WEGOVY 2.4 MG/0.75ML ~~LOC~~ SOAJ
2.4000 mg | SUBCUTANEOUS | 0 refills | Status: DC
  Filled 2022-10-28: qty 3, 28d supply, fill #0

## 2022-10-29 ENCOUNTER — Other Ambulatory Visit (HOSPITAL_COMMUNITY): Payer: Self-pay

## 2022-11-05 ENCOUNTER — Other Ambulatory Visit (HOSPITAL_COMMUNITY): Payer: Self-pay

## 2022-11-15 ENCOUNTER — Other Ambulatory Visit (HOSPITAL_COMMUNITY): Payer: Self-pay

## 2022-11-15 MED ORDER — MONTELUKAST SODIUM 10 MG PO TABS
ORAL_TABLET | ORAL | 3 refills | Status: DC
  Filled 2022-11-15: qty 30, 30d supply, fill #0

## 2022-11-15 MED ORDER — LEVOCETIRIZINE DIHYDROCHLORIDE 5 MG PO TABS
5.0000 mg | ORAL_TABLET | Freq: Every evening | ORAL | 3 refills | Status: AC
  Filled 2022-11-15 – 2023-05-02 (×2): qty 30, 30d supply, fill #0

## 2022-11-16 ENCOUNTER — Other Ambulatory Visit (HOSPITAL_COMMUNITY): Payer: Self-pay

## 2022-11-16 MED ORDER — LISDEXAMFETAMINE DIMESYLATE 70 MG PO CAPS
70.0000 mg | ORAL_CAPSULE | Freq: Every morning | ORAL | 0 refills | Status: DC
  Filled 2022-11-16: qty 30, 30d supply, fill #0

## 2022-11-22 ENCOUNTER — Other Ambulatory Visit (HOSPITAL_COMMUNITY): Payer: Self-pay

## 2022-11-22 MED ORDER — OZEMPIC (2 MG/DOSE) 8 MG/3ML ~~LOC~~ SOPN
2.0000 mg | PEN_INJECTOR | SUBCUTANEOUS | 2 refills | Status: DC
Start: 2022-11-21 — End: 2023-03-24
  Filled 2022-11-22: qty 3, 28d supply, fill #0

## 2022-11-23 ENCOUNTER — Other Ambulatory Visit (HOSPITAL_COMMUNITY): Payer: Self-pay

## 2022-11-25 ENCOUNTER — Other Ambulatory Visit (HOSPITAL_COMMUNITY): Payer: Self-pay

## 2022-11-30 ENCOUNTER — Other Ambulatory Visit (HOSPITAL_COMMUNITY): Payer: Self-pay

## 2022-12-03 ENCOUNTER — Other Ambulatory Visit (HOSPITAL_COMMUNITY): Payer: Self-pay

## 2022-12-04 ENCOUNTER — Other Ambulatory Visit (HOSPITAL_COMMUNITY): Payer: Self-pay

## 2022-12-06 ENCOUNTER — Other Ambulatory Visit: Payer: Self-pay

## 2022-12-06 ENCOUNTER — Other Ambulatory Visit (HOSPITAL_COMMUNITY): Payer: Self-pay

## 2022-12-07 ENCOUNTER — Other Ambulatory Visit: Payer: Self-pay

## 2022-12-07 ENCOUNTER — Other Ambulatory Visit (HOSPITAL_COMMUNITY): Payer: Self-pay

## 2022-12-07 MED ORDER — LISDEXAMFETAMINE DIMESYLATE 70 MG PO CAPS
70.0000 mg | ORAL_CAPSULE | Freq: Every day | ORAL | 0 refills | Status: DC
  Filled 2022-12-07: qty 30, 30d supply, fill #0

## 2022-12-08 ENCOUNTER — Other Ambulatory Visit (HOSPITAL_COMMUNITY): Payer: Self-pay

## 2022-12-10 ENCOUNTER — Other Ambulatory Visit: Payer: Self-pay

## 2022-12-10 ENCOUNTER — Other Ambulatory Visit (HOSPITAL_COMMUNITY): Payer: Self-pay

## 2022-12-11 ENCOUNTER — Other Ambulatory Visit (HOSPITAL_COMMUNITY): Payer: Self-pay

## 2022-12-13 ENCOUNTER — Other Ambulatory Visit (HOSPITAL_COMMUNITY): Payer: Self-pay

## 2023-02-10 ENCOUNTER — Other Ambulatory Visit (HOSPITAL_COMMUNITY): Payer: Self-pay

## 2023-02-10 MED ORDER — LISDEXAMFETAMINE DIMESYLATE 70 MG PO CAPS
70.0000 mg | ORAL_CAPSULE | Freq: Every morning | ORAL | 0 refills | Status: DC
  Filled 2023-02-10: qty 30, 30d supply, fill #0

## 2023-02-10 MED ORDER — VITAMIN D (ERGOCALCIFEROL) 1.25 MG (50000 UNIT) PO CAPS
50000.0000 [IU] | ORAL_CAPSULE | ORAL | 0 refills | Status: DC
  Filled 2023-02-10: qty 24, 84d supply, fill #0

## 2023-02-11 ENCOUNTER — Other Ambulatory Visit (HOSPITAL_COMMUNITY): Payer: Self-pay

## 2023-02-22 ENCOUNTER — Other Ambulatory Visit (HOSPITAL_COMMUNITY): Payer: Self-pay

## 2023-03-15 ENCOUNTER — Other Ambulatory Visit (HOSPITAL_COMMUNITY): Payer: Self-pay

## 2023-03-15 MED ORDER — LISDEXAMFETAMINE DIMESYLATE 70 MG PO CAPS
70.0000 mg | ORAL_CAPSULE | Freq: Every morning | ORAL | 0 refills | Status: DC
  Filled 2023-03-15: qty 30, 30d supply, fill #0

## 2023-03-15 MED ORDER — VITAMIN D (ERGOCALCIFEROL) 1.25 MG (50000 UNIT) PO CAPS
ORAL_CAPSULE | ORAL | 0 refills | Status: DC
  Filled 2023-03-15: qty 24, 84d supply, fill #0

## 2023-03-24 ENCOUNTER — Other Ambulatory Visit (HOSPITAL_COMMUNITY): Payer: Self-pay

## 2023-03-24 ENCOUNTER — Encounter: Payer: Self-pay | Admitting: Adult Health

## 2023-03-24 ENCOUNTER — Ambulatory Visit: Payer: BC Managed Care – PPO | Admitting: Adult Health

## 2023-03-24 VITALS — BP 120/86 | HR 83 | Temp 98.2°F | Ht 65.0 in | Wt 224.0 lb

## 2023-03-24 DIAGNOSIS — J988 Other specified respiratory disorders: Secondary | ICD-10-CM

## 2023-03-24 MED ORDER — DOXYCYCLINE HYCLATE 100 MG PO CAPS
100.0000 mg | ORAL_CAPSULE | Freq: Two times a day (BID) | ORAL | 0 refills | Status: DC
  Filled 2023-03-24: qty 14, 7d supply, fill #0

## 2023-03-24 MED ORDER — PREDNISONE 10 MG PO TABS
10.0000 mg | ORAL_TABLET | Freq: Every day | ORAL | 0 refills | Status: DC
  Filled 2023-03-24: qty 5, 5d supply, fill #0

## 2023-03-24 NOTE — Progress Notes (Signed)
Subjective:    Patient ID: Lauren Powell, female    DOB: 1985/08/07, 37 y.o.   MRN: [id number]  Sinus Problem This is a new problem. The current episode started in the past 7 days. The problem has been gradually worsening since onset. There has been no fever. Associated symptoms include congestion, coughing (productive with white and yellow sputum) and sinus pressure. Pertinent negatives include no ear pain, headaches, shortness of breath, sore throat or swollen glands. (Wheezing at night ) Past treatments include oral decongestants. The treatment provided no relief.     Review of Systems  HENT:  Positive for congestion and sinus pressure. Negative for ear pain and sore throat.   Respiratory:  Positive for cough (productive with white and yellow sputum). Negative for shortness of breath.   Neurological:  Negative for headaches.   Past Medical History:  Diagnosis Date   Allergy    Eczema    Female infertility    History of anemia    Obesity    Vitamin D deficiency     Social History   Socioeconomic History   Marital status: Divorced    Spouse name: Not on file   Number of children: Not on file   Years of education: Not on file   Highest education level: Not on file  Occupational History   Occupation: Midwife  Tobacco Use   Smoking status: Never   Smokeless tobacco: Never  Vaping Use   Vaping status: Never Used  Substance and Sexual Activity   Alcohol use: No   Drug use: No   Sexual activity: Yes    Partners: Male    Birth control/protection: None  Other Topics Concern   Not on file  Social History Narrative   Patient lives with son   Teaches pre school and kindergarten.    Social Determinants of Health   Financial Resource Strain: Not on file  Food Insecurity: Not on file  Transportation Needs: Not on file  Physical Activity: Not on file  Stress: Not on file  Social Connections: Not on file  Intimate Partner Violence: Not on file     Past Surgical History:  Procedure Laterality Date   CESAREAN SECTION     DILATATION & CURETTAGE/HYSTEROSCOPY WITH MYOSURE N/A 08/06/2019   Procedure: DILATATION & CURETTAGE/HYSTEROSCOPY WITH MYOSURE;  Surgeon: Maxie Better, MD;  [facility];  Service: Gynecology;  Laterality: N/A;   TONSILLECTOMY  04/30/2019   TOOTH EXTRACTION N/A     Family History  Problem Relation Age of Onset   Alcohol abuse Mother    Drug abuse Mother    Bipolar disorder Mother    Depression Mother    Hypertension Paternal Aunt    Hypertension Paternal Grandmother    Prostate cancer Paternal Grandfather     No Known Allergies  Current Outpatient Medications on File Prior to Visit  Medication Sig Dispense Refill   ibuprofen (ADVIL) 600 MG tablet Take 1 tablet (600 mg total) by mouth every 6 (six) hours as needed. 30 tablet 0   levocetirizine (XYZAL) 5 MG tablet Take 1 tablet by mouth in the evening once daily 30 tablet 3   lisdexamfetamine (VYVANSE) 60 MG capsule Take 1 capsule (60 mg total) by mouth every morning. 30 capsule 0   lisdexamfetamine (VYVANSE) 60 MG capsule Take 1 capsule (60 mg total) by mouth in the morning. 30 capsule 0   lisdexamfetamine (VYVANSE) 60 MG capsule Take 1 capsule (60 mg total) by mouth in  the morning. 30 capsule 0   lisdexamfetamine (VYVANSE) 70 MG capsule Take 1 capsule (70 mg) by mouth in the morning. 30 capsule 0   lisdexamfetamine (VYVANSE) 70 MG capsule Take 1 capsule (70 mg total) by mouth daily in the morning. 30 capsule 0   lisdexamfetamine (VYVANSE) 70 MG capsule Take 1 capsule (70 mg total) by mouth every morning. 30 capsule 0   lisdexamfetamine (VYVANSE) 70 MG capsule Take 1 capsule (70 mg total) by mouth every morning. 30 capsule 0   montelukast (SINGULAIR) 10 MG tablet Take 10 mg by mouth daily.     montelukast (SINGULAIR) 10 MG tablet Take 1 tablet (10 mg total) by mouth daily. 30 tablet 3   montelukast (SINGULAIR) 10 MG tablet Take  1 tablet by mouth once daily 30 tablet 3   Olopatadine HCl 0.2 % SOLN INSTILL 1 DROP INTO AFFECTED EYE EVERY DAY     ondansetron (ZOFRAN) 4 MG tablet Take 1 tablet (4 mg total) by mouth once a day as needed. 30 tablet 0   Semaglutide, 2 MG/DOSE, (OZEMPIC, 2 MG/DOSE,) 8 MG/3ML SOPN Inject 2 mg into the skin once a week. 3 mL 2   Semaglutide-Weight Management (WEGOVY) 2.4 MG/0.75ML SOAJ Inject 2.4 mg into the skin once a week. 9 mL 1   Semaglutide-Weight Management (WEGOVY) 2.4 MG/0.75ML SOAJ Inject 2.4mg  under the skin once weekly. 9 mL 1   Semaglutide-Weight Management (WEGOVY) 2.4 MG/0.75ML SOAJ Inject 2.4 mg (0.75 ml) into the skin once a week. 3 mL 0   Vitamin D, Ergocalciferol, (DRISDOL) 1.25 MG (50000 UNIT) CAPS capsule Take 1 capsule (50,000 Units total) by mouth 2 (two) times a week. 24 capsule 1   Vitamin D, Ergocalciferol, (DRISDOL) 1.25 MG (50000 UNIT) CAPS capsule Take 1 capsule (50,000 Units total) by mouth 2 (two) times a week. 24 capsule 0   Vitamin D, Ergocalciferol, (DRISDOL) 1.25 MG (50000 UNIT) CAPS capsule TAKE 1 CAPSULE BY MOUTH TWICE WEEKLY 84 days 24 capsule 0   No current facility-administered medications on file prior to visit.    BP 120/86   Pulse 83   Temp 98.2 F (36.8 C) (Oral)   Ht 5\' 5"  (1.651 m)   Wt 224 lb (101.6 kg)   SpO2 97%   BMI 37.28 kg/m       Objective:   Physical Exam Vitals and nursing note reviewed.  Constitutional:      Appearance: Normal appearance. She is obese.  HENT:     Right Ear: No middle ear effusion. Tympanic membrane is not erythematous.     Left Ear:  No middle ear effusion. Tympanic membrane is not erythematous.     Nose: Congestion and rhinorrhea present. Rhinorrhea is purulent.     Right Turbinates: Enlarged and swollen.     Left Turbinates: Enlarged and swollen.     Right Sinus: Maxillary sinus tenderness present. No frontal sinus tenderness.     Left Sinus: Maxillary sinus tenderness present. No frontal sinus  tenderness.     Mouth/Throat:     Lips: Pink.     Mouth: Mucous membranes are moist.     Pharynx: Oropharynx is clear. No pharyngeal swelling, oropharyngeal exudate or posterior oropharyngeal erythema.  Cardiovascular:     Rate and Rhythm: Normal rate and regular rhythm.     Pulses: Normal pulses.     Heart sounds: Normal heart sounds.  Pulmonary:     Effort: Pulmonary effort is normal. No respiratory distress.     Breath  sounds: Normal breath sounds. No stridor. No wheezing or rhonchi.  Neurological:     Mental Status: She is alert.  Psychiatric:        Mood and Affect: Mood normal.        Behavior: Behavior normal.        Thought Content: Thought content normal.        Judgment: Judgment normal.       Assessment & Plan:  1. Respiratory infection  - Will treat due to symptoms and duration. Will treat sinusitis with Doxycycline. Prednisone will help with cough and wheezing.  - Follow up 2-3 days if not improving or sooner if symptoms worsen  - doxycycline (VIBRAMYCIN) 100 MG capsule; Take 1 capsule (100 mg total) by mouth 2 (two) times daily.  Dispense: 14 capsule; Refill: 0 - predniSONE (DELTASONE) 10 MG tablet; Take 1 tablet (10 mg total) by mouth daily with breakfast.  Dispense: 5 tablet; Refill: 0  Shirline Frees, NP

## 2023-03-27 ENCOUNTER — Encounter (HOSPITAL_COMMUNITY): Payer: Self-pay | Admitting: Emergency Medicine

## 2023-03-27 ENCOUNTER — Emergency Department (HOSPITAL_COMMUNITY)
Admission: EM | Admit: 2023-03-27 | Discharge: 2023-03-28 | Disposition: A | Discharge status: Home / Self Care | Payer: BC Managed Care – PPO | Attending: Emergency Medicine | Admitting: Emergency Medicine

## 2023-03-27 ENCOUNTER — Other Ambulatory Visit: Payer: Self-pay

## 2023-03-27 ENCOUNTER — Emergency Department (HOSPITAL_COMMUNITY): Payer: BC Managed Care – PPO

## 2023-03-27 ENCOUNTER — Ambulatory Visit (HOSPITAL_COMMUNITY)
Admission: EM | Admit: 2023-03-27 | Discharge: 2023-03-27 | Disposition: A | Discharge status: Other Acute Inpatient Hospital | Payer: BC Managed Care – PPO | Attending: Emergency Medicine | Admitting: Emergency Medicine

## 2023-03-27 DIAGNOSIS — R110 Nausea: Secondary | ICD-10-CM

## 2023-03-27 DIAGNOSIS — R079 Chest pain, unspecified: Secondary | ICD-10-CM | POA: Diagnosis not present

## 2023-03-27 LAB — CBC
HCT: 41.8 % (ref 36.0–46.0)
Hemoglobin: 13.1 g/dL (ref 12.0–15.0)
MCH: 26.4 pg (ref 26.0–34.0)
MCHC: 31.3 g/dL (ref 30.0–36.0)
MCV: 84.3 fL (ref 80.0–100.0)
Platelets: 303 10*3/uL (ref 150–400)
RBC: 4.96 MIL/uL (ref 3.87–5.11)
RDW: 13.7 % (ref 11.5–15.5)
WBC: 9.2 10*3/uL (ref 4.0–10.5)
nRBC: 0 % (ref 0.0–0.2)

## 2023-03-27 LAB — BASIC METABOLIC PANEL
Anion gap: 9 (ref 5–15)
BUN: 10 mg/dL (ref 6–20)
CO2: 26 mmol/L (ref 22–32)
Calcium: 9.1 mg/dL (ref 8.9–10.3)
Chloride: 105 mmol/L (ref 98–111)
Creatinine, Ser: 0.86 mg/dL (ref 0.44–1.00)
GFR, Estimated: >60 mL/min (ref >60)
Glucose, Bld: 88 mg/dL (ref 70–99)
Potassium: 3.9 mmol/L (ref 3.5–5.1)
Sodium: 140 mmol/L (ref 135–145)

## 2023-03-27 LAB — HCG, SERUM, QUALITATIVE: Preg, Serum: NEGATIVE

## 2023-03-27 LAB — TROPONIN I (HIGH SENSITIVITY)
Troponin I (High Sensitivity): 3 ng/L (ref <18)
Troponin I (High Sensitivity): 3 ng/L (ref <18)

## 2023-03-27 MED ORDER — DEXAMETHASONE 4 MG PO TABS
10.0000 mg | ORAL_TABLET | Freq: Once | ORAL | Status: AC
  Administered 2023-03-27: 10 mg via ORAL
  Filled 2023-03-27: qty 3

## 2023-03-27 NOTE — ED Triage Notes (Signed)
Pt sent from UC for evaluation of cp, began this morning. Pt states it is sharp and begins in L neck and radiates down into L chest. Pain is constant, denies sob, feels nauseous

## 2023-03-27 NOTE — ED Provider Notes (Signed)
[facility] Emergency Department Provider Note MRN:  [id number]  Arrival date & time: 03/27/23     Chief Complaint   Chest Pain   History of Present Illness   Lauren Powell is a 37 y.o. year-old female with no pertinent past medical history presenting to the ED with chief complaint of chest pain.  Cough for the past 3 to 4 days, dry, persistent.  Also with sinus congestion.  Has been on an antibiotic for sinus infection for the past 3 days.  Now having soreness to the left side of the neck and left chest.  Worse with cough, worse with certain movements and positions.  Described as sharp.  No shortness of breath, no abdominal pain, no leg pain or swelling, no fever.  Review of Systems  A thorough review of systems was obtained and all systems are negative except as noted in the HPI and PMH.   Patient's Health History    Past Medical History:  Diagnosis Date   Allergy    Eczema    Female infertility    History of anemia    Obesity    Vitamin D deficiency     Past Surgical History:  Procedure Laterality Date   CESAREAN SECTION     DILATATION & CURETTAGE/HYSTEROSCOPY WITH MYOSURE N/A 08/06/2019   Procedure: DILATATION & CURETTAGE/HYSTEROSCOPY WITH MYOSURE;  Surgeon: Maxie Better, MD;  Location: [facility];  Service: Gynecology;  Laterality: N/A;   TONSILLECTOMY  04/30/2019   TOOTH EXTRACTION N/A     Family History  Problem Relation Age of Onset   Alcohol abuse Mother    Drug abuse Mother    Bipolar disorder Mother    Depression Mother    Hypertension Paternal Aunt    Hypertension Paternal Grandmother    Prostate cancer Paternal Grandfather     Social History   Socioeconomic History   Marital status: Divorced    Spouse name: Not on file   Number of children: Not on file   Years of education: Not on file   Highest education level: Not on file  Occupational History   Occupation: Midwife  Tobacco Use    Smoking status: Never   Smokeless tobacco: Never  Vaping Use   Vaping status: Never Used  Substance and Sexual Activity   Alcohol use: No   Drug use: No   Sexual activity: Yes    Partners: Male    Birth control/protection: None  Other Topics Concern   Not on file  Social History Narrative   Patient lives with son   Teaches pre school and kindergarten.    Social Determinants of Health   Financial Resource Strain: Not on file  Food Insecurity: Not on file  Transportation Needs: Not on file  Physical Activity: Not on file  Stress: Not on file  Social Connections: Not on file  Intimate Partner Violence: Not on file     Physical Exam   Vitals:   03/27/23 1853 03/27/23 2343  BP: (!) 108/91 115/80  Pulse: 83 82  Resp: 18 16  Temp: 98.8 F (37.1 C) 98.2 F (36.8 C)  SpO2: 100% 100%    CONSTITUTIONAL: Well-appearing, NAD NEURO/PSYCH:  Alert and oriented x 3, no focal deficits EYES:  eyes equal and reactive ENT/NECK:  no LAD, no JVD CARDIO: Regular rate, well-perfused, normal S1 and S2 PULM:  CTAB no wheezing or rhonchi GI/GU:  non-distended, non-tender MSK/SPINE:  No gross deformities, no edema SKIN:  no rash,  atraumatic   *Additional and/or pertinent findings included in MDM below  Diagnostic and Interventional Summary    EKG Interpretation Date/Time:  March 27, 2023 at 18:40:21 Ventricular Rate:   90 PR Interval:   142 QRS Duration:   78 QT Interval:   350 QTC Calculation:  428 R Axis:      Text Interpretation: Sinus rhythm       Labs Reviewed  BASIC METABOLIC PANEL  CBC  HCG, SERUM, QUALITATIVE  TROPONIN I (HIGH SENSITIVITY)  TROPONIN I (HIGH SENSITIVITY)    DG Chest 2 View  Final Result      Medications  dexamethasone (DECADRON) tablet 10 mg (10 mg Oral Given 03/27/23 2342)     Procedures  /  Critical Care Procedures  ED Course and Medical Decision Making  Initial Impression and Ddx Patient is well-appearing in no acute distress  with normal vital signs.  Lungs clear, pain is focal and left-sided and worse with movement, palpation.  Favoring MSK in the setting of recent viral URI.  Being covered for bacterial sinusitis with antibiotics currently.  PERC negative, really no cardiovascular risk factors.  Past medical/surgical history that increases complexity of ED encounter: None  Interpretation of Diagnostics I personally reviewed the EKG and my interpretation is as follows: Sinus rhythm without concerning ischemic findings  Labs reassuring with no significant blood count or electrolyte disturbance.  Troponin negative x 2.  Patient Reassessment and Ultimate Disposition/Management     Discharge  Patient management required discussion with the following services or consulting groups:  None  Complexity of Problems Addressed Acute illness or injury that poses threat of life of bodily function  Additional Data Reviewed and Analyzed Further history obtained from: None  Additional Factors Impacting ED Encounter Risk None  Elmer Sow. Pilar Plate, MD [facility] Emergency Medicine [facility] [email or web address]  Final Clinical Impressions(s) / ED Diagnoses     ICD-10-CM   1. Chest pain, unspecified type  R07.9       ED Discharge Orders     None        Discharge Instructions Discussed with and Provided to Patient:    Discharge Instructions      You were evaluated in the Emergency Department and after careful evaluation, we did not find any emergent condition requiring admission or further testing in the hospital.  Your exam/testing today was overall reassuring.  Recommend continuing with your recently prescribed antibiotic, Tylenol or Motrin for discomfort.  Please return to the Emergency Department if you experience any worsening of your condition.  Thank you for allowing [place] to be a part of your care.       Sabas Sous, MD 03/27/23 [phone]

## 2023-03-27 NOTE — ED Notes (Signed)
Patient is being discharged from the Urgent Care and sent to the Emergency Department via POV . Per Autumn Patty, PA, patient is in need of higher level of care due to chest pain. Patient is aware and verbalizes understanding of plan of care.  Vitals:   03/27/23 1802  BP: 118/88  Pulse: 88  Resp: 16  Temp: 98 F (36.7 C)  SpO2: 98%

## 2023-03-27 NOTE — ED Notes (Signed)
ED Provider at bedside. 

## 2023-03-27 NOTE — ED Provider Notes (Signed)
Left sided chest pain started this morning Radiates into left neck. Feels pressure sensation on chest Having nausea but no vomiting  No shortness of breath  Denies cardiac history  She is currently being treated for sinus/respiratory infection with doxy and prednisone  Vitals are stable. She is well appearing EKG normal sinus, ventricular rate 85 bpm. No ST or T wave changes. Compared to reading from 08/2019, no change   Discussed needs troponins to r/o cardiac emergency. Sent to ED for higher level of care.    Marlow Baars, [place] 03/27/23 [phone]

## 2023-03-27 NOTE — Discharge Instructions (Signed)
You were evaluated in the Emergency Department and after careful evaluation, we did not find any emergent condition requiring admission or further testing in the hospital.  Your exam/testing today was overall reassuring.  Recommend continuing with your recently prescribed antibiotic, Tylenol or Motrin for discomfort.  Please return to the Emergency Department if you experience any worsening of your condition.  Thank you for allowing [place] to be a part of your care.

## 2023-03-27 NOTE — ED Triage Notes (Signed)
PT c/o chest pain that started this morning. Pt states it hurts all the time.When lying down, sitting, standing.  Pain is on upper left chest and up into neck.

## 2023-03-31 ENCOUNTER — Other Ambulatory Visit (HOSPITAL_COMMUNITY): Payer: Self-pay

## 2023-03-31 ENCOUNTER — Other Ambulatory Visit: Payer: Self-pay

## 2023-03-31 MED ORDER — TRAZODONE HCL 50 MG PO TABS
50.0000 mg | ORAL_TABLET | Freq: Every day | ORAL | 1 refills | Status: DC
  Filled 2023-03-31: qty 30, 30d supply, fill #0

## 2023-03-31 MED ORDER — LISDEXAMFETAMINE DIMESYLATE 70 MG PO CAPS
70.0000 mg | ORAL_CAPSULE | Freq: Every morning | ORAL | 0 refills | Status: DC
  Filled 2023-04-18: qty 30, 30d supply, fill #0

## 2023-03-31 MED ORDER — VITAMIN D (ERGOCALCIFEROL) 1.25 MG (50000 UNIT) PO CAPS
50000.0000 [IU] | ORAL_CAPSULE | ORAL | 0 refills | Status: DC
  Filled 2023-03-31: qty 24, 84d supply, fill #0

## 2023-03-31 MED ORDER — TRIAMCINOLONE ACETONIDE 0.1 % EX OINT
TOPICAL_OINTMENT | CUTANEOUS | 0 refills | Status: AC
  Filled 2023-03-31: qty 454, 30d supply, fill #0

## 2023-04-01 ENCOUNTER — Other Ambulatory Visit (HOSPITAL_COMMUNITY): Payer: Self-pay

## 2023-04-02 ENCOUNTER — Other Ambulatory Visit (HOSPITAL_COMMUNITY): Payer: Self-pay

## 2023-04-04 ENCOUNTER — Other Ambulatory Visit (HOSPITAL_COMMUNITY): Payer: Self-pay

## 2023-04-14 ENCOUNTER — Other Ambulatory Visit: Payer: BC Managed Care – PPO

## 2023-04-14 ENCOUNTER — Ambulatory Visit: Payer: BC Managed Care – PPO | Admitting: Adult Health

## 2023-04-14 ENCOUNTER — Encounter: Payer: Self-pay | Admitting: Adult Health

## 2023-04-14 VITALS — BP 110/80 | HR 57 | Temp 98.9°F | Ht 64.5 in | Wt 229.0 lb

## 2023-04-14 DIAGNOSIS — E559 Vitamin D deficiency, unspecified: Secondary | ICD-10-CM

## 2023-04-14 DIAGNOSIS — Z0000 Encounter for general adult medical examination without abnormal findings: Secondary | ICD-10-CM

## 2023-04-14 DIAGNOSIS — J302 Other seasonal allergic rhinitis: Secondary | ICD-10-CM

## 2023-04-14 DIAGNOSIS — R7301 Impaired fasting glucose: Secondary | ICD-10-CM

## 2023-04-14 DIAGNOSIS — E66812 Obesity, class 2: Secondary | ICD-10-CM

## 2023-04-14 DIAGNOSIS — Z23 Encounter for immunization: Secondary | ICD-10-CM

## 2023-04-14 LAB — CBC WITH DIFFERENTIAL/PLATELET
Basophils Absolute: 0.1 10*3/uL (ref 0.0–0.1)
Basophils Relative: 1 % (ref 0.0–3.0)
Eosinophils Absolute: 0.3 10*3/uL (ref 0.0–0.7)
Eosinophils Relative: 4.7 % (ref 0.0–5.0)
HCT: 38.4 % (ref 36.0–46.0)
Hemoglobin: 12.4 g/dL (ref 12.0–15.0)
Lymphocytes Relative: 28.5 % (ref 12.0–46.0)
Lymphs Abs: 2 10*3/uL (ref 0.7–4.0)
MCHC: 32.4 g/dL (ref 30.0–36.0)
MCV: 83.1 fL (ref 78.0–100.0)
Monocytes Absolute: 0.3 10*3/uL (ref 0.1–1.0)
Monocytes Relative: 4.7 % (ref 3.0–12.0)
Neutro Abs: 4.3 10*3/uL (ref 1.4–7.7)
Neutrophils Relative %: 61.1 % (ref 43.0–77.0)
Platelets: 313 10*3/uL (ref 150.0–400.0)
RBC: 4.62 Mil/uL (ref 3.87–5.11)
RDW: 14.9 % (ref 11.5–15.5)
WBC: 7 10*3/uL (ref 4.0–10.5)

## 2023-04-14 LAB — COMPREHENSIVE METABOLIC PANEL
ALT: 9 U/L (ref 0–35)
AST: 12 U/L (ref 0–37)
Albumin: 3.9 g/dL (ref 3.5–5.2)
Alkaline Phosphatase: 60 U/L (ref 39–117)
BUN: 7 mg/dL (ref 6–23)
CO2: 29 meq/L (ref 19–32)
Calcium: 8.7 mg/dL (ref 8.4–10.5)
Chloride: 103 meq/L (ref 96–112)
Creatinine, Ser: 0.8 mg/dL (ref 0.40–1.20)
GFR: 93.43 mL/min (ref >60.00)
Glucose, Bld: 90 mg/dL (ref 70–99)
Potassium: 3.6 meq/L (ref 3.5–5.1)
Sodium: 139 meq/L (ref 135–145)
Total Bilirubin: 0.9 mg/dL (ref 0.2–1.2)
Total Protein: 6.8 g/dL (ref 6.0–8.3)

## 2023-04-14 LAB — LIPID PANEL
Cholesterol: 156 mg/dL (ref 0–200)
HDL: 56.4 mg/dL (ref >39.00)
LDL Cholesterol: 89 mg/dL (ref 0–99)
NonHDL: 99.69
Total CHOL/HDL Ratio: 3
Triglycerides: 54 mg/dL (ref 0.0–149.0)
VLDL: 10.8 mg/dL (ref 0.0–40.0)

## 2023-04-14 LAB — TSH: TSH: 1.19 u[IU]/mL (ref 0.35–5.50)

## 2023-04-14 LAB — HEMOGLOBIN A1C: Hgb A1c MFr Bld: 5.6 % (ref 4.6–6.5)

## 2023-04-14 LAB — VITAMIN D 25 HYDROXY (VIT D DEFICIENCY, FRACTURES): VITD: 17.4 ng/mL — ABNORMAL LOW (ref 30.00–100.00)

## 2023-04-14 NOTE — Progress Notes (Signed)
Subjective:    Patient ID: Lauren Powell, female    DOB: [birth year], 37 y.o.   MRN: [id number]  HPI  Patient presents for yearly preventative medicine examination. She is a pleasant 37 year old female who  has a past medical history of Allergy, Eczema, Female infertility, History of anemia, Obesity, and Vitamin D deficiency.  Weight loss management -is seen by Dr. Earlene Plater at [facility].  Currently prescribed Vyvance 70 mg daily as Ozempic stopped being covered.  She started at 285 lbs. She is walking about a mile a day and is eating healthy.    Wt Readings from Last 3 Encounters:  04/14/23 229 lb (103.9 kg)  03/27/23 224 lb (101.6 kg)  03/24/23 224 lb (101.6 kg)    Vitamin D deficiency- mantained on Vitamin D 50,000 units by Dr. Earlene Plater.  Impaired fasting glucose - managed with weight loss  Lab Results  Component Value Date   HGBA1C 5.7 03/31/2022   HGBA1C 5.7 (H) 08/11/2021   HGBA1C 6.0 03/27/2021    Sesonal Allergies - managed by [facility]. Currently prescribed Singulair and Astelin nasal spray. Her allergies have not been controlled. Is going over to allergy and asthma after this visit   Insomnia - was recently started on Trazodone 50 mg at bedtime. She finds this helps her fall asleep an stay asleep.   All immunizations and health maintenance protocols were reviewed with the patient and needed orders were placed.  Appropriate screening laboratory values were ordered for the patient including screening of hyperlipidemia, renal function and hepatic function.  Medication reconciliation,  past medical history, social history, problem list and allergies were reviewed in detail with the patient  Goals were established with regard to weight loss, exercise, and  diet in compliance with medications  Review of Systems  Constitutional: Negative.   HENT: Negative.    Eyes: Negative.   Respiratory: Negative.    Cardiovascular: Negative.    Gastrointestinal: Negative.   Endocrine: Negative.   Genitourinary: Negative.   Musculoskeletal: Negative.   Skin: Negative.   Allergic/Immunologic: Negative.   Neurological: Negative.   Hematological: Negative.   Psychiatric/Behavioral: Negative.     Past Medical History:  Diagnosis Date   Allergy    Eczema    Female infertility    History of anemia    Obesity    Vitamin D deficiency     Social History   Socioeconomic History   Marital status: Divorced    Spouse name: Not on file   Number of children: Not on file   Years of education: Not on file   Highest education level: Not on file  Occupational History   Occupation: Midwife  Tobacco Use   Smoking status: Never   Smokeless tobacco: Never  Vaping Use   Vaping status: Never Used  Substance and Sexual Activity   Alcohol use: No   Drug use: No   Sexual activity: Yes    Partners: Male    Birth control/protection: None  Other Topics Concern   Not on file  Social History Narrative   Patient lives with son   Teaches pre school and kindergarten.    Social Determinants of Health   Financial Resource Strain: Not on file  Food Insecurity: Not on file  Transportation Needs: Not on file  Physical Activity: Not on file  Stress: Not on file  Social Connections: Not on file  Intimate Partner Violence: Not on file    Past Surgical  History:  Procedure Laterality Date   CESAREAN SECTION     DILATATION & CURETTAGE/HYSTEROSCOPY WITH MYOSURE N/A 08/06/2019   Procedure: DILATATION & CURETTAGE/HYSTEROSCOPY WITH MYOSURE;  Surgeon: Maxie Better, MD;  Location: [facility];  Service: Gynecology;  Laterality: N/A;   TONSILLECTOMY  04/30/2019   TOOTH EXTRACTION N/A     Family History  Problem Relation Age of Onset   Alcohol abuse Mother    Drug abuse Mother    Bipolar disorder Mother    Depression Mother    Hypertension Paternal Aunt    Hypertension Paternal Grandmother    Prostate  cancer Paternal Grandfather     No Known Allergies  Current Outpatient Medications on File Prior to Visit  Medication Sig Dispense Refill   ibuprofen (ADVIL) 600 MG tablet Take 1 tablet (600 mg total) by mouth every 6 (six) hours as needed. 30 tablet 0   levocetirizine (XYZAL) 5 MG tablet Take 1 tablet by mouth in the evening once daily 30 tablet 3   lisdexamfetamine (VYVANSE) 70 MG capsule Take 1 capsule (70 mg total) by mouth every morning. 30 capsule 0   lisdexamfetamine (VYVANSE) 70 MG capsule Take 1 capsule (70 mg total) by mouth in the morning. 30 capsule 0   montelukast (SINGULAIR) 10 MG tablet Take 10 mg by mouth daily.     Olopatadine HCl 0.2 % SOLN      ondansetron (ZOFRAN) 4 MG tablet Take 1 tablet (4 mg total) by mouth once a day as needed. 30 tablet 0   traZODone (DESYREL) 50 MG tablet Take 1 tablet (50 mg total) by mouth at bedtime for sleep 30 tablet 1   triamcinolone ointment (KENALOG) 0.1 % Apply to affected area(s) twice daily as needed. 454 g 0   Vitamin D, Ergocalciferol, (DRISDOL) 1.25 MG (50000 UNIT) CAPS capsule Take 1 capsule (50,000 Units total) by mouth 2 (two) times a week. 24 capsule 1   Semaglutide,0.25 or 0.5MG /DOS, (OZEMPIC, 0.25 OR 0.5 MG/DOSE,) 2 MG/3ML SOPN Inject 0.25mg  under the skin once weekly. for 60 days (Patient not taking: Reported on 04/14/2023)     No current facility-administered medications on file prior to visit.    BP 110/80   Pulse (!) 57   Temp 98.9 F (37.2 C) (Oral)   Ht 5' 4.5" (1.638 m)   Wt 229 lb (103.9 kg)   LMP 04/11/2023 (Exact Date)   SpO2 99%   BMI 38.70 kg/m       Objective:   Physical Exam Vitals and nursing note reviewed.  Constitutional:      General: She is not in acute distress.    Appearance: Normal appearance. She is obese. She is not ill-appearing.  HENT:     Head: Normocephalic and atraumatic.     Right Ear: Tympanic membrane, ear canal and external ear normal. There is no impacted cerumen.     Left Ear:  Tympanic membrane, ear canal and external ear normal. There is no impacted cerumen.     Nose: Nose normal. No congestion or rhinorrhea.     Mouth/Throat:     Mouth: Mucous membranes are moist.     Pharynx: Oropharynx is clear.  Eyes:     Extraocular Movements: Extraocular movements intact.     Conjunctiva/sclera: Conjunctivae normal.     Pupils: Pupils are equal, round, and reactive to light.  Neck:     Vascular: No carotid bruit.  Cardiovascular:     Rate and Rhythm: Normal rate and regular rhythm.  Pulses: Normal pulses.     Heart sounds: No murmur heard.    No friction rub. No gallop.  Pulmonary:     Effort: Pulmonary effort is normal.     Breath sounds: Normal breath sounds.  Abdominal:     General: Abdomen is flat. Bowel sounds are normal. There is no distension.     Palpations: Abdomen is soft. There is no mass.     Tenderness: There is no abdominal tenderness. There is no guarding or rebound.     Hernia: No hernia is present.  Musculoskeletal:        General: Normal range of motion.     Cervical back: Normal range of motion and neck supple.  Lymphadenopathy:     Cervical: No cervical adenopathy.  Skin:    General: Skin is warm and dry.     Capillary Refill: Capillary refill takes less than 2 seconds.  Neurological:     General: No focal deficit present.     Mental Status: She is alert and oriented to person, place, and time.  Psychiatric:        Mood and Affect: Mood normal.        Behavior: Behavior normal.        Thought Content: Thought content normal.        Judgment: Judgment normal.       Assessment & Plan:  1. Routine general medical examination at a health care facility Today patient counseled on age appropriate routine health concerns for screening and prevention, each reviewed and up to date or declined. Immunizations reviewed and up to date or declined. Labs ordered and reviewed. Risk factors for depression reviewed and negative. Hearing function and  visual acuity are intact. ADLs screened and addressed as needed. Functional ability and level of safety reviewed and appropriate. Education, counseling and referrals performed based on assessed risks today. Patient provided with a copy of personalized plan for preventive services. - Follow up in one year or sooner if needed  - Flu shot given   2. Class 2 obesity - Continue to eat healthy and exercise - Follow up with weight loss clinic as directed  - CBC with Differential/Platelet; Future - Comprehensive metabolic panel; Future - Lipid panel; Future - TSH; Future - VITAMIN D 25 Hydroxy (Vit-D Deficiency, Fractures); Future - Hemoglobin A1c; Future  3. Vitamin D deficiency  - VITAMIN D 25 Hydroxy (Vit-D Deficiency, Fractures); Future  4. Impaired fasting glucose  - CBC with Differential/Platelet; Future - Comprehensive metabolic panel; Future - Lipid panel; Future - TSH; Future - Hemoglobin A1c; Future  5. Seasonal allergies - per allergy and asthma

## 2023-04-18 ENCOUNTER — Other Ambulatory Visit (HOSPITAL_COMMUNITY): Payer: Self-pay

## 2023-04-20 ENCOUNTER — Other Ambulatory Visit (HOSPITAL_COMMUNITY): Payer: Self-pay

## 2023-04-28 ENCOUNTER — Other Ambulatory Visit (HOSPITAL_COMMUNITY): Payer: Self-pay

## 2023-05-02 ENCOUNTER — Other Ambulatory Visit: Payer: Self-pay | Admitting: Adult Health

## 2023-05-02 ENCOUNTER — Encounter: Payer: Self-pay | Admitting: Adult Health

## 2023-05-03 ENCOUNTER — Other Ambulatory Visit: Payer: Self-pay | Admitting: Adult Health

## 2023-05-03 ENCOUNTER — Other Ambulatory Visit (HOSPITAL_COMMUNITY): Payer: Self-pay

## 2023-05-03 ENCOUNTER — Other Ambulatory Visit: Payer: Self-pay

## 2023-05-03 MED ORDER — MONTELUKAST SODIUM 10 MG PO TABS
10.0000 mg | ORAL_TABLET | Freq: Every day | ORAL | 3 refills | Status: AC
  Filled 2023-05-04: qty 90, 90d supply, fill #0

## 2023-05-03 MED ORDER — MONTELUKAST SODIUM 10 MG PO TABS
10.0000 mg | ORAL_TABLET | Freq: Every day | ORAL | 3 refills | Status: DC
  Filled 2023-05-03: qty 1, 1d supply, fill #0

## 2023-05-03 NOTE — Telephone Encounter (Signed)
Please advise 

## 2023-05-04 ENCOUNTER — Other Ambulatory Visit (HOSPITAL_COMMUNITY): Payer: Self-pay

## 2023-05-16 ENCOUNTER — Other Ambulatory Visit (HOSPITAL_COMMUNITY): Payer: Self-pay

## 2023-05-23 ENCOUNTER — Other Ambulatory Visit (HOSPITAL_COMMUNITY): Payer: Self-pay

## 2023-05-23 MED ORDER — LISDEXAMFETAMINE DIMESYLATE 70 MG PO CAPS
70.0000 mg | ORAL_CAPSULE | Freq: Every morning | ORAL | 0 refills | Status: DC
  Filled 2023-05-23 – 2023-06-06 (×2): qty 30, 30d supply, fill #0

## 2023-06-02 ENCOUNTER — Other Ambulatory Visit (HOSPITAL_COMMUNITY): Payer: Self-pay

## 2023-06-06 ENCOUNTER — Other Ambulatory Visit (HOSPITAL_COMMUNITY): Payer: Self-pay

## 2023-06-13 ENCOUNTER — Other Ambulatory Visit (HOSPITAL_COMMUNITY): Payer: Self-pay

## 2023-07-12 ENCOUNTER — Other Ambulatory Visit (HOSPITAL_COMMUNITY): Payer: Self-pay

## 2023-07-12 MED ORDER — LISDEXAMFETAMINE DIMESYLATE 70 MG PO CAPS
70.0000 mg | ORAL_CAPSULE | Freq: Every morning | ORAL | 0 refills | Status: DC
  Filled 2023-07-12: qty 30, 30d supply, fill #0

## 2023-07-23 ENCOUNTER — Other Ambulatory Visit (HOSPITAL_COMMUNITY): Payer: Self-pay

## 2023-08-10 ENCOUNTER — Other Ambulatory Visit (HOSPITAL_COMMUNITY): Payer: Self-pay

## 2023-08-10 MED ORDER — LISDEXAMFETAMINE DIMESYLATE 70 MG PO CAPS
70.0000 mg | ORAL_CAPSULE | Freq: Every morning | ORAL | 0 refills | Status: DC
  Filled 2023-08-10: qty 30, 30d supply, fill #0

## 2023-08-19 ENCOUNTER — Other Ambulatory Visit (HOSPITAL_COMMUNITY): Payer: Self-pay

## 2023-08-19 MED ORDER — VITAMIN D (ERGOCALCIFEROL) 1.25 MG (50000 UNIT) PO CAPS
50000.0000 [IU] | ORAL_CAPSULE | ORAL | 0 refills | Status: DC
  Filled 2023-08-19: qty 24, 84d supply, fill #0

## 2023-08-26 ENCOUNTER — Other Ambulatory Visit (HOSPITAL_COMMUNITY): Payer: Self-pay

## 2023-08-26 ENCOUNTER — Ambulatory Visit: Payer: Self-pay | Admitting: Adult Health

## 2023-08-26 VITALS — BP 120/88 | HR 78 | Temp 97.6°F | Ht 64.5 in | Wt 236.0 lb

## 2023-08-26 DIAGNOSIS — J069 Acute upper respiratory infection, unspecified: Secondary | ICD-10-CM | POA: Diagnosis not present

## 2023-08-26 MED ORDER — PREDNISONE 10 MG PO TABS
ORAL_TABLET | ORAL | 0 refills | Status: AC
  Filled 2023-08-26: qty 21, 9d supply, fill #0

## 2023-08-26 MED ORDER — HYDROCODONE BIT-HOMATROP MBR 5-1.5 MG/5ML PO SOLN
5.0000 mL | Freq: Three times a day (TID) | ORAL | 0 refills | Status: AC | PRN
Start: 2023-08-26 — End: ?
  Filled 2023-08-26: qty 120, 8d supply, fill #0

## 2023-08-26 MED ORDER — BENZONATATE 200 MG PO CAPS
200.0000 mg | ORAL_CAPSULE | Freq: Three times a day (TID) | ORAL | 0 refills | Status: AC | PRN
Start: 2023-08-26 — End: ?
  Filled 2023-08-26: qty 30, 10d supply, fill #0

## 2023-08-26 NOTE — Progress Notes (Signed)
 Subjective:    Patient ID: Lauren Powell, female    DOB: Jan 09, 1986, 38 y.o.   MRN: [id number]  URI  This is a new problem. The current episode started 1 to 4 weeks ago (2 weeks). The problem has been unchanged. There has been no fever. Associated symptoms include congestion, coughing (non productive), ear pain, a rash, rhinorrhea (clear) and wheezing. Pertinent negatives include no joint pain, nausea, sinus pain, sore throat or swollen glands. She has tried decongestant for the symptoms. The treatment provided no relief.      Review of Systems  HENT:  Positive for congestion, ear pain and rhinorrhea (clear). Negative for sinus pain and sore throat.   Respiratory:  Positive for cough (non productive) and wheezing.   Gastrointestinal:  Negative for nausea.  Musculoskeletal:  Negative for joint pain.  Skin:  Positive for rash.   Past Medical History:  Diagnosis Date   Allergy    Eczema    Female infertility    History of anemia    Obesity    Vitamin D deficiency     Social History   Socioeconomic History   Marital status: Divorced    Spouse name: Not on file   Number of children: Not on file   Years of education: Not on file   Highest education level: Not on file  Occupational History   Occupation: Midwife  Tobacco Use   Smoking status: Never   Smokeless tobacco: Never  Vaping Use   Vaping status: Never Used  Substance and Sexual Activity   Alcohol use: No   Drug use: No   Sexual activity: Yes    Partners: Male    Birth control/protection: None  Other Topics Concern   Not on file  Social History Narrative   Patient lives with son   Teaches pre school and kindergarten.    Social Drivers of Corporate investment banker Strain: Not on file  Food Insecurity: Not on file  Transportation Needs: Not on file  Physical Activity: Not on file  Stress: Not on file  Social Connections: Not on file  Intimate Partner Violence: Not on file    Past Surgical  History:  Procedure Laterality Date   CESAREAN SECTION     DILATATION & CURETTAGE/HYSTEROSCOPY WITH MYOSURE N/A 08/06/2019   Procedure: DILATATION & CURETTAGE/HYSTEROSCOPY WITH MYOSURE;  Surgeon: Maxie Better, MD;  Location: [facility];  Service: Gynecology;  Laterality: N/A;   TONSILLECTOMY  04/30/2019   TOOTH EXTRACTION N/A     Family History  Problem Relation Age of Onset   Alcohol abuse Mother    Drug abuse Mother    Bipolar disorder Mother    Depression Mother    Hypertension Paternal Aunt    Hypertension Paternal Grandmother    Prostate cancer Paternal Grandfather     No Known Allergies  Current Outpatient Medications on File Prior to Visit  Medication Sig Dispense Refill   ibuprofen (ADVIL) 600 MG tablet Take 1 tablet (600 mg total) by mouth every 6 (six) hours as needed. 30 tablet 0   levocetirizine (XYZAL) 5 MG tablet Take 1 tablet (5 mg total) by mouth every evening. 30 tablet 3   lisdexamfetamine (VYVANSE) 70 MG capsule Take 1 capsule (70 mg total) by mouth every morning. 30 capsule 0   lisdexamfetamine (VYVANSE) 70 MG capsule Take 1 capsule (70 mg total) by mouth in the morning. 30 capsule 0   lisdexamfetamine (VYVANSE) 70 MG capsule Take 1 capsule (  70 mg total) by mouth in the morning. 30 capsule 0   montelukast (SINGULAIR) 10 MG tablet Take 1 tablet (10 mg total) by mouth daily. 90 tablet 3   Olopatadine HCl 0.2 % SOLN      ondansetron (ZOFRAN) 4 MG tablet Take 1 tablet (4 mg total) by mouth once a day as needed. 30 tablet 0   traZODone (DESYREL) 50 MG tablet Take 1 tablet (50 mg total) by mouth at bedtime for sleep 30 tablet 1   triamcinolone ointment (KENALOG) 0.1 % Apply to affected area(s) twice daily as needed. 454 g 0   Vitamin D, Ergocalciferol, (DRISDOL) 1.25 MG (50000 UNIT) CAPS capsule Take 1 capsule (50,000 Units total) by mouth 2 (two) times a week. 24 capsule 1   Vitamin D, Ergocalciferol, (DRISDOL) 1.25 MG (50000 UNIT) CAPS capsule  Take 1 capsule (50,000 Units total) by mouth 2 (two) times a week. 24 capsule 0   No current facility-administered medications on file prior to visit.    BP 120/88   Pulse 78   Temp 97.6 F (36.4 C) (Oral)   Ht 5' 4.5" (1.638 m)   Wt 236 lb (107 kg)   SpO2 97%   BMI 39.88 kg/m       Objective:   Physical Exam Vitals and nursing note reviewed.  Constitutional:      Appearance: Normal appearance.  HENT:     Right Ear: A middle ear effusion is present. Tympanic membrane is not erythematous.     Left Ear: A middle ear effusion is present. Tympanic membrane is not erythematous.     Nose: Rhinorrhea present. No congestion. Rhinorrhea is clear.     Right Turbinates: Not enlarged or swollen.     Left Turbinates: Not enlarged or swollen.     Right Sinus: No maxillary sinus tenderness or frontal sinus tenderness.     Left Sinus: No maxillary sinus tenderness or frontal sinus tenderness.     Mouth/Throat:     Mouth: Mucous membranes are moist.     Pharynx: Oropharynx is clear. No oropharyngeal exudate or posterior oropharyngeal erythema.  Cardiovascular:     Rate and Rhythm: Normal rate and regular rhythm.     Pulses: Normal pulses.     Heart sounds: Normal heart sounds.  Pulmonary:     Effort: Pulmonary effort is normal.     Breath sounds: Wheezing (trace throughout) present.  Musculoskeletal:        General: Normal range of motion.  Skin:    General: Skin is warm and dry.  Neurological:     General: No focal deficit present.     Mental Status: She is alert and oriented to person, place, and time.  Psychiatric:        Mood and Affect: Mood normal.        Behavior: Behavior normal.        Thought Content: Thought content normal.        Judgment: Judgment normal.        Assessment & Plan:  1. Upper respiratory tract infection, unspecified type (Primary) - Likely viral or allergic. Will treat with prednisone, Hycodan and tessalon. Follow up if not improving in the next  3-4 days or sooner if symptoms worsen  - predniSONE (DELTASONE) 10 MG tablet; 40 mg x 3 days, 20 mg x 3 days, 10 mg x 3 days  Dispense: 21 tablet; Refill: 0 - HYDROcodone bit-homatropine (HYCODAN) 5-1.5 MG/5ML syrup; Take 5 mLs by mouth every 8 (  eight) hours as needed for cough.  Dispense: 120 mL; Refill: 0 - benzonatate (TESSALON) 200 MG capsule; Take 1 capsule (200 mg total) by mouth 3 (three) times daily as needed.  Dispense: 30 capsule; Refill: 0  Shirline Frees, NP  Time spent with patient today was 31 minutes which consisted of chart review, discussing respiratory infections, work up, treatment, listening,  answering questions and documentation.

## 2023-09-08 ENCOUNTER — Other Ambulatory Visit (HOSPITAL_COMMUNITY): Payer: Self-pay

## 2023-09-08 MED ORDER — SCOPOLAMINE 1 MG/3DAYS TD PT72
1.0000 | MEDICATED_PATCH | TRANSDERMAL | 0 refills | Status: DC | PRN
  Filled 2023-09-08: qty 2, 5d supply, fill #0

## 2023-09-08 MED ORDER — LISDEXAMFETAMINE DIMESYLATE 70 MG PO CAPS
70.0000 mg | ORAL_CAPSULE | Freq: Every morning | ORAL | 0 refills | Status: DC
  Filled 2023-09-09: qty 30, 30d supply, fill #0

## 2023-09-08 MED ORDER — MONTELUKAST SODIUM 10 MG PO TABS
10.0000 mg | ORAL_TABLET | Freq: Every day | ORAL | 3 refills | Status: DC
  Filled 2023-09-08: qty 30, 30d supply, fill #0

## 2023-09-08 MED ORDER — LEVOCETIRIZINE DIHYDROCHLORIDE 5 MG PO TABS
5.0000 mg | ORAL_TABLET | Freq: Every evening | ORAL | 3 refills | Status: DC
  Filled 2023-09-08: qty 30, 30d supply, fill #0

## 2023-09-09 ENCOUNTER — Other Ambulatory Visit (HOSPITAL_COMMUNITY): Payer: Self-pay

## 2023-12-02 ENCOUNTER — Other Ambulatory Visit (HOSPITAL_COMMUNITY): Payer: Self-pay

## 2023-12-05 ENCOUNTER — Other Ambulatory Visit: Payer: Self-pay

## 2023-12-05 ENCOUNTER — Other Ambulatory Visit (HOSPITAL_COMMUNITY): Payer: Self-pay

## 2023-12-05 MED ORDER — LISDEXAMFETAMINE DIMESYLATE 70 MG PO CAPS: 70.0000 mg | ORAL_CAPSULE | Freq: Every morning | ORAL | 0 refills | Status: DC

## 2023-12-05 MED ORDER — LISDEXAMFETAMINE DIMESYLATE 70 MG PO CAPS
70.0000 mg | ORAL_CAPSULE | Freq: Every morning | ORAL | 0 refills | Status: DC
  Filled 2023-12-05: qty 30, 30d supply, fill #0

## 2023-12-05 MED ORDER — LISDEXAMFETAMINE DIMESYLATE 70 MG PO CAPS
70.0000 mg | ORAL_CAPSULE | Freq: Every morning | ORAL | 0 refills | Status: DC
  Filled 2024-04-15: qty 30, 30d supply, fill #0

## 2023-12-10 ENCOUNTER — Other Ambulatory Visit (HOSPITAL_COMMUNITY): Payer: Self-pay

## 2024-04-15 ENCOUNTER — Other Ambulatory Visit (HOSPITAL_COMMUNITY): Payer: Self-pay

## 2024-04-19 ENCOUNTER — Ambulatory Visit: Admitting: Allergy

## 2024-05-17 ENCOUNTER — Ambulatory Visit: Admitting: Adult Health

## 2024-05-17 ENCOUNTER — Ambulatory Visit: Payer: Self-pay | Admitting: Adult Health

## 2024-05-17 ENCOUNTER — Encounter: Payer: Self-pay | Admitting: Adult Health

## 2024-05-17 ENCOUNTER — Other Ambulatory Visit (HOSPITAL_COMMUNITY): Payer: Self-pay

## 2024-05-17 ENCOUNTER — Other Ambulatory Visit: Payer: Self-pay

## 2024-05-17 VITALS — BP 120/80 | HR 81 | Temp 97.5°F | Ht 64.5 in | Wt 253.0 lb

## 2024-05-17 DIAGNOSIS — Z23 Encounter for immunization: Secondary | ICD-10-CM

## 2024-05-17 DIAGNOSIS — J302 Other seasonal allergic rhinitis: Secondary | ICD-10-CM

## 2024-05-17 DIAGNOSIS — E559 Vitamin D deficiency, unspecified: Secondary | ICD-10-CM

## 2024-05-17 DIAGNOSIS — Z0000 Encounter for general adult medical examination without abnormal findings: Secondary | ICD-10-CM

## 2024-05-17 DIAGNOSIS — E66813 Obesity, class 3: Secondary | ICD-10-CM

## 2024-05-17 DIAGNOSIS — R7301 Impaired fasting glucose: Secondary | ICD-10-CM

## 2024-05-17 DIAGNOSIS — F5101 Primary insomnia: Secondary | ICD-10-CM

## 2024-05-17 LAB — COMPREHENSIVE METABOLIC PANEL WITH GFR
ALT: 12 U/L (ref 0–35)
AST: 15 U/L (ref 0–37)
Albumin: 4.2 g/dL (ref 3.5–5.2)
Alkaline Phosphatase: 60 U/L (ref 39–117)
BUN: 10 mg/dL (ref 6–23)
CO2: 32 meq/L (ref 19–32)
Calcium: 9.3 mg/dL (ref 8.4–10.5)
Chloride: 102 meq/L (ref 96–112)
Creatinine, Ser: 0.85 mg/dL (ref 0.40–1.20)
GFR: 86.21 mL/min (ref >60.00)
Glucose, Bld: 81 mg/dL (ref 70–99)
Potassium: 3.6 meq/L (ref 3.5–5.1)
Sodium: 139 meq/L (ref 135–145)
Total Bilirubin: 0.6 mg/dL (ref 0.2–1.2)
Total Protein: 7.3 g/dL (ref 6.0–8.3)

## 2024-05-17 LAB — LIPID PANEL
Cholesterol: 163 mg/dL (ref 0–200)
HDL: 67.7 mg/dL (ref >39.00)
LDL Cholesterol: 86 mg/dL (ref 0–99)
NonHDL: 95.29
Total CHOL/HDL Ratio: 2
Triglycerides: 46 mg/dL (ref 0.0–149.0)
VLDL: 9.2 mg/dL (ref 0.0–40.0)

## 2024-05-17 LAB — CBC WITH DIFFERENTIAL/PLATELET
Basophils Absolute: 0.1 K/uL (ref 0.0–0.1)
Basophils Relative: 0.9 % (ref 0.0–3.0)
Eosinophils Absolute: 0.3 K/uL (ref 0.0–0.7)
Eosinophils Relative: 4 % (ref 0.0–5.0)
HCT: 39.3 % (ref 36.0–46.0)
Hemoglobin: 12.9 g/dL (ref 12.0–15.0)
Lymphocytes Relative: 24.9 % (ref 12.0–46.0)
Lymphs Abs: 1.9 K/uL (ref 0.7–4.0)
MCHC: 32.7 g/dL (ref 30.0–36.0)
MCV: 82 fl (ref 78.0–100.0)
Monocytes Absolute: 0.4 K/uL (ref 0.1–1.0)
Monocytes Relative: 5.2 % (ref 3.0–12.0)
Neutro Abs: 4.9 K/uL (ref 1.4–7.7)
Neutrophils Relative %: 65 % (ref 43.0–77.0)
Platelets: 316 K/uL (ref 150.0–400.0)
RBC: 4.79 Mil/uL (ref 3.87–5.11)
RDW: 14.9 % (ref 11.5–15.5)
WBC: 7.5 K/uL (ref 4.0–10.5)

## 2024-05-17 LAB — VITAMIN D 25 HYDROXY (VIT D DEFICIENCY, FRACTURES): VITD: 14.94 ng/mL — ABNORMAL LOW (ref 30.00–100.00)

## 2024-05-17 LAB — HEMOGLOBIN A1C: Hgb A1c MFr Bld: 5.5 % (ref 4.6–6.5)

## 2024-05-17 LAB — TSH: TSH: 0.94 u[IU]/mL (ref 0.35–5.50)

## 2024-05-17 MED ORDER — LISDEXAMFETAMINE DIMESYLATE 70 MG PO CAPS
70.0000 mg | ORAL_CAPSULE | Freq: Every morning | ORAL | 0 refills | Status: AC
  Filled 2024-05-17: qty 30, 30d supply, fill #0

## 2024-05-17 MED ORDER — TRAZODONE HCL 50 MG PO TABS
50.0000 mg | ORAL_TABLET | Freq: Every day | ORAL | 1 refills | Status: AC
  Filled 2024-05-17: qty 30, 30d supply, fill #0

## 2024-05-17 NOTE — Progress Notes (Signed)
 Subjective:    Patient ID: Lauren Powell, female    DOB: 1985-12-11, 38 y.o.   MRN: [id number]  HPI Patient presents for yearly preventative medicine examination. She is a pleasant 38 year old female who  has a past medical history of Allergy, Eczema, Female infertility, History of anemia, Obesity, and Vitamin D  deficiency.  Weight loss management Currently prescribed Vyvance 70 mg daily as Ozempic  stopped being covered.  She started at 285 lbs. She is walking about a mile a day and is eating healthy.  She will no longer be seeing Dr. Prentiss since she left the practice and would like me to take over her Vyvance prescription. She feels well overall and is happy with her weight loss.  Wt Readings from Last 3 Encounters:  05/17/24 253 lb (114.8 kg)  08/26/23 236 lb (107 kg)  04/14/23 229 lb (103.9 kg)   Vitamin D  deficiency- not currently on medication. She was on high dose vitamin D  in the past but this did not help her numbers.   Impaired fasting glucose - managed with weight loss  Lab Results  Component Value Date   HGBA1C 5.6 04/14/2023   HGBA1C 5.7 03/31/2022   HGBA1C 5.7 (H) 08/11/2021    Sesonal Allergies - managed by [facility]. Currently prescribed Singulair  and Astelin  nasal spray. Her allergies have not been controlled. Is going over to allergy and asthma after this visit   Insomnia -managed with Trazodone  50 mg at bedtime PRN  All immunizations and health maintenance protocols were reviewed with the patient and needed orders were placed.  Appropriate screening laboratory values were ordered for the patient including screening of hyperlipidemia, renal function and hepatic function.  Medication reconciliation,  past medical history, social history, problem list and allergies were reviewed in detail with the patient  Goals were established with regard to weight loss, exercise, and  diet in compliance with medications  She has her pap scheduled.   She has  no acute complaints.   Review of Systems  Constitutional: Negative.   HENT: Negative.    Eyes: Negative.   Respiratory: Negative.    Cardiovascular: Negative.   Gastrointestinal: Negative.   Endocrine: Negative.   Genitourinary: Negative.   Musculoskeletal: Negative.   Skin: Negative.   Allergic/Immunologic: Negative.   Neurological: Negative.   Hematological: Negative.   Psychiatric/Behavioral: Negative.     Past Medical History:  Diagnosis Date   Allergy    Eczema    Female infertility    History of anemia    Obesity    Vitamin D  deficiency     Social History   Socioeconomic History   Marital status: Divorced    Spouse name: Not on file   Number of children: Not on file   Years of education: Not on file   Highest education level: Not on file  Occupational History   Occupation: Midwife  Tobacco Use   Smoking status: Never   Smokeless tobacco: Never  Vaping Use   Vaping status: Never Used  Substance and Sexual Activity   Alcohol use: No   Drug use: No   Sexual activity: Yes    Partners: Male    Birth control/protection: None  Other Topics Concern   Not on file  Social History Narrative   Patient lives with son   Teaches pre school and kindergarten.    Social Drivers of Health   Tobacco Use: Low Risk (05/17/2024)   Patient History    Smoking  Tobacco Use: Never    Smokeless Tobacco Use: Never    Passive Exposure: Not on file  Financial Resource Strain: Not on file  Food Insecurity: Not on file  Transportation Needs: Not on file  Physical Activity: Not on file  Stress: Not on file  Social Connections: Not on file  Intimate Partner Violence: Not on file  Depression (PHQ2-9): Low Risk (03/31/2022)   Depression (PHQ2-9)    PHQ-2 Score: 0  Alcohol Screen: Not on file  Housing: Not on file  Utilities: Not on file  Health Literacy: Not on file    Past Surgical History:  Procedure Laterality Date   CESAREAN SECTION     DILATATION &  CURETTAGE/HYSTEROSCOPY WITH MYOSURE N/A 08/06/2019   Procedure: DILATATION & CURETTAGE/HYSTEROSCOPY WITH MYOSURE;  Surgeon: Rutherford Gain, MD;  Location: [facility];  Service: Gynecology;  Laterality: N/A;   TONSILLECTOMY  04/30/2019   TOOTH EXTRACTION N/A     Family History  Problem Relation Age of Onset   Alcohol abuse Mother    Drug abuse Mother    Bipolar disorder Mother    Depression Mother    Hypertension Paternal Aunt    Hypertension Paternal Grandmother    Prostate cancer Paternal Grandfather     Allergies[1]  Medications Ordered Prior to Encounter[2]  BP 120/80   Pulse 81   Temp (!) 97.5 F (36.4 C) (Oral)   Ht 5' 4.5 (1.638 m)   Wt 253 lb (114.8 kg)   LMP 04/30/2024 (Exact Date)   SpO2 98%   BMI 42.76 kg/m       Objective:   Physical Exam Vitals and nursing note reviewed.  Constitutional:      General: She is not in acute distress.    Appearance: Normal appearance. She is obese. She is not ill-appearing.  HENT:     Head: Normocephalic and atraumatic.     Right Ear: Tympanic membrane, ear canal and external ear normal. There is no impacted cerumen.     Left Ear: Tympanic membrane, ear canal and external ear normal. There is no impacted cerumen.     Nose: Nose normal. No congestion or rhinorrhea.     Mouth/Throat:     Mouth: Mucous membranes are moist.     Pharynx: Oropharynx is clear.  Eyes:     Extraocular Movements: Extraocular movements intact.     Conjunctiva/sclera: Conjunctivae normal.     Pupils: Pupils are equal, round, and reactive to light.  Neck:     Vascular: No carotid bruit.  Cardiovascular:     Rate and Rhythm: Normal rate and regular rhythm.     Pulses: Normal pulses.     Heart sounds: No murmur heard.    No friction rub. No gallop.  Pulmonary:     Effort: Pulmonary effort is normal.     Breath sounds: Normal breath sounds.  Abdominal:     General: Abdomen is flat. Bowel sounds are normal. There is no  distension.     Palpations: Abdomen is soft. There is no mass.     Tenderness: There is no abdominal tenderness. There is no guarding or rebound.     Hernia: No hernia is present.  Musculoskeletal:        General: Normal range of motion.     Cervical back: Normal range of motion and neck supple.  Lymphadenopathy:     Cervical: No cervical adenopathy.  Skin:    General: Skin is warm and dry.  Capillary Refill: Capillary refill takes less than 2 seconds.  Neurological:     General: No focal deficit present.     Mental Status: She is alert and oriented to person, place, and time.  Psychiatric:        Mood and Affect: Mood normal.        Behavior: Behavior normal.        Thought Content: Thought content normal.        Judgment: Judgment normal.           Assessment & Plan:  1. Routine general medical examination at a health care facility (Primary) Today patient counseled on age appropriate routine health concerns for screening and prevention, each reviewed and up to date or declined. Immunizations reviewed and up to date or declined. Labs ordered and reviewed. Risk factors for depression reviewed and negative. Hearing function and visual acuity are intact. ADLs screened and addressed as needed. Functional ability and level of safety reviewed and appropriate. Education, counseling and referrals performed based on assessed risks today. Patient provided with a copy of personalized plan for preventive services.  2. Class 3 obesity (HCC) - Continue to eat healthy and exercise - We will take over her prescriptions  - Follow up in 3 months  - CBC with Differential/Platelet; Future - Comprehensive metabolic panel with GFR; Future - Lipid panel; Future - TSH; Future - Hemoglobin A1c; Future - lisdexamfetamine (VYVANSE ) 70 MG capsule; Take 1 capsule by mouth once daily every morning.  Dispense: 30 capsule; Refill: 0 - lisdexamfetamine (VYVANSE ) 70 MG capsule; Take 1 capsule (70 mg  total) by mouth every morning.  Dispense: 30 capsule; Refill: 0 - lisdexamfetamine (VYVANSE ) 70 MG capsule; Take 1 capsule (70 mg total) by mouth every morning.  Dispense: 30 capsule; Refill: 0  3. Vitamin D  deficiency  - VITAMIN D  25 Hydroxy (Vit-D Deficiency, Fractures); Future  4. Impaired fasting glucose  - CBC with Differential/Platelet; Future - Comprehensive metabolic panel with GFR; Future - Lipid panel; Future - TSH; Future - - Hemoglobin A1c; Future 5. Seasonal allergies - Per allergy and asthma   6. Primary insomnia - Continue with Trazodone  50 mg at bedtime PRN  - CBC with Differential/Platelet; Future - Comprehensive metabolic panel with GFR; Future - Lipid panel; Future - TSH; Future - traZODone  (DESYREL ) 50 MG tablet; Take 1 tablet (50 mg total) by mouth at bedtime for sleep  Dispense: 30 tablet; Refill: 1  7. Need for influenza vaccination  - Flu vaccine trivalent PF, 6mos and older(Flulaval,Afluria,Fluarix,Fluzone)  8. Need for HPV vaccination  - HPV 9-valent vaccine,Recombinat  Darleene Shape, NP     [1] No Known Allergies [2]  Current Outpatient Medications on File Prior to Visit  Medication Sig Dispense Refill   ibuprofen  (ADVIL ) 600 MG tablet Take 1 tablet (600 mg total) by mouth every 6 (six) hours as needed. 30 tablet 0   levocetirizine (XYZAL ) 5 MG tablet Take 1 tablet (5 mg total) by mouth every evening. 30 tablet 3   lisdexamfetamine (VYVANSE ) 70 MG capsule Take 1 capsule (70 mg total) by mouth every morning. 30 capsule 0   lisdexamfetamine (VYVANSE ) 70 MG capsule Take 1 capsule (70 mg total) by mouth in the morning. 30 capsule 0   lisdexamfetamine (VYVANSE ) 70 MG capsule Take 1 capsule (70 mg total) by mouth in the morning. 30 capsule 0   lisdexamfetamine (VYVANSE ) 70 MG capsule Take 1 capsule (70 mg total) by mouth in the morning. 30 capsule 0  lisdexamfetamine (VYVANSE ) 70 MG capsule Take 1 capsule by mouth once daily in the morning (rx 2 of  3) Fill date of 12/30/2023 30 capsule 0   lisdexamfetamine (VYVANSE ) 70 MG capsule Take 1 capsule by mouth once daily every morning. 30 capsule 0   lisdexamfetamine (VYVANSE ) 70 MG capsule Take 1 capsule (70 mg total) by mouth every morning. 30 capsule 0   lisdexamfetamine (VYVANSE ) 70 MG capsule Take 1 capsule (70 mg total) by mouth every morning. 30 capsule 0   montelukast  (SINGULAIR ) 10 MG tablet Take 1 tablet (10 mg total) by mouth daily. 90 tablet 3   traZODone  (DESYREL ) 50 MG tablet Take 1 tablet (50 mg total) by mouth at bedtime for sleep 30 tablet 1   triamcinolone  ointment (KENALOG ) 0.1 % Apply to affected area(s) twice daily as needed. 454 g 0   budesonide-formoterol (SYMBICORT) 80-4.5 MCG/ACT inhaler 2 puffs Inhalation Q4HRS as needed up to 4X/day for cough/wheeze; Duration: 30 days     EPINEPHrine (AUVI-Q) 0.3 mg/0.3 mL IJ SOAJ injection as directed Injection as needed for systemic reactions; Duration: 30 days     Olopatadine  HCl (PATADAY ) 0.2 % SOLN 1 drop into affected eye Ophthalmic-OTC Once a day as needed; Duration: 30 days     No current facility-administered medications on file prior to visit.

## 2024-05-18 ENCOUNTER — Other Ambulatory Visit (HOSPITAL_COMMUNITY): Payer: Self-pay

## 2024-05-26 ENCOUNTER — Other Ambulatory Visit (HOSPITAL_COMMUNITY): Payer: Self-pay

## 2024-06-14 ENCOUNTER — Ambulatory Visit: Admitting: Allergy & Immunology

## 2024-08-08 ENCOUNTER — Ambulatory Visit: Admitting: Adult Health
